# Patient Record
Sex: Female | Born: 1950 | Race: White | Hispanic: No | Marital: Married | Smoking: Never smoker
Health system: Southern US, Community
[De-identification: ages and names within clinical notes are randomized; demographics above are authoritative.]

## PROBLEM LIST (undated history)

## (undated) DIAGNOSIS — I471 Supraventricular tachycardia, unspecified: Secondary | ICD-10-CM

## (undated) DIAGNOSIS — I10 Essential (primary) hypertension: Secondary | ICD-10-CM

## (undated) DIAGNOSIS — R1310 Dysphagia, unspecified: Secondary | ICD-10-CM

## (undated) DIAGNOSIS — M1990 Unspecified osteoarthritis, unspecified site: Secondary | ICD-10-CM

## (undated) DIAGNOSIS — M4802 Spinal stenosis, cervical region: Secondary | ICD-10-CM

## (undated) DIAGNOSIS — T7840XA Allergy, unspecified, initial encounter: Secondary | ICD-10-CM

## (undated) DIAGNOSIS — K219 Gastro-esophageal reflux disease without esophagitis: Secondary | ICD-10-CM

## (undated) DIAGNOSIS — R112 Nausea with vomiting, unspecified: Secondary | ICD-10-CM

## (undated) DIAGNOSIS — Z9889 Other specified postprocedural states: Secondary | ICD-10-CM

## (undated) DIAGNOSIS — M810 Age-related osteoporosis without current pathological fracture: Secondary | ICD-10-CM

## (undated) DIAGNOSIS — E782 Mixed hyperlipidemia: Secondary | ICD-10-CM

## (undated) DIAGNOSIS — M4316 Spondylolisthesis, lumbar region: Secondary | ICD-10-CM

## (undated) HISTORY — DX: Mixed hyperlipidemia: E78.2

## (undated) HISTORY — PX: ABDOMINAL HYSTERECTOMY: SHX81

## (undated) HISTORY — PX: COLONOSCOPY: SHX174

## (undated) HISTORY — DX: Age-related osteoporosis without current pathological fracture: M81.0

## (undated) HISTORY — DX: Dysphagia, unspecified: R13.10

## (undated) HISTORY — DX: Essential (primary) hypertension: I10

## (undated) HISTORY — DX: Spinal stenosis, cervical region: M48.02

## (undated) HISTORY — DX: Allergy, unspecified, initial encounter: T78.40XA

## (undated) HISTORY — PX: NO PAST SURGERIES: SHX2092

---

## 2003-08-14 HISTORY — PX: ESOPHAGEAL DILATION: SHX303

## 2013-06-24 DIAGNOSIS — M25559 Pain in unspecified hip: Secondary | ICD-10-CM | POA: Insufficient documentation

## 2013-06-24 DIAGNOSIS — M48061 Spinal stenosis, lumbar region without neurogenic claudication: Secondary | ICD-10-CM | POA: Diagnosis present

## 2016-01-17 ENCOUNTER — Other Ambulatory Visit: Payer: Self-pay | Admitting: Urology

## 2016-02-24 ENCOUNTER — Other Ambulatory Visit: Payer: Self-pay | Admitting: Obstetrics and Gynecology

## 2016-03-14 NOTE — Patient Instructions (Addendum)
Crystal Oneal  03/14/2016   Your procedure is scheduled on: 03/20/2016    Report to [facility]  Entrance take [place]  elevators to 3rd floor to  [place] at    [place] AM.  Call this number if you have problems the morning of surgery [phone]   Remember: ONLY 1 PERSON MAY GO WITH YOU TO SHORT STAY TO GET  READY MORNING OF [name]  Do not eat food or drink liquids :After Midnight.              Fleets enema nite before surgery      Take these medicines the morning of surgery with A SIP OF WATER: none                                 You may not have any metal on your body including hair pins and              piercings  Do not wear jewelry, make-up, lotions, powders or perfumes, deodorant             Do not wear nail polish.  Do not shave  48 hours prior to surgery.                 Do not bring valuables to the hospital. [place].  Contacts, dentures or bridgework may not be worn into surgery.  Leave suitcase in the car. After surgery it may be brought to your room.   _____________________________________________________________________             [facility] - Preparing for Surgery Before surgery, you can play an important role.  Because skin is not sterile, your skin needs to be as free of germs as possible.  You can reduce the number of germs on your skin by washing with CHG (chlorahexidine gluconate) soap before surgery.  CHG is an antiseptic cleaner which kills germs and bonds with the skin to continue killing germs even after washing. Please DO NOT use if you have an allergy to CHG or antibacterial soaps.  If your skin becomes reddened/irritated stop using the CHG and inform your nurse when you arrive at Short Stay. Do not shave (including legs and underarms) for at least 48 hours prior to the first CHG shower.  You may shave your face/neck. Please follow these instructions  carefully:  1.  Shower with CHG Soap the night before surgery and the  morning of Surgery.  2.  If you choose to wash your hair, wash your hair first as usual with your  normal  shampoo.  3.  After you shampoo, rinse your hair and body thoroughly to remove the  shampoo.                           4.  Use CHG as you would any other liquid soap.  You can apply chg directly  to the skin and wash                       Gently with a scrungie or clean washcloth.  5.  Apply the CHG Soap to your body ONLY FROM THE  NECK DOWN.   Do not use on face/ open                           Wound or open sores. Avoid contact with eyes, ears mouth and genitals (private parts).                       Wash face,  Genitals (private parts) with your normal soap.             6.  Wash thoroughly, paying special attention to the area where your surgery  will be performed.  7.  Thoroughly rinse your body with warm water from the neck down.  8.  DO NOT shower/wash with your normal soap after using and rinsing off  the CHG Soap.                9.  Pat yourself dry with a clean towel.            10.  Wear clean pajamas.            11.  Place clean sheets on your bed the night of your first shower and do not  sleep with pets. Day of Surgery : Do not apply any lotions/deodorants the morning of surgery.  Please wear clean clothes to the hospital/surgery center.  FAILURE TO FOLLOW THESE INSTRUCTIONS MAY RESULT IN THE CANCELLATION OF YOUR SURGERY PATIENT SIGNATURE_________________________________  NURSE SIGNATURE__________________________________  ________________________________________________________________________  WHAT IS A BLOOD TRANSFUSION? Blood Transfusion Information  A transfusion is the replacement of blood or some of its parts. Blood is made up of multiple cells which provide different functions.  Red blood cells carry oxygen and are used for blood loss replacement.  White blood cells fight against  infection.  Platelets control bleeding.  Plasma helps clot blood.  Other blood products are available for specialized needs, such as hemophilia or other clotting disorders. BEFORE THE TRANSFUSION  Who gives blood for transfusions?   Healthy volunteers who are fully evaluated to make sure their blood is safe. This is blood bank blood. Transfusion therapy is the safest it has ever been in the practice of medicine. Before blood is taken from a donor, a complete history is taken to make sure that person has no history of diseases nor engages in risky social behavior (examples are intravenous drug use or sexual activity with multiple partners). The donor's travel history is screened to minimize risk of transmitting infections, such as malaria. The donated blood is tested for signs of infectious diseases, such as HIV and hepatitis. The blood is then tested to be sure it is compatible with you in order to minimize the chance of a transfusion reaction. If you or a relative donates blood, this is often done in anticipation of surgery and is not appropriate for emergency situations. It takes many days to process the donated blood. RISKS AND COMPLICATIONS Although transfusion therapy is very safe and saves many lives, the main dangers of transfusion include:   Getting an infectious disease.  Developing a transfusion reaction. This is an allergic reaction to something in the blood you were given. Every precaution is taken to prevent this. The decision to have a blood transfusion has been considered carefully by your caregiver before blood is given. Blood is not given unless the benefits outweigh the risks. AFTER THE TRANSFUSION  Right after receiving a blood transfusion, you will usually feel much better and more  energetic. This is especially true if your red blood cells have gotten low (anemic). The transfusion raises the level of the red blood cells which carry oxygen, and this usually causes an energy  increase.  The nurse administering the transfusion will monitor you carefully for complications. HOME CARE INSTRUCTIONS  No special instructions are needed after a transfusion. You may find your energy is better. Speak with your caregiver about any limitations on activity for underlying diseases you may have. SEEK MEDICAL CARE IF:   Your condition is not improving after your transfusion.  You develop redness or irritation at the intravenous (IV) site. SEEK IMMEDIATE MEDICAL CARE IF:  Any of the following symptoms occur over the next 12 hours:  Shaking chills.  You have a temperature by mouth above 102 F (38.9 C), not controlled by medicine.  Chest, back, or muscle pain.  People around you feel you are not acting correctly or are confused.  Shortness of breath or difficulty breathing.  Dizziness and fainting.  You get a rash or develop hives.  You have a decrease in urine output.  Your urine turns a dark color or changes to pink, red, or brown. Any of the following symptoms occur over the next 10 days:  You have a temperature by mouth above 102 F (38.9 C), not controlled by medicine.  Shortness of breath.  Weakness after normal activity.  The white part of the eye turns yellow (jaundice).  You have a decrease in the amount of urine or are urinating less often.  Your urine turns a dark color or changes to pink, red, or brown. Document Released: 07/27/2000 Document Revised: 10/22/2011 Document Reviewed: 03/15/2008 [facility] Patient Information 2014 [place], [place].  _______________________________________________________________________

## 2016-03-15 ENCOUNTER — Encounter (HOSPITAL_COMMUNITY)
Admission: RE | Admit: 2016-03-15 | Discharge: 2016-03-15 | Disposition: A | Discharge status: Home / Self Care | Payer: 59 | Source: Ambulatory Visit | Attending: Urology | Admitting: Urology

## 2016-03-15 ENCOUNTER — Encounter (HOSPITAL_COMMUNITY): Payer: Self-pay

## 2016-03-15 DIAGNOSIS — Z0183 Encounter for blood typing: Secondary | ICD-10-CM | POA: Insufficient documentation

## 2016-03-15 DIAGNOSIS — Z01812 Encounter for preprocedural laboratory examination: Secondary | ICD-10-CM | POA: Diagnosis present

## 2016-03-15 DIAGNOSIS — N814 Uterovaginal prolapse, unspecified: Secondary | ICD-10-CM | POA: Diagnosis not present

## 2016-03-15 HISTORY — DX: Unspecified osteoarthritis, unspecified site: M19.90

## 2016-03-15 HISTORY — DX: Other specified postprocedural states: Z98.890

## 2016-03-15 HISTORY — DX: Nausea with vomiting, unspecified: R11.2

## 2016-03-15 LAB — CBC
HCT: 41.8 % (ref 36.0–46.0)
Hemoglobin: 13.9 g/dL (ref 12.0–15.0)
MCH: 29.7 pg (ref 26.0–34.0)
MCHC: 33.3 g/dL (ref 30.0–36.0)
MCV: 89.3 fL (ref 78.0–100.0)
PLATELETS: 234 10*3/uL (ref 150–400)
RBC: 4.68 MIL/uL (ref 3.87–5.11)
RDW: 12.8 % (ref 11.5–15.5)
WBC: 3.8 10*3/uL — ABNORMAL LOW (ref 4.0–10.5)

## 2016-03-15 LAB — PROTIME-INR
INR: 0.95
Prothrombin Time: 12.7 seconds (ref 11.4–15.2)

## 2016-03-15 LAB — BASIC METABOLIC PANEL
Anion gap: 6 (ref 5–15)
BUN: 18 mg/dL (ref 6–20)
CALCIUM: 10 mg/dL (ref 8.9–10.3)
CHLORIDE: 103 mmol/L (ref 101–111)
CO2: 28 mmol/L (ref 22–32)
CREATININE: 0.73 mg/dL (ref 0.44–1.00)
GFR calc non Af Amer: >60 mL/min (ref >60)
Glucose, Bld: 100 mg/dL — ABNORMAL HIGH (ref 65–99)
Potassium: 5 mmol/L (ref 3.5–5.1)
SODIUM: 137 mmol/L (ref 135–145)

## 2016-03-15 LAB — APTT: aPTT: 25 seconds (ref 24–36)

## 2016-03-15 LAB — ABO/RH: ABO/RH(D): O POS

## 2016-03-19 MED ORDER — DEXTROSE 5 % IV SOLN
5.0000 mg/kg | INTRAVENOUS | Status: AC
Start: 2016-03-20 — End: 2016-03-20
  Administered 2016-03-20: 260 mg via INTRAVENOUS
  Filled 2016-03-19: qty 6.5

## 2016-03-19 NOTE — Anesthesia Preprocedure Evaluation (Addendum)
Anesthesia Evaluation  Patient identified by MRN, date of birth, ID band Patient awake    Reviewed: Allergy & Precautions, NPO status , Patient's Chart, lab work & pertinent test results  History of Anesthesia Complications (+) PONV  Airway Mallampati: II  TM Distance: >3 FB Neck ROM: Full    Dental  (+) Dental Advisory Given   Pulmonary neg pulmonary ROS,    breath sounds clear to auscultation       Cardiovascular negative cardio ROS   Rhythm:Regular Rate:Normal     Neuro/Psych negative neurological ROS     GI/Hepatic negative GI ROS, Neg liver ROS,   Endo/Other  negative endocrine ROS  Renal/GU negative Renal ROS     Musculoskeletal  (+) Arthritis ,   Abdominal   Peds  Hematology negative hematology ROS (+)   Anesthesia Other Findings   Reproductive/Obstetrics                            Lab Results  Component Value Date   WBC 3.8 (L) 03/15/2016   HGB 13.9 03/15/2016   HCT 41.8 03/15/2016   MCV 89.3 03/15/2016   PLT 234 03/15/2016   Lab Results  Component Value Date   CREATININE 0.73 03/15/2016   BUN 18 03/15/2016   NA 137 03/15/2016   K 5.0 03/15/2016   CL 103 03/15/2016   CO2 28 03/15/2016    Anesthesia Physical Anesthesia Plan  ASA: I  Anesthesia Plan: General   Post-op Pain Management:    Induction: Intravenous  Airway Management Planned: Oral ETT  Additional Equipment:   Intra-op Plan:   Post-operative Plan: Extubation in OR  Informed Consent: I have reviewed the patients History and Physical, chart, labs and discussed the procedure including the risks, benefits and alternatives for the proposed anesthesia with the patient or authorized representative who has indicated his/her understanding and acceptance.   Dental advisory given  Plan Discussed with: CRNA  Anesthesia Plan Comments:       Anesthesia Quick Evaluation

## 2016-03-19 NOTE — H&P (Signed)
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 She has uncommon urge incontinence and she time voids to try to minimize this. She may have rare stress incontinence. Many days she does not wear a pad. She wears 1 pad on a bad day.  She voids every hour but can sit through a 2-hour movie. She gets up once or twice at night. She reports a good flow. Sometimes she has to hesitate before she goes. She does feel empty.  Crystal Glascoe has a small grade 3 cystocele and her cervix descends 3 cm and a grade 2 rectocele. I thought if she ever had surgery, she would likely best benefit from a transvaginal hysterectomy with a vault suspension, cystocele repair and graft, and a rectocele repair. She did not have stress incontinence on her urodynamics but did have an overactive bladder noted. I gave her Premarin cream for dyspareunia and trimethoprim for chronic cystitis. She was infection free on trimethoprim and may have had a little bit of spotting on the Premarin. She had a little bit of fullness on the right on CT scan and more fullness on the left with a dilated left ureter in the proximal few centimeters and almost an S turn or low ureteropelvic junction obstruction. Dr Leonia Reeves had read the CT scan and thought it was within normal limits.  Past Medical History Problems  1. History of arthritis (Z87.39) 2. History of esophageal reflux (Z87.19) Surgical History Problems  1. History of No Surgical Problems Current Meds 1. Premarin 0.625 MG/GM Vaginal Cream; one gram 3x/week x 4 weeks; then once weekly;; Therapy: [id number] to (Last Rx:13Mar2017) Ordered 2. Trimethoprim 100 MG Oral Tablet; Take 1 tablet daily; Therapy: [id number] to (Evaluate:08Mar2018); Last Rx:13Mar2017 Ordered Allergies Medication  1. Sulfa Drugs Family History Problems  1. Family history of malignant neoplasm (Z80.9) : Mother Social  History Problems  1. Never smoker Assessment Assessed  1. Urge and stress incontinence (N39.46) 2. Cystocele, midline (N81.11) Plan  Cystocele, midline  1. Follow-up Schedule Surgery Office Follow-up Status: Complete Done: [id number] Discussion/Summary Infection free on trimethoprim. Vaginal dryness and pain improved on Premarin. Dr Garwin Brothers cleared her for spotting and discussed a hysterectomy. She is sexually active.  I talked about a transvaginal hysterectomy with a vault suspension, cystocele repair and graft, and a rectocele repair.  I drew her a picture and we talked about prolapse surgery in detail. Pros, cons, general surgical and anesthetic risks, and other options including behavioral therapy, pessaries, and watchful waiting were discussed. She understands that prolapse repairs are successful in 80-85% of cases for prolapse symptoms and can recur anteriorly, posteriorly, and/or apically. She understands that in most cases I use a graft and general risks were discussed. Surgical risks were described but not limited to the discussion of injury to neighboring structures including the bowel (with possible life-threatening sepsis and colostomy), bladder, urethra, vagina (all resulting in further surgery), and ureter (resulting in re-implantation). We talked about injury to nerves/soft tissue leading to debilitating and intractable pelvic, abdominal, and lower extremity pain syndromes and neuropathies. The risks of buttock pain, intractable dyspareunia, and vaginal narrowing and shortening with sequelae were discussed. Bleeding risks, transfusion rates, and infection were discussed. The risk of persistent, de novo, or worsening bladder and/or bowel incontinence/dysfunction was discussed. The need for CIC was described as well the usual post-operative course. The patient understands that she might not reach her treatment goal  and that she might be worse following surgery. The role of medication versus  surgery in treating her overactive bladder. Mesh issues described. Worsening incontinence described. She would like to proceed and hopefully she will reach her treatment goal.   After a thorough review of the management options for the patient's condition the patient  elected to proceed with surgical therapy as noted above. We have discussed the potential benefits and risks of the procedure, side effects of the proposed treatment, the likelihood of the patient achieving the goals of the procedure, and any potential problems that might occur during the procedure or recuperation. Informed consent has been obtained.

## 2016-03-20 ENCOUNTER — Encounter (HOSPITAL_COMMUNITY): Payer: Self-pay | Admitting: *Deleted

## 2016-03-20 ENCOUNTER — Observation Stay (HOSPITAL_COMMUNITY)
Admission: RE | Admit: 2016-03-20 | Discharge: 2016-03-21 | Disposition: A | Discharge status: Home / Self Care | Payer: 59 | Source: Ambulatory Visit | Attending: Urology | Admitting: Urology

## 2016-03-20 ENCOUNTER — Ambulatory Visit (HOSPITAL_COMMUNITY): Payer: 59

## 2016-03-20 ENCOUNTER — Ambulatory Visit (HOSPITAL_COMMUNITY): Payer: 59 | Admitting: Certified Registered Nurse Anesthetist

## 2016-03-20 ENCOUNTER — Encounter (HOSPITAL_COMMUNITY)
Admission: RE | Disposition: A | Discharge status: Home / Self Care | Payer: Self-pay | Source: Ambulatory Visit | Attending: Urology

## 2016-03-20 DIAGNOSIS — Z09 Encounter for follow-up examination after completed treatment for conditions other than malignant neoplasm: Secondary | ICD-10-CM

## 2016-03-20 DIAGNOSIS — N814 Uterovaginal prolapse, unspecified: Principal | ICD-10-CM | POA: Insufficient documentation

## 2016-03-20 DIAGNOSIS — Z79890 Hormone replacement therapy (postmenopausal): Secondary | ICD-10-CM | POA: Diagnosis not present

## 2016-03-20 DIAGNOSIS — IMO0002 Reserved for concepts with insufficient information to code with codable children: Secondary | ICD-10-CM | POA: Diagnosis present

## 2016-03-20 HISTORY — PX: ANTERIOR AND POSTERIOR REPAIR: SHX5121

## 2016-03-20 HISTORY — PX: VAGINAL PROLAPSE REPAIR: SHX830

## 2016-03-20 HISTORY — PX: VAGINAL HYSTERECTOMY: SHX2639

## 2016-03-20 LAB — HEMOGLOBIN AND HEMATOCRIT, BLOOD
HEMATOCRIT: 34.6 % — AB (ref 36.0–46.0)
HEMOGLOBIN: 11.8 g/dL — AB (ref 12.0–15.0)

## 2016-03-20 LAB — TYPE AND SCREEN
ABO/RH(D): O POS
ANTIBODY SCREEN: NEGATIVE

## 2016-03-20 SURGERY — ANTERIOR (CYSTOCELE) AND POSTERIOR REPAIR (RECTOCELE)
Anesthesia: General | Site: Vagina

## 2016-03-20 MED ORDER — SCOPOLAMINE 1 MG/3DAYS TD PT72
MEDICATED_PATCH | TRANSDERMAL | Status: AC
  Filled 2016-03-20: qty 1

## 2016-03-20 MED ORDER — SUFENTANIL CITRATE 50 MCG/ML IV SOLN
INTRAVENOUS | Status: AC
  Filled 2016-03-20: qty 1

## 2016-03-20 MED ORDER — CEFAZOLIN SODIUM-DEXTROSE 2-4 GM/100ML-% IV SOLN
INTRAVENOUS | Status: AC
  Filled 2016-03-20: qty 100

## 2016-03-20 MED ORDER — MIDAZOLAM HCL 2 MG/2ML IJ SOLN
INTRAMUSCULAR | Status: AC
Start: 2016-03-20 — End: 2016-03-20
  Filled 2016-03-20: qty 2

## 2016-03-20 MED ORDER — SUFENTANIL CITRATE 50 MCG/ML IV SOLN
INTRAVENOUS | Status: DC | PRN
  Administered 2016-03-20: 5 ug via INTRAVENOUS
  Administered 2016-03-20: 10 ug via INTRAVENOUS
  Administered 2016-03-20 (×3): 5 ug via INTRAVENOUS
  Administered 2016-03-20: 10 ug via INTRAVENOUS
  Administered 2016-03-20: 5 ug via INTRAVENOUS

## 2016-03-20 MED ORDER — SCOPOLAMINE 1 MG/3DAYS TD PT72
MEDICATED_PATCH | TRANSDERMAL | Status: DC | PRN
  Administered 2016-03-20: 1 via TRANSDERMAL

## 2016-03-20 MED ORDER — DEXAMETHASONE SODIUM PHOSPHATE 10 MG/ML IJ SOLN
INTRAMUSCULAR | Status: DC | PRN
  Administered 2016-03-20: 10 mg via INTRAVENOUS

## 2016-03-20 MED ORDER — HYDROCODONE-ACETAMINOPHEN 5-325 MG PO TABS: 1.0000 | ORAL_TABLET | Freq: Four times a day (QID) | ORAL | 0 refills | Status: DC | PRN

## 2016-03-20 MED ORDER — SUGAMMADEX SODIUM 200 MG/2ML IV SOLN
INTRAVENOUS | Status: DC | PRN
  Administered 2016-03-20: 150 mg via INTRAVENOUS

## 2016-03-20 MED ORDER — HYDROCODONE-ACETAMINOPHEN 5-325 MG PO TABS
1.0000 | ORAL_TABLET | ORAL | Status: DC | PRN
  Administered 2016-03-20 (×3): 1 via ORAL
  Administered 2016-03-21 (×2): 2 via ORAL
  Filled 2016-03-20: qty 2
  Filled 2016-03-20 (×2): qty 1
  Filled 2016-03-20: qty 2
  Filled 2016-03-20: qty 1

## 2016-03-20 MED ORDER — ROCURONIUM BROMIDE 100 MG/10ML IV SOLN
INTRAVENOUS | Status: AC
Start: 2016-03-20 — End: 2016-03-20
  Filled 2016-03-20: qty 1

## 2016-03-20 MED ORDER — DEXAMETHASONE SODIUM PHOSPHATE 10 MG/ML IJ SOLN
INTRAMUSCULAR | Status: AC
Start: 2016-03-20 — End: 2016-03-20
  Filled 2016-03-20: qty 1

## 2016-03-20 MED ORDER — ONDANSETRON HCL 4 MG/2ML IJ SOLN
INTRAMUSCULAR | Status: DC | PRN
  Administered 2016-03-20: 4 mg via INTRAVENOUS

## 2016-03-20 MED ORDER — POLYMYXIN B SULFATE 500000 UNITS IJ SOLR
INTRAMUSCULAR | Status: DC | PRN
  Administered 2016-03-20: 500 mL

## 2016-03-20 MED ORDER — PROPOFOL 10 MG/ML IV BOLUS
INTRAVENOUS | Status: DC | PRN
  Administered 2016-03-20: 170 mg via INTRAVENOUS
  Administered 2016-03-20: 30 mg via INTRAVENOUS

## 2016-03-20 MED ORDER — DEXTROSE-NACL 5-0.45 % IV SOLN
INTRAVENOUS | Status: DC
  Administered 2016-03-20 – 2016-03-21 (×3): via INTRAVENOUS

## 2016-03-20 MED ORDER — MORPHINE SULFATE (PF) 2 MG/ML IV SOLN
2.0000 mg | INTRAVENOUS | Status: DC | PRN
  Administered 2016-03-20 – 2016-03-21 (×4): 2 mg via INTRAVENOUS
  Filled 2016-03-20 (×4): qty 1

## 2016-03-20 MED ORDER — LIDOCAINE HCL (CARDIAC) 20 MG/ML IV SOLN
INTRAVENOUS | Status: AC
  Filled 2016-03-20: qty 5

## 2016-03-20 MED ORDER — LIDOCAINE-EPINEPHRINE (PF) 1 %-1:200000 IJ SOLN
INTRAMUSCULAR | Status: DC | PRN
  Administered 2016-03-20: 53 mL

## 2016-03-20 MED ORDER — LIDOCAINE HCL (CARDIAC) 20 MG/ML IV SOLN
INTRAVENOUS | Status: DC | PRN
  Administered 2016-03-20: 75 mg via INTRAVENOUS
  Administered 2016-03-20: 25 mg via INTRATRACHEAL

## 2016-03-20 MED ORDER — CEFAZOLIN SODIUM-DEXTROSE 2-4 GM/100ML-% IV SOLN
2.0000 g | INTRAVENOUS | Status: AC
  Administered 2016-03-20: 2 g via INTRAVENOUS
  Filled 2016-03-20: qty 100

## 2016-03-20 MED ORDER — ESTRADIOL 0.1 MG/GM VA CREA
TOPICAL_CREAM | VAGINAL | Status: DC | PRN
  Administered 2016-03-20: 1 via VAGINAL

## 2016-03-20 MED ORDER — ACETAMINOPHEN 10 MG/ML IV SOLN
INTRAVENOUS | Status: AC
  Filled 2016-03-20: qty 100

## 2016-03-20 MED ORDER — PHENYLEPHRINE HCL 10 MG/ML IJ SOLN
INTRAMUSCULAR | Status: AC
  Filled 2016-03-20: qty 1

## 2016-03-20 MED ORDER — METOCLOPRAMIDE HCL 5 MG/ML IJ SOLN
INTRAMUSCULAR | Status: AC
Start: 2016-03-20 — End: 2016-03-20
  Filled 2016-03-20: qty 2

## 2016-03-20 MED ORDER — HYDROMORPHONE HCL 1 MG/ML IJ SOLN
INTRAMUSCULAR | Status: DC | PRN
  Administered 2016-03-20: 1 mg via INTRAVENOUS

## 2016-03-20 MED ORDER — ACETAMINOPHEN 10 MG/ML IV SOLN
INTRAVENOUS | Status: DC | PRN
  Administered 2016-03-20: 1000 mg via INTRAVENOUS

## 2016-03-20 MED ORDER — ACETAMINOPHEN 325 MG PO TABS: 650.0000 mg | ORAL_TABLET | ORAL | Status: DC | PRN

## 2016-03-20 MED ORDER — PROPOFOL 10 MG/ML IV BOLUS
INTRAVENOUS | Status: AC
  Filled 2016-03-20: qty 40

## 2016-03-20 MED ORDER — LIDOCAINE-EPINEPHRINE (PF) 1 %-1:200000 IJ SOLN
INTRAMUSCULAR | Status: AC
  Filled 2016-03-20: qty 60

## 2016-03-20 MED ORDER — MIDAZOLAM HCL 5 MG/5ML IJ SOLN
INTRAMUSCULAR | Status: DC | PRN
  Administered 2016-03-20: 2 mg via INTRAVENOUS

## 2016-03-20 MED ORDER — FLEET ENEMA 7-19 GM/118ML RE ENEM: 1.0000 | ENEMA | Freq: Once | RECTAL | Status: DC

## 2016-03-20 MED ORDER — SODIUM CHLORIDE 0.9 % IR SOLN
Status: AC
  Filled 2016-03-20 (×2): qty 1

## 2016-03-20 MED ORDER — METOCLOPRAMIDE HCL 5 MG/ML IJ SOLN
INTRAMUSCULAR | Status: DC | PRN
  Administered 2016-03-20: 10 mg via INTRAVENOUS

## 2016-03-20 MED ORDER — PHENAZOPYRIDINE HCL 200 MG PO TABS
200.0000 mg | ORAL_TABLET | Freq: Once | ORAL | Status: DC
  Filled 2016-03-20: qty 1

## 2016-03-20 MED ORDER — SODIUM CHLORIDE 0.9 % IJ SOLN
INTRAMUSCULAR | Status: AC
Start: 2016-03-20 — End: 2016-03-20
  Filled 2016-03-20: qty 10

## 2016-03-20 MED ORDER — PROMETHAZINE HCL 25 MG/ML IJ SOLN: 6.2500 mg | INTRAMUSCULAR | Status: DC | PRN

## 2016-03-20 MED ORDER — KETOROLAC TROMETHAMINE 30 MG/ML IJ SOLN: 30.0000 mg | Freq: Once | INTRAMUSCULAR | Status: DC | PRN

## 2016-03-20 MED ORDER — DIPHENHYDRAMINE HCL 12.5 MG/5ML PO ELIX: 12.5000 mg | ORAL_SOLUTION | Freq: Four times a day (QID) | ORAL | Status: DC | PRN

## 2016-03-20 MED ORDER — ONDANSETRON HCL 4 MG/2ML IJ SOLN
INTRAMUSCULAR | Status: AC
  Filled 2016-03-20: qty 2

## 2016-03-20 MED ORDER — SODIUM CHLORIDE 0.9 % IR SOLN
Status: DC | PRN
  Administered 2016-03-20: 1000 mL

## 2016-03-20 MED ORDER — HYDROMORPHONE HCL 1 MG/ML IJ SOLN: 0.2500 mg | INTRAMUSCULAR | Status: DC | PRN

## 2016-03-20 MED ORDER — SUGAMMADEX SODIUM 200 MG/2ML IV SOLN
INTRAVENOUS | Status: AC
Start: 2016-03-20 — End: 2016-03-20
  Filled 2016-03-20: qty 2

## 2016-03-20 MED ORDER — ESTRADIOL 0.1 MG/GM VA CREA
TOPICAL_CREAM | VAGINAL | Status: AC
  Filled 2016-03-20: qty 85

## 2016-03-20 MED ORDER — ONDANSETRON HCL 4 MG/2ML IJ SOLN
4.0000 mg | INTRAMUSCULAR | Status: DC | PRN
  Administered 2016-03-20: 4 mg via INTRAVENOUS
  Filled 2016-03-20: qty 2

## 2016-03-20 MED ORDER — DIPHENHYDRAMINE HCL 50 MG/ML IJ SOLN: 12.5000 mg | Freq: Four times a day (QID) | INTRAMUSCULAR | Status: DC | PRN

## 2016-03-20 MED ORDER — PHENYLEPHRINE HCL 10 MG/ML IJ SOLN
INTRAVENOUS | Status: DC | PRN
  Administered 2016-03-20: 30 ug/min via INTRAVENOUS

## 2016-03-20 MED ORDER — HYDROMORPHONE HCL 2 MG/ML IJ SOLN
INTRAMUSCULAR | Status: AC
  Filled 2016-03-20: qty 1

## 2016-03-20 MED ORDER — ROCURONIUM BROMIDE 100 MG/10ML IV SOLN
INTRAVENOUS | Status: DC | PRN
  Administered 2016-03-20 (×2): 10 mg via INTRAVENOUS
  Administered 2016-03-20: 20 mg via INTRAVENOUS
  Administered 2016-03-20: 60 mg via INTRAVENOUS

## 2016-03-20 MED ORDER — LACTATED RINGERS IV SOLN
INTRAVENOUS | Status: DC | PRN
  Administered 2016-03-20 (×4): via INTRAVENOUS

## 2016-03-20 SURGICAL SUPPLY — 64 items
ALLOGRAFT TUTOPLAST AXIS 6X12 (Tissue) ×4 IMPLANT
BAG URINE DRAINAGE (UROLOGICAL SUPPLIES) ×5 IMPLANT
BLADE SURG 15 STRL LF DISP TIS (BLADE) ×4 IMPLANT
BLADE SURG 15 STRL SS (BLADE) ×1
CATH FOLEY 2WAY SLVR  5CC 14FR (CATHETERS) ×1
CATH FOLEY 2WAY SLVR 5CC 14FR (CATHETERS) ×4 IMPLANT
CLOTH BEACON ORANGE TIMEOUT ST (SAFETY) ×5 IMPLANT
COVER MAYO STAND STRL (DRAPES) ×5 IMPLANT
COVER SURGICAL LIGHT HANDLE (MISCELLANEOUS) ×5 IMPLANT
DECANTER SPIKE VIAL GLASS SM (MISCELLANEOUS) ×5 IMPLANT
DEVICE CAPIO SLIM SINGLE (INSTRUMENTS) ×5 IMPLANT
DRAIN PENROSE 18X1/4 LTX STRL (WOUND CARE) ×5 IMPLANT
DRAPE SHEET LG 3/4 BI-LAMINATE (DRAPES) ×5 IMPLANT
DRAPE STERI URO 9X17 APER PCH (DRAPES) ×5 IMPLANT
ELECT LIGASURE LONG (ELECTRODE) IMPLANT
ELECT LIGASURE SHORT 9 REUSE (ELECTRODE) ×5 IMPLANT
ELECT PENCIL ROCKER SW 15FT (MISCELLANEOUS) ×5 IMPLANT
GAUZE PACKING 2X5 YD STRL (GAUZE/BANDAGES/DRESSINGS) ×5 IMPLANT
GAUZE SPONGE 4X4 16PLY XRAY LF (GAUZE/BANDAGES/DRESSINGS) ×10 IMPLANT
GLOVE BIO SURGEON STRL SZ 6.5 (GLOVE) ×5 IMPLANT
GLOVE BIOGEL M STRL SZ7.5 (GLOVE) ×5 IMPLANT
GLOVE BIOGEL PI IND STRL 7.0 (GLOVE) ×4 IMPLANT
GLOVE BIOGEL PI INDICATOR 7.0 (GLOVE) ×1
GLOVE ECLIPSE 6.5 STRL STRAW (GLOVE) ×5 IMPLANT
GLOVE ECLIPSE 8.5 STRL (GLOVE) ×5 IMPLANT
GOWN STRL REUS W/TWL XL LVL3 (GOWN DISPOSABLE) ×20 IMPLANT
HOLDER FOLEY CATH W/STRAP (MISCELLANEOUS) ×5 IMPLANT
IV NS 1000ML (IV SOLUTION) ×1
IV NS 1000ML BAXH (IV SOLUTION) ×4 IMPLANT
KIT BASIN OR (CUSTOM PROCEDURE TRAY) ×5 IMPLANT
MANIFOLD NEPTUNE II (INSTRUMENTS) ×5 IMPLANT
NEEDLE HYPO 22GX1.5 SAFETY (NEEDLE) ×5 IMPLANT
NEEDLE MAYO 6 CRC TAPER PT (NEEDLE) ×5 IMPLANT
NEEDLE SPNL 22GX3.5 QUINCKE BK (NEEDLE) ×5 IMPLANT
NS IRRIG 1000ML POUR BTL (IV SOLUTION) ×10 IMPLANT
PACK CYSTO (CUSTOM PROCEDURE TRAY) IMPLANT
PACK GENERAL/GYN (CUSTOM PROCEDURE TRAY) ×5 IMPLANT
PAD OB MATERNITY 4.3X12.25 (PERSONAL CARE ITEMS) ×5 IMPLANT
PLUG CATH AND CAP STER (CATHETERS) ×5 IMPLANT
RETRACTOR STAY HOOK 5MM (MISCELLANEOUS) ×5 IMPLANT
SHEET LAVH (DRAPES) ×5 IMPLANT
SPONGE LAP 4X18 X RAY DECT (DISPOSABLE) ×5 IMPLANT
SUT CAPIO ETHIBPND (SUTURE) ×10 IMPLANT
SUT VIC AB 0 CT1 18XCR BRD 8 (SUTURE) ×4 IMPLANT
SUT VIC AB 0 CT1 27 (SUTURE) ×4
SUT VIC AB 0 CT1 27XBRD ANTBC (SUTURE) ×16 IMPLANT
SUT VIC AB 0 CT1 36 (SUTURE) IMPLANT
SUT VIC AB 0 CT1 8-18 (SUTURE) ×1
SUT VIC AB 2-0 CT1 27 (SUTURE) ×2
SUT VIC AB 2-0 CT1 27XBRD (SUTURE) ×8 IMPLANT
SUT VIC AB 2-0 SH 27 (SUTURE) ×5
SUT VIC AB 2-0 SH 27X BRD (SUTURE) ×20 IMPLANT
SUT VIC AB 3-0 SH 27 (SUTURE) ×3
SUT VIC AB 3-0 SH 27XBRD (SUTURE) ×12 IMPLANT
SUT VICRYL 0 UR6 27IN ABS (SUTURE) ×10 IMPLANT
SYRINGE 10CC LL (SYRINGE) ×5 IMPLANT
TOWEL OR 17X26 10 PK STRL BLUE (TOWEL DISPOSABLE) ×15 IMPLANT
TOWEL OR NON WOVEN STRL DISP B (DISPOSABLE) ×5 IMPLANT
TRAY FOLEY CATH 16FR SILVER (SET/KITS/TRAYS/PACK) ×5 IMPLANT
TUBING CONNECTING 10 (TUBING) ×5 IMPLANT
TUTOPLAST AXIS 6X12 (Tissue) ×5 IMPLANT
WATER STERILE IRR 1000ML POUR (IV SOLUTION) IMPLANT
WATER STERILE IRR 1500ML POUR (IV SOLUTION) ×5 IMPLANT
YANKAUER SUCT BULB TIP 10FT TU (MISCELLANEOUS) ×5 IMPLANT

## 2016-03-20 NOTE — Brief Op Note (Signed)
03/20/2016  6:33 PM  PATIENT:  Crystal Oneal  65 y.o. female  PRE-OPERATIVE DIAGNOSIS:  CYSTOCELE With uterine prolapse, RECTOCELE, VAULT PROLAPSE  POST-OPERATIVE DIAGNOSIS:  Cystocele with uterine prolapse, rectocele, vault  prolapse  PROCEDURE:  TVHBSO, [place] cul de plasty,  SURGEON:  Surgeon(s) and Role: Panel 1:    * Bjorn Loser, MD - Primary  Panel 2:    * Servando Salina, MD - Primary  PHYSICIAN ASSISTANT:   ASSISTANTS: Dr Nicki Reaper MacDiarmid   ANESTHESIA:   general Findings; rectocele, cystocele, uterine prolapse, elongated tubes, nl ovaries EBL:  Total I/O In: 3935.4 [I.V.:3935.4] Out: 450 [Urine:300; Blood:150]  BLOOD ADMINISTERED:none  DRAINS: none   LOCAL MEDICATIONS USED:  LIDOCAINE   SPECIMEN:  Source of Specimen:  uterus with cervix,tubes and ovaries  DISPOSITION OF SPECIMEN:  PATHOLOGY  COUNTS:  YES  TOURNIQUET:  * No tourniquets in log *  DICTATION: .[id number]  PLAN OF CARE: Admit for overnight observation  PATIENT DISPOSITION:  PACU - hemodynamically stable.   Delay start of Pharmacological VTE agent (>24hrs) due to surgical blood loss or risk of bleeding: no

## 2016-03-20 NOTE — Progress Notes (Signed)
Vitals and labs OK Minimal pain No nerve pain Minimal nausea Ambulate

## 2016-03-20 NOTE — Op Note (Signed)
Preoperative diagnosis: Vault prolapse and cystocele and rectocele Postoperative diagnosis: Vault prolapse and cystocele and rectocele Surgery: Vault prolapse repair and cystocele repair and graft and rectocele repair and cystoscopy Surgeon: Dr. Nicki Reaper Shonette Rhames Asst.: Dr. Servando Salina  The patient has the above diagnoses and consented to the above procedure. I assisted Dr. cousins in performing a transvaginal hysterectomy and removal of her ovaries. The vaginal cuff was left open. The ureteral sacral ligaments were tagged. The vaginal cuff posteriorly had been run for hemostasis.  I initially cystoscoped the patient and she had excellent efflux of yellow urine bilaterally from pyridium  She had loss of support of her vaginal cuff and weakness of the ureteral sacral ligaments. She had a small grade 3 cystocele with central defect and a moderate grade 2 rectocele with a lot of elasticity and weakness of tissues.  I instilled 25 mL of a lidocaine epinephrine mixture anteriorly. Between Allis clamps I made my usual long anterior vaginal wall incision and mobilized the overlying anterior vaginal wall from the underlying pubocervical fascia to the white line bilaterally. I was happy with my mobilization at the apex.  I did a 2 layer anterior repair with running 2-0 Vicryl not distorting the anatomy and not imbricating the bladder neck  I cystoscoped the patient and she had excellent efflux and cystoscopically had a nice repair with no bladder injury  In the appropriate plane I dissected down to the ischial spine and mobilized the soft tissue medially. With a Capio device I placed a 0 Ethibond 1 full finger breath medial to the spine in a straight line between the spines. I triple checked the position. I did a rectal examination. I was very pleased with the position of the sutures and there is no injury to the rectum  With my usual technique I placed a 0 Vicryl into the sidewall at the level of  the urethrovesical angle. I had prepared a 10 x 6 dermal graft in the shape of a trapezoid and sewed it in place tension-free  An appropriate amount of anterior vaginal wall mucosa was trimmed and I closed the anterior vaginal wall with running 2-0 Vicryl on a CT1 needle  Dr. cousins closed the cuff and plicated the ligaments  I placed the Allis clamp on the hymenal ring posteriorly and removed a small triangle of perineal skin. I instilled 20 mL of a lidocaine epinephrine mixture. I made a long posterior vaginal wall incision and sharply mobilized the overlying thin vaginal wall mucosa from the underlying rectovaginal fascia. I was pleased with the mobilization. On rectal examination she had diffuse thinning. She did have some shortening of the posterior vaginal wall relative to the anterior wall   I did a 2 layer posterior repair anatomically with 2-0 Vicryl. I repeated the rectal examination and is very pleased with the repair with no injury  I trimmed only a few millimeters of posterior vaginal wall mucosa and closed the posterior vaginal wall with running 2-0 Vicryl on a CT1 needle. A gentle perineal repair was done with 0 Vicryl interrupted. I exteriorize my running suture and closed the perineal skin with the same 2-0 Vicryl suture.  Vaginal pack with Estrace cream was a applied  Blood loss was less than 100 mL. Leg position was excellent. She had excellent length and no narrowing.  When I used a Capio device on the right side the small titanium tip was cut by the Capio device and in my opinion it was left in the  patient's body. It is placed in the ligament as described above and is much too small to readily fine or palpate. It is made of titanium  For this reason we performed AP x-rays even at magnified resolution. I was very pleased with the imaging. I could not see the approximate 4 mm tip. It is made of titanium and will be left within her pelvis if present. The tip would be the near the  ischial spine. In my opinion it is not clinically relevant and I will speak to the family regarding it.

## 2016-03-20 NOTE — Transfer of Care (Signed)
Immediate Anesthesia Transfer of Care Note  Patient: Crystal Oneal  Procedure(s) Performed: Procedure(s): REPAIR CYSTOCELE AND RECTOCELE (N/A) REPAIR VAULT PROLAPSE AND GRAFT (N/A) TOTAL VAGINAL HYSTERECTOMY WITH BILATERAL SALPINGO OOPHORECTOMY mccall coloplasty (Bilateral) (RADIOFREQUENCY) ABLATION  Patient Location: PACU  Anesthesia Type:General  Level of Consciousness: awake, alert , oriented and patient cooperative  Airway & Oxygen Therapy: Patient Spontanous Breathing and Patient connected to face mask oxygen  Post-op Assessment: Report given to RN, Post -op Vital signs reviewed and stable and Patient moving all extremities X 4  Post vital signs: stable  Last Vitals:  Vitals:   03/20/16 0538  BP: (!) 150/85  Pulse: 79  Resp: 18  Temp: 36.4 C    Last Pain:  Vitals:   03/20/16 0538  TempSrc: Oral      Patients Stated Pain Goal: 2 ([id number])  Complications: No apparent anesthesia complications

## 2016-03-20 NOTE — Anesthesia Postprocedure Evaluation (Signed)
Anesthesia Post Note  Patient: Crystal Oneal  Procedure(s) Performed: Procedure(s) (LRB): REPAIR CYSTOCELE AND RECTOCELE (N/A) REPAIR VAULT PROLAPSE AND GRAFT (N/A) TOTAL VAGINAL HYSTERECTOMY WITH BILATERAL SALPINGO OOPHORECTOMY mccall coloplasty (Bilateral) (RADIOFREQUENCY) ABLATION  Patient location during evaluation: PACU Anesthesia Type: General Level of consciousness: awake and alert Pain management: pain level controlled Vital Signs Assessment: post-procedure vital signs reviewed and stable Respiratory status: spontaneous breathing, nonlabored ventilation, respiratory function stable and patient connected to nasal cannula oxygen Cardiovascular status: blood pressure returned to baseline and stable Postop Assessment: no signs of nausea or vomiting Anesthetic complications: no    Last Vitals:  Vitals:   03/20/16 1300 03/20/16 1322  BP: 109/66 117/63  Pulse: 82 91  Resp: 10 18  Temp: 36.7 C 36.6 C    Last Pain:  Vitals:   03/20/16 1726  TempSrc:   PainSc: 6                  Tiajuana Amass

## 2016-03-20 NOTE — Interval H&P Note (Signed)
History and Physical Interval Note:  03/20/2016 7:08 AM  Crystal Oneal  has presented today for surgery, with the diagnosis of CYSTOCELE, RECTOCELE, VAULT PROLASPE  The various methods of treatment have been discussed with the patient and family. After consideration of risks, benefits and other options for treatment, the patient has consented to  Procedure(s): REPAIR CYSTOCELE AND RECTOCELE (N/A) REPAIR VAULT PROLAPSE AND GRAFT (N/A) TOTAL VAGINAL HYSTERECTOMY WITH BILATERAL SALPINGO OOPHORECTOMY (Bilateral) as a surgical intervention .  The patient's history has been reviewed, patient examined, no change in status, stable for surgery.  I have reviewed the patient's chart and labs.  Questions were answered to the patient's satisfaction.     Carsin Randazzo A

## 2016-03-20 NOTE — Anesthesia Procedure Notes (Signed)
Procedure Name: Intubation Date/Time: 03/20/2016 7:47 AM Performed by: Lissa Morales Pre-anesthesia Checklist: Patient identified, Emergency Drugs available, Suction available and Patient being monitored Patient Re-evaluated:Patient Re-evaluated prior to inductionOxygen Delivery Method: Circle system utilized Preoxygenation: Pre-oxygenation with 100% oxygen Intubation Type: IV induction Ventilation: Mask ventilation without difficulty Laryngoscope Size: Mac and 4 Grade View: Grade I Tube type: Oral Tube size: 7.0 mm Number of attempts: 1 Airway Equipment and Method: Stylet and Oral airway Placement Confirmation: ETT inserted through vocal cords under direct vision,  positive ETCO2 and breath sounds checked- equal and bilateral Secured at: 21 cm Tube secured with: Tape Dental Injury: Teeth and Oropharynx as per pre-operative assessment  Comments: Intubated by EMS student [place].

## 2016-03-21 DIAGNOSIS — N814 Uterovaginal prolapse, unspecified: Secondary | ICD-10-CM | POA: Diagnosis not present

## 2016-03-21 LAB — BASIC METABOLIC PANEL
Anion gap: 3 — ABNORMAL LOW (ref 5–15)
BUN: 8 mg/dL (ref 6–20)
CALCIUM: 8.6 mg/dL — AB (ref 8.9–10.3)
CO2: 25 mmol/L (ref 22–32)
CREATININE: 0.66 mg/dL (ref 0.44–1.00)
Chloride: 100 mmol/L — ABNORMAL LOW (ref 101–111)
GFR calc Af Amer: >60 mL/min (ref >60)
GLUCOSE: 171 mg/dL — AB (ref 65–99)
Potassium: 4.4 mmol/L (ref 3.5–5.1)
SODIUM: 128 mmol/L — AB (ref 135–145)

## 2016-03-21 LAB — HEMOGLOBIN AND HEMATOCRIT, BLOOD
HCT: 30.2 % — ABNORMAL LOW (ref 36.0–46.0)
HEMOGLOBIN: 10.2 g/dL — AB (ref 12.0–15.0)

## 2016-03-21 NOTE — Progress Notes (Signed)
Looks good  Vitals normal Na 128 (spoke with medicine and offered fluid tips and stopped iv fluids); no sx's Needed pain meds last might for vaginal pain Mild rt subcostal pain today and no leg pain Ambulate and send home today Post op instructions detailed

## 2016-03-21 NOTE — Discharge Summary (Signed)
Date of admission: 03/20/2016  Date of discharge: 03/21/2016  Admission diagnosis: cystocele  Discharge diagnosis: cystocele  Secondary diagnoses: uterine prolapse and rectocele  History and Physical: For full details, please see admission history and physical. Briefly, Sophira Glatz is a 65 y.o. year old patient with the above diagnosis.   Hospital Course: prolapse surgery went well. Excellent post op course  Laboratory values:  Recent Labs  03/20/16 1550 03/21/16 0532  HGB 11.8* 10.2*  HCT 34.6* 30.2*    Recent Labs  03/21/16 0532  CREATININE 0.66    Disposition: Home  Discharge instruction: The patient was instructed to be ambulatory but told to refrain from heavy lifting, strenuous activity, or driving. Detailed  Discharge medications:    Medication List    STOP taking these medications   aspirin EC 81 MG tablet   calcium carbonate 1250 (500 Ca) MG tablet Commonly known as:  OS-CAL - dosed in mg of elemental calcium   cholecalciferol 1000 units tablet Commonly known as:  VITAMIN D   conjugated estrogens vaginal cream Commonly known as:  PREMARIN   cyanocobalamin 500 MCG tablet   Fish Oil 1000 MG Caps   GINGER PO   trimethoprim 100 MG tablet Commonly known as:  TRIMPEX   VITAMIN B50 COMPLEX PO   vitamin C 500 MG tablet Commonly known as:  ASCORBIC ACID     TAKE these medications   HYDROcodone-acetaminophen 5-325 MG tablet Commonly known as:  NORCO Take 1-2 tablets by mouth every 6 (six) hours as needed for moderate pain.   Turmeric 500 MG Tabs Take 500 mg by mouth daily.       Followup:  Follow-up Information    Laverna Dossett A, MD.   Specialty:  Urology Why:  office will call you with date and time of appointment Contact information: [place] [address] [phone]

## 2016-03-21 NOTE — Progress Notes (Signed)
MD updated with patient's status. FC and vaginal packing removed at 0800. Patient able to void 100 ml at 1045. Bladder scan at 1115 showed PVR of 187- pt then able to void 125 ml more. Pain well under control with PO pain meds. Patient able to ambulate entire east wing with husband- tolerated well. Will discharge patient home per MD orders.

## 2016-03-21 NOTE — Discharge Instructions (Signed)
I have reviewed discharge instructions in detail with the patient. They will follow-up with me or their physician as scheduled. My nurse will also be calling the patients as per protocol. As discussed with Dr. Kare Dado. ° °You may resume aspirin, advil, aleve, vitamins, and supplements 7 days after surgery. °

## 2016-03-21 NOTE — Op Note (Signed)
Crystal Oneal, Crystal Oneal               ACCOUNT NO.:  [id number]  MEDICAL RECORD NO.:  [id number]  LOCATION:  [id number]                         FACILITY:  [facility]  PHYSICIAN:  Servando Salina, M.D.DATE OF BIRTH:  11-04-1950  DATE OF PROCEDURE:  03/20/2016 DATE OF DISCHARGE:                              OPERATIVE REPORT   PREOPERATIVE DIAGNOSIS:  Cystocele, uterine prolapse, rectocele, vault prolapse.  PROCEDURE:  Total vaginal hysterectomy, bilateral salpingo-oophorectomy, McCall culdoplasty.  POSTOPERATIVE DIAGNOSIS:  Cystocele, uterine prolapse, rectocele, vault prolapse.  ANESTHESIA:  General.  SURGEON:  Servando Salina, M.D.  ASSISTANT:  Dr. Bjorn Loser.  DESCRIPTION OF PROCEDURE:  Under adequate general anesthesia, the patient was positioned prior to being prepped and draped.  A Foley catheter was placed without bag.  A weighted speculum was placed in the vagina.  Retractor was placed anteriorly.  The cervix was grasped anteriorly and posteriorly with a Jacobson clamp.  The cervical-vaginal junction was injected with dilute solution of 1% lidocaine with epinephrine.  A circumferential incision was then made.  The posterior cul-de-sac was then opened.  Bleeding vessel was encountered on the left which was cauterized.  Subsequently, the posterior cul-de-sac was opened transversely.  The vaginal cuff was oversewn with 0 Vicryl running lock stitch.  The weighted speculum was then re-adjusted into the pelvic cavity.  The uterosacral ligaments were bilaterally clamped, cut, and suture ligated with 0 Vicryl.  Attention was then turned anteriorly. The anterior cul-de-sac was sharply dissected.  A thin peritoneum was initially noted, but was frail.  The LigaSure was then used to bilaterally clamp, cauterized, and cut the cardinal ligaments.  Again attempt to open up the anterior cul-de-sac was not successful, therefore the uterus was inverted, and the vesicouterine peritoneum  was then opened transversely.  The uterus was then returned back into the abdomen.  The bladder was displaced superiorly, and the uterine vessels were bilaterally clamped, cauterized, and cut.  The fallopian tubes were then seen bilaterally.  They were very long.  The ovaries were appeared to be normal.  Continued clamp, cauterization, and cut across the IP ligament resulted in both uterus, cervix, tubes, and ovaries being removed. Bleeding along the left lateral wall where that a vessel was again noted, figure-of-eight suture was then placed.  At that point, once hemostasis was achieved, cystoscopy was done by Dr. Matilde Sprang as was his cystocele repair and vault prolapse.  Please see dictated operative report.  When that portion was done, Gaspar Garbe culdoplasty was done using 0 Vicryl suture, and the uterosacrals were tied in the midline.  The vaginal cuff was then closed with interrupted figure-of-eight sutures. He then went to the rectocele repair, and please see his dictated report on that.  The procedure was subsequently completed. Specimens were uterus, cervix, tubes, and ovaries sent to Pathology.  ESTIMATED BLOOD LOSS:  150.  INTRAOPERATIVE FLUIDS:  3 L.  URINE OUTPUT:  Quantity insufficient.  COUNTS:  Sponge and instrument counts x2 were correct.  COMPLICATION:  None.  The patient tolerated the procedure well and was transferred to recovery room in stable condition.     Servando Salina, M.D.     [place]/MEDQ  D:  03/20/2016  T:  03/21/2016  Job:  [id number]

## 2016-03-21 NOTE — Progress Notes (Signed)
Subjective: Patient reports tolerating PO no flatus. Vaginal pain resolved with removal of packing. Await voiding trial.    Objective: I have reviewed patient's vital signs.  vital signs, intake and output and labs. Vitals:   03/21/16 0218 03/21/16 0441  BP: (!) 108/54 116/62  Pulse: 77 77  Resp: 18 18  Temp: 98.1 F (36.7 C) 98.9 F (37.2 C)   I/O last 3 completed shifts: In: 5185.4 [I.V.:5185.4] Out: 1400 [Urine:1250; Blood:150] Total I/O In: -  Out: 300 [Urine:300]  Lab Results  Component Value Date   WBC 3.8 (L) 03/15/2016   HGB 10.2 (L) 03/21/2016   HCT 30.2 (L) 03/21/2016   MCV 89.3 03/15/2016   PLT 234 03/15/2016   Lab Results  Component Value Date   CREATININE 0.66 03/21/2016    EXAM General: alert, cooperative and no distress Resp: clear to auscultation bilaterally Cardio: regular rate and rhythm, S1, S2 normal, no murmur, click, rub or gallop GI: soft, non-tender; bowel sounds normal; no masses,  no organomegaly Extremities: no edema, redness or tenderness in the calves or thighs Vaginal Bleeding: none  Assessment: s/p Procedure(s): REPAIR CYSTOCELE AND RECTOCELE REPAIR VAULT PROLAPSE AND GRAFT TOTAL VAGINAL HYSTERECTOMY WITH BILATERAL SALPINGO OOPHORECTOMY mccall  Cul de plasty  stable, progressing well and tolerating diet  Plan: Encourage ambulation Discontinue IV fluids Discharge home bladder evaluation postvoid  D/c instructions F/u 6 weeks   LOS: 0 days    Kaiyon Hynes A, MD 03/21/2016 8:54 AM    03/21/2016, 8:54 AM

## 2016-03-22 ENCOUNTER — Encounter (HOSPITAL_COMMUNITY): Payer: Self-pay | Admitting: Urology

## 2016-05-17 DIAGNOSIS — Z09 Encounter for follow-up examination after completed treatment for conditions other than malignant neoplasm: Secondary | ICD-10-CM | POA: Diagnosis not present

## 2016-06-04 DIAGNOSIS — N8111 Cystocele, midline: Secondary | ICD-10-CM | POA: Diagnosis not present

## 2016-06-29 DIAGNOSIS — H524 Presbyopia: Secondary | ICD-10-CM | POA: Diagnosis not present

## 2016-06-29 DIAGNOSIS — H52223 Regular astigmatism, bilateral: Secondary | ICD-10-CM | POA: Diagnosis not present

## 2016-06-29 DIAGNOSIS — H5213 Myopia, bilateral: Secondary | ICD-10-CM | POA: Diagnosis not present

## 2016-07-02 DIAGNOSIS — Z23 Encounter for immunization: Secondary | ICD-10-CM | POA: Diagnosis not present

## 2016-10-08 DIAGNOSIS — Z13220 Encounter for screening for lipoid disorders: Secondary | ICD-10-CM | POA: Diagnosis not present

## 2016-10-08 DIAGNOSIS — Z6822 Body mass index (BMI) 22.0-22.9, adult: Secondary | ICD-10-CM | POA: Diagnosis not present

## 2016-10-08 DIAGNOSIS — Z0000 Encounter for general adult medical examination without abnormal findings: Secondary | ICD-10-CM | POA: Diagnosis not present

## 2016-10-24 DIAGNOSIS — I10 Essential (primary) hypertension: Secondary | ICD-10-CM | POA: Diagnosis not present

## 2016-11-14 DIAGNOSIS — I10 Essential (primary) hypertension: Secondary | ICD-10-CM | POA: Diagnosis not present

## 2016-11-16 DIAGNOSIS — Z800 Family history of malignant neoplasm of digestive organs: Secondary | ICD-10-CM | POA: Diagnosis not present

## 2016-11-16 DIAGNOSIS — Z1211 Encounter for screening for malignant neoplasm of colon: Secondary | ICD-10-CM | POA: Diagnosis not present

## 2016-12-26 DIAGNOSIS — D2262 Melanocytic nevi of left upper limb, including shoulder: Secondary | ICD-10-CM | POA: Diagnosis not present

## 2016-12-26 DIAGNOSIS — L814 Other melanin hyperpigmentation: Secondary | ICD-10-CM | POA: Diagnosis not present

## 2016-12-26 DIAGNOSIS — D2261 Melanocytic nevi of right upper limb, including shoulder: Secondary | ICD-10-CM | POA: Diagnosis not present

## 2017-02-20 DIAGNOSIS — N3000 Acute cystitis without hematuria: Secondary | ICD-10-CM | POA: Diagnosis not present

## 2017-02-20 DIAGNOSIS — R300 Dysuria: Secondary | ICD-10-CM | POA: Diagnosis not present

## 2017-04-25 DIAGNOSIS — R200 Anesthesia of skin: Secondary | ICD-10-CM | POA: Diagnosis not present

## 2017-04-25 DIAGNOSIS — R079 Chest pain, unspecified: Secondary | ICD-10-CM | POA: Diagnosis not present

## 2017-04-25 DIAGNOSIS — I10 Essential (primary) hypertension: Secondary | ICD-10-CM | POA: Diagnosis not present

## 2017-04-25 DIAGNOSIS — R072 Precordial pain: Secondary | ICD-10-CM | POA: Diagnosis not present

## 2017-04-25 DIAGNOSIS — N200 Calculus of kidney: Secondary | ICD-10-CM | POA: Diagnosis not present

## 2017-04-25 DIAGNOSIS — R202 Paresthesia of skin: Secondary | ICD-10-CM | POA: Diagnosis not present

## 2017-04-25 DIAGNOSIS — R110 Nausea: Secondary | ICD-10-CM | POA: Diagnosis not present

## 2017-04-25 DIAGNOSIS — Z79899 Other long term (current) drug therapy: Secondary | ICD-10-CM | POA: Diagnosis not present

## 2017-04-25 DIAGNOSIS — Z23 Encounter for immunization: Secondary | ICD-10-CM | POA: Diagnosis not present

## 2017-04-25 DIAGNOSIS — K219 Gastro-esophageal reflux disease without esophagitis: Secondary | ICD-10-CM | POA: Diagnosis not present

## 2017-04-26 DIAGNOSIS — I10 Essential (primary) hypertension: Secondary | ICD-10-CM | POA: Diagnosis not present

## 2017-04-26 DIAGNOSIS — R200 Anesthesia of skin: Secondary | ICD-10-CM | POA: Diagnosis not present

## 2017-04-26 DIAGNOSIS — R079 Chest pain, unspecified: Secondary | ICD-10-CM

## 2017-05-03 DIAGNOSIS — I10 Essential (primary) hypertension: Secondary | ICD-10-CM | POA: Diagnosis not present

## 2017-05-03 DIAGNOSIS — K219 Gastro-esophageal reflux disease without esophagitis: Secondary | ICD-10-CM | POA: Diagnosis not present

## 2017-05-20 DIAGNOSIS — Z1231 Encounter for screening mammogram for malignant neoplasm of breast: Secondary | ICD-10-CM | POA: Diagnosis not present

## 2017-05-20 DIAGNOSIS — Z01419 Encounter for gynecological examination (general) (routine) without abnormal findings: Secondary | ICD-10-CM | POA: Diagnosis not present

## 2017-07-10 DIAGNOSIS — J00 Acute nasopharyngitis [common cold]: Secondary | ICD-10-CM | POA: Diagnosis not present

## 2017-07-10 DIAGNOSIS — I10 Essential (primary) hypertension: Secondary | ICD-10-CM | POA: Diagnosis not present

## 2017-07-29 DIAGNOSIS — H52223 Regular astigmatism, bilateral: Secondary | ICD-10-CM | POA: Diagnosis not present

## 2017-07-29 DIAGNOSIS — H524 Presbyopia: Secondary | ICD-10-CM | POA: Diagnosis not present

## 2017-07-29 DIAGNOSIS — H5213 Myopia, bilateral: Secondary | ICD-10-CM | POA: Diagnosis not present

## 2017-08-13 HISTORY — PX: BACK SURGERY: SHX140

## 2017-08-23 DIAGNOSIS — H524 Presbyopia: Secondary | ICD-10-CM | POA: Diagnosis not present

## 2017-10-02 DIAGNOSIS — Z90710 Acquired absence of both cervix and uterus: Secondary | ICD-10-CM | POA: Diagnosis not present

## 2017-10-02 DIAGNOSIS — Z9079 Acquired absence of other genital organ(s): Secondary | ICD-10-CM | POA: Diagnosis not present

## 2017-10-02 DIAGNOSIS — M810 Age-related osteoporosis without current pathological fracture: Secondary | ICD-10-CM | POA: Diagnosis not present

## 2017-10-02 DIAGNOSIS — E2839 Other primary ovarian failure: Secondary | ICD-10-CM | POA: Diagnosis not present

## 2017-10-31 DIAGNOSIS — I10 Essential (primary) hypertension: Secondary | ICD-10-CM | POA: Diagnosis not present

## 2017-10-31 DIAGNOSIS — M810 Age-related osteoporosis without current pathological fracture: Secondary | ICD-10-CM | POA: Diagnosis not present

## 2017-10-31 DIAGNOSIS — K219 Gastro-esophageal reflux disease without esophagitis: Secondary | ICD-10-CM | POA: Diagnosis not present

## 2017-11-21 DIAGNOSIS — M545 Low back pain: Secondary | ICD-10-CM | POA: Diagnosis not present

## 2017-11-22 DIAGNOSIS — M48062 Spinal stenosis, lumbar region with neurogenic claudication: Secondary | ICD-10-CM | POA: Diagnosis not present

## 2017-11-22 DIAGNOSIS — I10 Essential (primary) hypertension: Secondary | ICD-10-CM | POA: Diagnosis not present

## 2017-11-22 DIAGNOSIS — M4316 Spondylolisthesis, lumbar region: Secondary | ICD-10-CM | POA: Diagnosis not present

## 2017-12-03 ENCOUNTER — Other Ambulatory Visit: Payer: Self-pay | Admitting: Neurosurgery

## 2017-12-24 NOTE — Pre-Procedure Instructions (Signed)
Sache Sane Lyon  12/24/2017      [place] [place] - [address] [place] [address] Phone: [phone] Fax: [phone]    Your procedure is scheduled on Thurs., Jan 02, 2018  Report to [facility] Admitting Entrance "A" at 5:30AM  Call this number if you have problems the morning of surgery:  [phone]   Remember:  Do not eat food or drink liquids after midnight on May 22  Take these medicines the morning of surgery with A SIP OF WATER: Montelukast (SINGULAIR). If needed Cetirizine (ZYRTEC) for allergies, Acetaminophen (TYLENOL) for pain, Fluticasone (FLONASE) for congestion, and SYSTANE eye drops  Follow your doctors instructions regarding your Aspirin.  If no instructions were given by your doctor, then you will need to call the prescribing office office to get instructions.    7 days before surgery (5/16), stop taking all Aspirins, Vitamins, Fish oils, and Herbal medications. Also stop all NSAIDS i.e. Advil, Ibuprofen, Motrin, Aleve, Anaprox, Naproxen, BC and Goody Powders. Including: GINGER ROOT, Melatonin, and TURMERIC    Do not wear jewelry, make-up or nail polish.  Do not wear lotions, powders, or perfumes, or deodorant.  Do not shave 48 hours prior to surgery.    Do not bring valuables to the hospital.  [facility] is not responsible for any belongings or valuables.  Contacts, dentures or bridgework may not be worn into surgery.  Leave your suitcase in the car.  After surgery it may be brought to your room.  For patients admitted to the hospital, discharge time will be determined by your treatment team.  Patients discharged the day of surgery will not be allowed to drive home.   Special instructions:   [facility]- Preparing For Surgery  Before surgery, you can play an important role. Because skin is not sterile, your skin needs to be as free of germs as possible. You can reduce the number of germs on your  skin by washing with CHG (chlorahexidine gluconate) Soap before surgery.  CHG is an antiseptic cleaner which kills germs and bonds with the skin to continue killing germs even after washing.  Oral Hygiene is also important to reduce your risk of infection.  Remember - BRUSH YOUR TEETH THE MORNING OF SURGERY  Please do not use if you have an allergy to CHG or antibacterial soaps. If your skin becomes reddened/irritated stop using the CHG.  Do not shave (including legs and underarms) for at least 48 hours prior to first CHG shower. It is OK to shave your face.  Please follow these instructions carefully.   1. Shower the NIGHT BEFORE SURGERY and the MORNING OF SURGERY with CHG.   2. If you chose to wash your hair, wash your hair first as usual with your normal shampoo.  3. After you shampoo, rinse your hair and body thoroughly to remove the shampoo.  4. Use CHG as you would any other liquid soap. You can apply CHG directly to the skin and wash gently with a scrungie or a clean washcloth.   5. Apply the CHG Soap to your body ONLY FROM THE NECK DOWN.  Do not use on open wounds or open sores. Avoid contact with your eyes, ears, mouth and genitals (private parts). Wash Face and genitals (private parts)  with your normal soap.  6. Wash thoroughly, paying special attention to the area where your surgery will be performed.  7. Thoroughly rinse your body with warm water from the neck down.  8. DO NOT shower/wash with your normal soap after using and rinsing off the CHG Soap.  9. Pat yourself dry with a CLEAN TOWEL.  10. Wear CLEAN PAJAMAS to bed the night before surgery, wear comfortable clothes the morning of surgery  11. Place CLEAN SHEETS on your bed the night of your first shower and DO NOT SLEEP WITH PETS.  Day of Surgery:  Do not apply any deodorants/lotions.  Please wear clean clothes to the hospital/surgery center.   Remember to brush your teeth.    Please read over the following fact  sheets that you were given. Pain Booklet, Coughing and Deep Breathing, MRSA Information and Surgical Site Infection Prevention

## 2017-12-25 ENCOUNTER — Other Ambulatory Visit: Payer: Self-pay

## 2017-12-25 ENCOUNTER — Encounter (HOSPITAL_COMMUNITY): Payer: Self-pay

## 2017-12-25 ENCOUNTER — Encounter (HOSPITAL_COMMUNITY)
Admission: RE | Admit: 2017-12-25 | Discharge: 2017-12-25 | Disposition: A | Discharge status: Home / Self Care | Payer: PPO | Source: Ambulatory Visit | Attending: Neurosurgery | Admitting: Neurosurgery

## 2017-12-25 DIAGNOSIS — M4316 Spondylolisthesis, lumbar region: Secondary | ICD-10-CM | POA: Insufficient documentation

## 2017-12-25 DIAGNOSIS — Z01812 Encounter for preprocedural laboratory examination: Secondary | ICD-10-CM | POA: Insufficient documentation

## 2017-12-25 DIAGNOSIS — I10 Essential (primary) hypertension: Secondary | ICD-10-CM | POA: Diagnosis not present

## 2017-12-25 HISTORY — DX: Essential (primary) hypertension: I10

## 2017-12-25 HISTORY — DX: Gastro-esophageal reflux disease without esophagitis: K21.9

## 2017-12-25 HISTORY — DX: Spondylolisthesis, lumbar region: M43.16

## 2017-12-25 LAB — BASIC METABOLIC PANEL
ANION GAP: 6 (ref 5–15)
BUN: 9 mg/dL (ref 6–20)
CALCIUM: 10.2 mg/dL (ref 8.9–10.3)
CO2: 29 mmol/L (ref 22–32)
Chloride: 104 mmol/L (ref 101–111)
Creatinine, Ser: 0.71 mg/dL (ref 0.44–1.00)
GLUCOSE: 94 mg/dL (ref 65–99)
POTASSIUM: 4.5 mmol/L (ref 3.5–5.1)
SODIUM: 139 mmol/L (ref 135–145)

## 2017-12-25 LAB — TYPE AND SCREEN
ABO/RH(D): O POS
Antibody Screen: NEGATIVE

## 2017-12-25 LAB — ABO/RH: ABO/RH(D): O POS

## 2017-12-25 LAB — SURGICAL PCR SCREEN
MRSA, PCR: NEGATIVE
Staphylococcus aureus: NEGATIVE

## 2017-12-25 LAB — CBC
HCT: 41.7 % (ref 36.0–46.0)
Hemoglobin: 13.6 g/dL (ref 12.0–15.0)
MCH: 29.2 pg (ref 26.0–34.0)
MCHC: 32.6 g/dL (ref 30.0–36.0)
MCV: 89.7 fL (ref 78.0–100.0)
PLATELETS: 237 10*3/uL (ref 150–400)
RBC: 4.65 MIL/uL (ref 3.87–5.11)
RDW: 12.2 % (ref 11.5–15.5)
WBC: 3.7 10*3/uL — ABNORMAL LOW (ref 4.0–10.5)

## 2017-12-25 NOTE — Progress Notes (Signed)
PCP - Dr. Darrick Meigs Cox/ Darrol Jump PA- [place]   Cardiologist - Denies  Chest x-ray - Denies  EKG - 05/2017- Requested  Stress Test - 05/2017- Requested  ECHO - 05/2017- Requested  Cardiac Cath - Denies  Sleep Study - Denies CPAP - None  LABS- 12/25/17: CBC, BMP, T/S  ASA- LD- 5/16   Anesthesia- Yes- Requested Records  Pt denies having chest pain, sob, or fever at this time. All instructions explained to the pt, with a verbal understanding of the material. Pt agrees to go over the instructions while at home for a better understanding. The opportunity to ask questions was provided.

## 2017-12-27 NOTE — Progress Notes (Signed)
Anesthesia Chart Review:   Case:  [id number] Date/Time:  01/02/18 0715   Procedure:  POSTERIOR LUMBAR INTERBODY FUSION, INTERBODY PROSTHESIS, POSTERIOR INSTRUMENTATION LUMBAR 4- LUMBAR 5 (N/A ) - POSTERIOR LUMBAR INTERBODY FUSION, INTERBODY PROSTHESIS, POSTERIOR INSTRUMENTATION LUMBAR 4- LUMBAR 5   Anesthesia type:  General   Pre-op diagnosis:  SPONDYLOLISTHESIS, LUMBAR REGION   Location:  [facility] OR ROOM 29 / [facility] OR   Surgeon:  Newman Pies, MD      DISCUSSION: Pt is a 67 year old female with hx HTN. Negative stress test 04/2017.    VS: BP (!) 152/80 Comment: taken manually  Pulse 73   Temp 36.5 C   Resp 20   Ht 5\' 1"  (1.549 m)   Wt 116 lb 8 oz (52.8 kg)   SpO2 100%   BMI 22.01 kg/m    LABS: Labs reviewed: Acceptable for surgery. (all labs ordered are listed, but only abnormal results are displayed)  Labs Reviewed  CBC - Abnormal; Notable for the following components:      Result Value   WBC 3.7 (*)    All other components within normal limits  SURGICAL PCR SCREEN  BASIC METABOLIC PANEL  TYPE AND SCREEN  ABO/RH    EKG 04/25/17 [facility]): NSR.  Rightward axis.  Cannot rule out anteroseptal infarct, age undetermined.   CV:  Nuclear stress test 04/26/2017 ([facility]): 1.  No reversible ischemia or infarction. 2.  Normal LV wall motion. 3.  LVEF 92%. 4.  Noninvasive risk stratification: Low   Past Medical History:  Diagnosis Date  . Arthritis   . GERD (gastroesophageal reflux disease)   . Hypertension   . PONV (postoperative nausea and vomiting)   . Spondylolisthesis of lumbar region     Past Surgical History:  Procedure Laterality Date  . ANTERIOR AND POSTERIOR REPAIR N/A 03/20/2016   Procedure: REPAIR CYSTOCELE AND RECTOCELE;  Surgeon: Bjorn Loser, MD;  Location: [facility] ORS;  Service: Urology;  Laterality: N/A;  . COLONOSCOPY    . ESOPHAGEAL DILATION  2005   x 2  . NO PAST SURGERIES    . VAGINAL HYSTERECTOMY Bilateral 03/20/2016   Procedure:  TOTAL VAGINAL HYSTERECTOMY WITH BILATERAL SALPINGO OOPHORECTOMY mccall coloplasty;  Surgeon: Servando Salina, MD;  Location: [facility] ORS;  Service: Gynecology;  Laterality: Bilateral;  . VAGINAL PROLAPSE REPAIR N/A 03/20/2016   Procedure: REPAIR VAULT PROLAPSE AND GRAFT;  Surgeon: Bjorn Loser, MD;  Location: [facility] ORS;  Service: Urology;  Laterality: N/A;    MEDICATIONS: . acetaminophen (TYLENOL) 325 MG tablet  . alendronate (FOSAMAX) 70 MG tablet  . aspirin EC 81 MG tablet  . b complex vitamins tablet  . calcium citrate-vitamin D (CITRACAL+D) 315-200 MG-UNIT tablet  . CAPSAICIN EX  . cetirizine (ZYRTEC) 10 MG tablet  . Cholecalciferol (VITAMIN D) 2000 units tablet  . fluticasone (FLONASE) 50 MCG/ACT nasal spray  . Ginger, Zingiber officinalis, (GINGER ROOT) 550 MG CAPS  . losartan (COZAAR) 25 MG tablet  . Melatonin 1 MG TABS  . montelukast (SINGULAIR) 10 MG tablet  . Omega-3 Fatty Acids (FISH OIL PO)  . Polyethyl Glycol-Propyl Glycol (SYSTANE OP)  . sodium chloride (OCEAN) 0.65 % SOLN nasal spray  . TURMERIC CURCUMIN PO  . vitamin C (ASCORBIC ACID) 500 MG tablet   No current facility-administered medications for this encounter.     If no changes, I anticipate pt can proceed with surgery as scheduled.   Willeen Cass, FNP-BC [facility]/Anesthesiology Phone: [phone] 12/27/2017 9:40 AM

## 2018-01-01 ENCOUNTER — Other Ambulatory Visit: Payer: Self-pay | Admitting: Neurosurgery

## 2018-01-01 ENCOUNTER — Encounter (HOSPITAL_COMMUNITY): Payer: Self-pay | Admitting: Anesthesiology

## 2018-01-01 NOTE — Anesthesia Preprocedure Evaluation (Addendum)
Anesthesia Evaluation  Patient identified by MRN, date of birth, ID band Patient awake    Reviewed: Allergy & Precautions, NPO status , Patient's Chart, lab work & pertinent test results  History of Anesthesia Complications (+) PONV and history of anesthetic complications  Airway Mallampati: I  TM Distance: >3 FB Neck ROM: Full    Dental  (+) Teeth Intact, Dental Advisory Given   Pulmonary neg pulmonary ROS,    breath sounds clear to auscultation       Cardiovascular hypertension, Pt. on medications  Rhythm:Regular Rate:Normal     Neuro/Psych negative neurological ROS  negative psych ROS   GI/Hepatic Neg liver ROS, GERD  ,  Endo/Other  negative endocrine ROS  Renal/GU negative Renal ROS     Musculoskeletal  (+) Arthritis ,   Abdominal Normal abdominal exam  (+)   Peds  Hematology   Anesthesia Other Findings   Reproductive/Obstetrics                            Anesthesia Physical Anesthesia Plan  ASA: II  Anesthesia Plan: General   Post-op Pain Management:    Induction: Intravenous  PONV Risk Score and Plan: 4 or greater and Ondansetron, Dexamethasone, Midazolam and Scopolamine patch - Pre-op  Airway Management Planned: Oral ETT  Additional Equipment: None  Intra-op Plan:   Post-operative Plan: Extubation in OR  Informed Consent: I have reviewed the patients History and Physical, chart, labs and discussed the procedure including the risks, benefits and alternatives for the proposed anesthesia with the patient or authorized representative who has indicated his/her understanding and acceptance.   Dental advisory given  Plan Discussed with: CRNA  Anesthesia Plan Comments:        Anesthesia Quick Evaluation

## 2018-01-02 ENCOUNTER — Inpatient Hospital Stay (HOSPITAL_COMMUNITY)
Admission: RE | Disposition: A | Discharge status: Home / Self Care | Payer: Self-pay | Source: Ambulatory Visit | Attending: Neurosurgery

## 2018-01-02 ENCOUNTER — Inpatient Hospital Stay (HOSPITAL_COMMUNITY)
Admission: RE | Admit: 2018-01-02 | Discharge: 2018-01-03 | DRG: 455 | Disposition: A | Discharge status: Home / Self Care | Payer: PPO | Source: Ambulatory Visit | Attending: Neurosurgery | Admitting: Neurosurgery

## 2018-01-02 ENCOUNTER — Inpatient Hospital Stay (HOSPITAL_COMMUNITY): Payer: PPO | Admitting: Anesthesiology

## 2018-01-02 ENCOUNTER — Encounter (HOSPITAL_COMMUNITY): Payer: Self-pay | Admitting: *Deleted

## 2018-01-02 ENCOUNTER — Inpatient Hospital Stay (HOSPITAL_COMMUNITY): Payer: PPO | Admitting: Emergency Medicine

## 2018-01-02 ENCOUNTER — Inpatient Hospital Stay (HOSPITAL_COMMUNITY): Payer: PPO

## 2018-01-02 ENCOUNTER — Other Ambulatory Visit: Payer: Self-pay

## 2018-01-02 DIAGNOSIS — Z888 Allergy status to other drugs, medicaments and biological substances status: Secondary | ICD-10-CM

## 2018-01-02 DIAGNOSIS — R402413 Glasgow coma scale score 13-15, at hospital admission: Secondary | ICD-10-CM | POA: Diagnosis present

## 2018-01-02 DIAGNOSIS — Z882 Allergy status to sulfonamides status: Secondary | ICD-10-CM | POA: Diagnosis not present

## 2018-01-02 DIAGNOSIS — Z7983 Long term (current) use of bisphosphonates: Secondary | ICD-10-CM | POA: Diagnosis not present

## 2018-01-02 DIAGNOSIS — M4316 Spondylolisthesis, lumbar region: Secondary | ICD-10-CM | POA: Diagnosis present

## 2018-01-02 DIAGNOSIS — I10 Essential (primary) hypertension: Secondary | ICD-10-CM | POA: Diagnosis not present

## 2018-01-02 DIAGNOSIS — Z419 Encounter for procedure for purposes other than remedying health state, unspecified: Secondary | ICD-10-CM

## 2018-01-02 DIAGNOSIS — Z9189 Other specified personal risk factors, not elsewhere classified: Secondary | ICD-10-CM

## 2018-01-02 DIAGNOSIS — Z7982 Long term (current) use of aspirin: Secondary | ICD-10-CM | POA: Diagnosis not present

## 2018-01-02 DIAGNOSIS — Z79899 Other long term (current) drug therapy: Secondary | ICD-10-CM | POA: Diagnosis not present

## 2018-01-02 DIAGNOSIS — Z981 Arthrodesis status: Secondary | ICD-10-CM | POA: Diagnosis not present

## 2018-01-02 DIAGNOSIS — M1990 Unspecified osteoarthritis, unspecified site: Secondary | ICD-10-CM | POA: Diagnosis present

## 2018-01-02 DIAGNOSIS — M4326 Fusion of spine, lumbar region: Secondary | ICD-10-CM | POA: Diagnosis not present

## 2018-01-02 DIAGNOSIS — M5116 Intervertebral disc disorders with radiculopathy, lumbar region: Secondary | ICD-10-CM | POA: Diagnosis not present

## 2018-01-02 DIAGNOSIS — M48061 Spinal stenosis, lumbar region without neurogenic claudication: Secondary | ICD-10-CM | POA: Diagnosis present

## 2018-01-02 DIAGNOSIS — M48062 Spinal stenosis, lumbar region with neurogenic claudication: Secondary | ICD-10-CM | POA: Diagnosis not present

## 2018-01-02 SURGERY — POSTERIOR LUMBAR FUSION 1 LEVEL
Anesthesia: General | Site: Spine Lumbar

## 2018-01-02 MED ORDER — ACETAMINOPHEN 650 MG RE SUPP: 650.0000 mg | RECTAL | Status: DC | PRN

## 2018-01-02 MED ORDER — CEFAZOLIN SODIUM-DEXTROSE 2-4 GM/100ML-% IV SOLN
INTRAVENOUS | Status: AC
  Filled 2018-01-02: qty 100

## 2018-01-02 MED ORDER — CYCLOBENZAPRINE HCL 10 MG PO TABS
10.0000 mg | ORAL_TABLET | Freq: Three times a day (TID) | ORAL | Status: DC | PRN
  Administered 2018-01-02 – 2018-01-03 (×2): 10 mg via ORAL
  Filled 2018-01-02: qty 1

## 2018-01-02 MED ORDER — LORATADINE 10 MG PO TABS
10.0000 mg | ORAL_TABLET | Freq: Every day | ORAL | Status: DC
  Administered 2018-01-03: 10 mg via ORAL
  Filled 2018-01-02: qty 1

## 2018-01-02 MED ORDER — ACETAMINOPHEN 10 MG/ML IV SOLN
INTRAVENOUS | Status: AC
  Filled 2018-01-02: qty 100

## 2018-01-02 MED ORDER — ROCURONIUM BROMIDE 100 MG/10ML IV SOLN
INTRAVENOUS | Status: DC | PRN
  Administered 2018-01-02 (×3): 10 mg via INTRAVENOUS
  Administered 2018-01-02: 50 mg via INTRAVENOUS

## 2018-01-02 MED ORDER — FENTANYL CITRATE (PF) 250 MCG/5ML IJ SOLN
INTRAMUSCULAR | Status: AC
  Filled 2018-01-02: qty 5

## 2018-01-02 MED ORDER — BUPIVACAINE-EPINEPHRINE (PF) 0.5% -1:200000 IJ SOLN
INTRAMUSCULAR | Status: DC | PRN
  Administered 2018-01-02: 10 mL

## 2018-01-02 MED ORDER — MONTELUKAST SODIUM 10 MG PO TABS
10.0000 mg | ORAL_TABLET | Freq: Every day | ORAL | Status: DC
  Administered 2018-01-03: 10 mg via ORAL
  Filled 2018-01-02: qty 1

## 2018-01-02 MED ORDER — PHENYLEPHRINE HCL 10 MG/ML IJ SOLN
INTRAMUSCULAR | Status: DC | PRN
  Administered 2018-01-02 (×7): 80 ug via INTRAVENOUS

## 2018-01-02 MED ORDER — THROMBIN 5000 UNITS EX SOLR
CUTANEOUS | Status: AC
  Filled 2018-01-02: qty 5000

## 2018-01-02 MED ORDER — OXYCODONE HCL 5 MG PO TABS
ORAL_TABLET | ORAL | Status: AC
  Filled 2018-01-02: qty 1

## 2018-01-02 MED ORDER — MENTHOL 3 MG MT LOZG: 1.0000 | LOZENGE | OROMUCOSAL | Status: DC | PRN

## 2018-01-02 MED ORDER — DOCUSATE SODIUM 100 MG PO CAPS
100.0000 mg | ORAL_CAPSULE | Freq: Two times a day (BID) | ORAL | Status: DC
  Administered 2018-01-02 – 2018-01-03 (×3): 100 mg via ORAL
  Filled 2018-01-02 (×3): qty 1

## 2018-01-02 MED ORDER — 0.9 % SODIUM CHLORIDE (POUR BTL) OPTIME
TOPICAL | Status: DC | PRN
  Administered 2018-01-02: 1000 mL

## 2018-01-02 MED ORDER — BUPIVACAINE-EPINEPHRINE (PF) 0.5% -1:200000 IJ SOLN
INTRAMUSCULAR | Status: AC
  Filled 2018-01-02: qty 30

## 2018-01-02 MED ORDER — HYPROMELLOSE (GONIOSCOPIC) 2.5 % OP SOLN: Freq: Every day | OPHTHALMIC | Status: DC | PRN

## 2018-01-02 MED ORDER — PHENOL 1.4 % MT LIQD: 1.0000 | OROMUCOSAL | Status: DC | PRN

## 2018-01-02 MED ORDER — SUGAMMADEX SODIUM 200 MG/2ML IV SOLN
INTRAVENOUS | Status: DC | PRN
  Administered 2018-01-02: 110 mg via INTRAVENOUS

## 2018-01-02 MED ORDER — CEFAZOLIN SODIUM-DEXTROSE 2-4 GM/100ML-% IV SOLN
2.0000 g | Freq: Three times a day (TID) | INTRAVENOUS | Status: AC
  Administered 2018-01-02 (×2): 2 g via INTRAVENOUS
  Filled 2018-01-02 (×2): qty 100

## 2018-01-02 MED ORDER — CYCLOBENZAPRINE HCL 10 MG PO TABS
ORAL_TABLET | ORAL | Status: AC
  Filled 2018-01-02: qty 1

## 2018-01-02 MED ORDER — ARTIFICIAL TEARS OPHTHALMIC OINT
TOPICAL_OINTMENT | OPHTHALMIC | Status: AC
  Filled 2018-01-02: qty 3.5

## 2018-01-02 MED ORDER — MIDAZOLAM HCL 5 MG/5ML IJ SOLN
INTRAMUSCULAR | Status: DC | PRN
  Administered 2018-01-02: 2 mg via INTRAVENOUS

## 2018-01-02 MED ORDER — MORPHINE SULFATE (PF) 4 MG/ML IV SOLN: 4.0000 mg | INTRAVENOUS | Status: DC | PRN

## 2018-01-02 MED ORDER — LACTATED RINGERS IV SOLN
INTRAVENOUS | Status: DC | PRN
  Administered 2018-01-02 (×2): via INTRAVENOUS

## 2018-01-02 MED ORDER — BACITRACIN ZINC 500 UNIT/GM EX OINT
TOPICAL_OINTMENT | CUTANEOUS | Status: AC
  Filled 2018-01-02: qty 28.35

## 2018-01-02 MED ORDER — LIDOCAINE 2% (20 MG/ML) 5 ML SYRINGE
INTRAMUSCULAR | Status: AC
  Filled 2018-01-02: qty 5

## 2018-01-02 MED ORDER — CEFAZOLIN SODIUM-DEXTROSE 2-4 GM/100ML-% IV SOLN
2.0000 g | INTRAVENOUS | Status: AC
  Administered 2018-01-02: 2 g via INTRAVENOUS

## 2018-01-02 MED ORDER — ACETAMINOPHEN 500 MG PO TABS
1000.0000 mg | ORAL_TABLET | Freq: Four times a day (QID) | ORAL | Status: AC
  Administered 2018-01-02 – 2018-01-03 (×4): 1000 mg via ORAL
  Filled 2018-01-02 (×4): qty 2

## 2018-01-02 MED ORDER — BUPIVACAINE LIPOSOME 1.3 % IJ SUSP
20.0000 mL | INTRAMUSCULAR | Status: DC
  Filled 2018-01-02: qty 20

## 2018-01-02 MED ORDER — BUPIVACAINE LIPOSOME 1.3 % IJ SUSP
INTRAMUSCULAR | Status: DC | PRN
  Administered 2018-01-02: 20 mL

## 2018-01-02 MED ORDER — ONDANSETRON HCL 4 MG/2ML IJ SOLN
INTRAMUSCULAR | Status: AC
  Filled 2018-01-02: qty 2

## 2018-01-02 MED ORDER — VANCOMYCIN HCL 1000 MG IV SOLR
INTRAVENOUS | Status: AC
  Filled 2018-01-02: qty 1000

## 2018-01-02 MED ORDER — SODIUM CHLORIDE 0.9 % IV SOLN
INTRAVENOUS | Status: DC | PRN
  Administered 2018-01-02: 500 mL

## 2018-01-02 MED ORDER — PROMETHAZINE HCL 25 MG/ML IJ SOLN: 6.2500 mg | INTRAMUSCULAR | Status: DC | PRN

## 2018-01-02 MED ORDER — FLUTICASONE PROPIONATE 50 MCG/ACT NA SUSP: 1.0000 | Freq: Every day | NASAL | Status: DC | PRN

## 2018-01-02 MED ORDER — BISACODYL 10 MG RE SUPP: 10.0000 mg | Freq: Every day | RECTAL | Status: DC | PRN

## 2018-01-02 MED ORDER — DIPHENHYDRAMINE HCL 50 MG/ML IJ SOLN
INTRAMUSCULAR | Status: DC | PRN
  Administered 2018-01-02: 12.5 mg via INTRAVENOUS

## 2018-01-02 MED ORDER — CHLORHEXIDINE GLUCONATE CLOTH 2 % EX PADS: 6.0000 | MEDICATED_PAD | Freq: Once | CUTANEOUS | Status: DC

## 2018-01-02 MED ORDER — DEXAMETHASONE SODIUM PHOSPHATE 10 MG/ML IJ SOLN
INTRAMUSCULAR | Status: AC
  Filled 2018-01-02: qty 1

## 2018-01-02 MED ORDER — HYDROMORPHONE HCL 2 MG/ML IJ SOLN
INTRAMUSCULAR | Status: AC
  Filled 2018-01-02: qty 1

## 2018-01-02 MED ORDER — SALINE SPRAY 0.65 % NA SOLN: 1.0000 | NASAL | Status: DC | PRN

## 2018-01-02 MED ORDER — VANCOMYCIN HCL 1 G IV SOLR
INTRAVENOUS | Status: DC | PRN
  Administered 2018-01-02: 1000 mg via TOPICAL

## 2018-01-02 MED ORDER — DEXAMETHASONE SODIUM PHOSPHATE 10 MG/ML IJ SOLN
INTRAMUSCULAR | Status: DC | PRN
  Administered 2018-01-02: 10 mg via INTRAVENOUS

## 2018-01-02 MED ORDER — ZOLPIDEM TARTRATE 5 MG PO TABS: 5.0000 mg | ORAL_TABLET | Freq: Every evening | ORAL | Status: DC | PRN

## 2018-01-02 MED ORDER — ONDANSETRON HCL 4 MG PO TABS: 4.0000 mg | ORAL_TABLET | Freq: Four times a day (QID) | ORAL | Status: DC | PRN

## 2018-01-02 MED ORDER — SODIUM CHLORIDE 0.9 % IV SOLN: 250.0000 mL | INTRAVENOUS | Status: DC

## 2018-01-02 MED ORDER — SODIUM CHLORIDE 0.9 % IV SOLN
INTRAVENOUS | Status: DC | PRN
  Administered 2018-01-02: 20 ug/min via INTRAVENOUS

## 2018-01-02 MED ORDER — ONDANSETRON HCL 4 MG/2ML IJ SOLN
INTRAMUSCULAR | Status: DC | PRN
  Administered 2018-01-02: 4 mg via INTRAVENOUS

## 2018-01-02 MED ORDER — HYDROMORPHONE HCL 2 MG/ML IJ SOLN
0.2500 mg | INTRAMUSCULAR | Status: DC | PRN
  Administered 2018-01-02: 0.5 mg via INTRAVENOUS

## 2018-01-02 MED ORDER — LACTATED RINGERS IV SOLN: INTRAVENOUS | Status: DC

## 2018-01-02 MED ORDER — LIDOCAINE 2% (20 MG/ML) 5 ML SYRINGE
INTRAMUSCULAR | Status: DC | PRN
  Administered 2018-01-02: 50 mg via INTRAVENOUS

## 2018-01-02 MED ORDER — ROCURONIUM BROMIDE 50 MG/5ML IV SOLN
INTRAVENOUS | Status: AC
  Filled 2018-01-02: qty 2

## 2018-01-02 MED ORDER — ACETAMINOPHEN 325 MG PO TABS: 650.0000 mg | ORAL_TABLET | ORAL | Status: DC | PRN

## 2018-01-02 MED ORDER — SODIUM CHLORIDE 0.9% FLUSH: 3.0000 mL | INTRAVENOUS | Status: DC | PRN

## 2018-01-02 MED ORDER — SCOPOLAMINE 1 MG/3DAYS TD PT72
MEDICATED_PATCH | TRANSDERMAL | Status: DC | PRN
  Administered 2018-01-02: 1 via TRANSDERMAL

## 2018-01-02 MED ORDER — VITAMIN D 1000 UNITS PO TABS
2000.0000 [IU] | ORAL_TABLET | Freq: Every day | ORAL | Status: DC
  Administered 2018-01-03: 2000 [IU] via ORAL
  Filled 2018-01-02: qty 2

## 2018-01-02 MED ORDER — SODIUM CHLORIDE 0.9% FLUSH
3.0000 mL | Freq: Two times a day (BID) | INTRAVENOUS | Status: DC
  Administered 2018-01-02: 3 mL via INTRAVENOUS

## 2018-01-02 MED ORDER — THROMBIN (RECOMBINANT) 5000 UNITS EX SOLR
OROMUCOSAL | Status: DC | PRN
  Administered 2018-01-02: 5 mL via TOPICAL

## 2018-01-02 MED ORDER — MEPERIDINE HCL 50 MG/ML IJ SOLN: 6.2500 mg | INTRAMUSCULAR | Status: DC | PRN

## 2018-01-02 MED ORDER — FENTANYL CITRATE (PF) 100 MCG/2ML IJ SOLN
INTRAMUSCULAR | Status: DC | PRN
  Administered 2018-01-02 (×2): 50 ug via INTRAVENOUS
  Administered 2018-01-02: 100 ug via INTRAVENOUS

## 2018-01-02 MED ORDER — PROPOFOL 10 MG/ML IV BOLUS
INTRAVENOUS | Status: AC
  Filled 2018-01-02: qty 20

## 2018-01-02 MED ORDER — PROPOFOL 10 MG/ML IV BOLUS
INTRAVENOUS | Status: DC | PRN
  Administered 2018-01-02: 130 mg via INTRAVENOUS

## 2018-01-02 MED ORDER — ARTIFICIAL TEARS OPHTHALMIC OINT
TOPICAL_OINTMENT | OPHTHALMIC | Status: DC | PRN
  Administered 2018-01-02: 1 via OPHTHALMIC

## 2018-01-02 MED ORDER — LOSARTAN POTASSIUM 50 MG PO TABS
25.0000 mg | ORAL_TABLET | Freq: Every day | ORAL | Status: DC
  Administered 2018-01-03: 25 mg via ORAL
  Filled 2018-01-02: qty 1

## 2018-01-02 MED ORDER — OXYCODONE HCL 5 MG PO TABS
10.0000 mg | ORAL_TABLET | ORAL | Status: DC | PRN
  Administered 2018-01-02 – 2018-01-03 (×2): 10 mg via ORAL
  Filled 2018-01-02 (×2): qty 2

## 2018-01-02 MED ORDER — ACETAMINOPHEN 10 MG/ML IV SOLN
INTRAVENOUS | Status: DC | PRN
  Administered 2018-01-02: 1000 mg via INTRAVENOUS

## 2018-01-02 MED ORDER — PHENYLEPHRINE 40 MCG/ML (10ML) SYRINGE FOR IV PUSH (FOR BLOOD PRESSURE SUPPORT)
PREFILLED_SYRINGE | INTRAVENOUS | Status: AC
  Filled 2018-01-02: qty 10

## 2018-01-02 MED ORDER — ONDANSETRON HCL 4 MG/2ML IJ SOLN: 4.0000 mg | Freq: Four times a day (QID) | INTRAMUSCULAR | Status: DC | PRN

## 2018-01-02 MED ORDER — OXYCODONE HCL 5 MG PO TABS
5.0000 mg | ORAL_TABLET | ORAL | Status: DC | PRN
  Administered 2018-01-02 – 2018-01-03 (×2): 5 mg via ORAL
  Filled 2018-01-02: qty 1

## 2018-01-02 MED ORDER — MIDAZOLAM HCL 2 MG/2ML IJ SOLN
INTRAMUSCULAR | Status: AC
  Filled 2018-01-02: qty 2

## 2018-01-02 MED ORDER — BACITRACIN ZINC 500 UNIT/GM EX OINT
TOPICAL_OINTMENT | CUTANEOUS | Status: DC | PRN
  Administered 2018-01-02: 1 via TOPICAL

## 2018-01-02 SURGICAL SUPPLY — 70 items
BAG DECANTER FOR FLEXI CONT (MISCELLANEOUS) ×2 IMPLANT
BENZOIN TINCTURE PRP APPL 2/3 (GAUZE/BANDAGES/DRESSINGS) ×2 IMPLANT
BLADE CLIPPER SURG (BLADE) IMPLANT
BUR MATCHSTICK NEURO 3.0 LAGG (BURR) ×2 IMPLANT
BUR PRECISION FLUTE 6.0 (BURR) ×2 IMPLANT
CAGE ALTERA 10X31X9-13 15D (Cage) ×2 IMPLANT
CANISTER SUCT 3000ML PPV (MISCELLANEOUS) ×2 IMPLANT
CAP REVERE LOCKING (Cap) ×8 IMPLANT
CARTRIDGE OIL MAESTRO DRILL (MISCELLANEOUS) ×1 IMPLANT
CONT SPEC 4OZ CLIKSEAL STRL BL (MISCELLANEOUS) ×2 IMPLANT
COVER BACK TABLE 60X90IN (DRAPES) ×2 IMPLANT
DECANTER SPIKE VIAL GLASS SM (MISCELLANEOUS) IMPLANT
DIFFUSER DRILL AIR PNEUMATIC (MISCELLANEOUS) ×2 IMPLANT
DRAPE C-ARM 42X72 X-RAY (DRAPES) ×4 IMPLANT
DRAPE HALF SHEET 40X57 (DRAPES) ×2 IMPLANT
DRAPE LAPAROTOMY 100X72X124 (DRAPES) ×2 IMPLANT
DRAPE SURG 17X23 STRL (DRAPES) ×8 IMPLANT
DRSG OPSITE POSTOP 4X6 (GAUZE/BANDAGES/DRESSINGS) ×2 IMPLANT
ELECT BLADE 4.0 EZ CLEAN MEGAD (MISCELLANEOUS) ×2
ELECT REM PT RETURN 9FT ADLT (ELECTROSURGICAL) ×2
ELECTRODE BLDE 4.0 EZ CLN MEGD (MISCELLANEOUS) ×1 IMPLANT
ELECTRODE REM PT RTRN 9FT ADLT (ELECTROSURGICAL) ×1 IMPLANT
EVACUATOR 1/8 PVC DRAIN (DRAIN) IMPLANT
GAUZE SPONGE 4X4 12PLY STRL (GAUZE/BANDAGES/DRESSINGS) IMPLANT
GAUZE SPONGE 4X4 16PLY XRAY LF (GAUZE/BANDAGES/DRESSINGS) IMPLANT
GLOVE BIO SURGEON STRL SZ8 (GLOVE) ×4 IMPLANT
GLOVE BIO SURGEON STRL SZ8.5 (GLOVE) ×4 IMPLANT
GLOVE BIOGEL PI IND STRL 6.5 (GLOVE) ×3 IMPLANT
GLOVE BIOGEL PI IND STRL 8 (GLOVE) ×1 IMPLANT
GLOVE BIOGEL PI INDICATOR 6.5 (GLOVE) ×3
GLOVE BIOGEL PI INDICATOR 8 (GLOVE) ×1
GLOVE ECLIPSE 7.0 STRL STRAW (GLOVE) ×4 IMPLANT
GLOVE ECLIPSE 7.5 STRL STRAW (GLOVE) ×2 IMPLANT
GLOVE EXAM NITRILE LRG STRL (GLOVE) IMPLANT
GLOVE EXAM NITRILE XL STR (GLOVE) IMPLANT
GLOVE EXAM NITRILE XS STR PU (GLOVE) IMPLANT
GLOVE SURG SS PI 6.0 STRL IVOR (GLOVE) ×2 IMPLANT
GLOVE SURG SS PI 6.5 STRL IVOR (GLOVE) ×10 IMPLANT
GOWN STRL REUS W/ TWL LRG LVL3 (GOWN DISPOSABLE) ×3 IMPLANT
GOWN STRL REUS W/ TWL XL LVL3 (GOWN DISPOSABLE) ×3 IMPLANT
GOWN STRL REUS W/TWL 2XL LVL3 (GOWN DISPOSABLE) IMPLANT
GOWN STRL REUS W/TWL LRG LVL3 (GOWN DISPOSABLE) ×3
GOWN STRL REUS W/TWL XL LVL3 (GOWN DISPOSABLE) ×3
HEMOSTAT POWDER KIT SURGIFOAM (HEMOSTASIS) ×2 IMPLANT
KIT BASIN OR (CUSTOM PROCEDURE TRAY) ×2 IMPLANT
KIT TURNOVER KIT B (KITS) ×2 IMPLANT
MILL MEDIUM DISP (BLADE) IMPLANT
NEEDLE HYPO 21X1.5 SAFETY (NEEDLE) ×2 IMPLANT
NEEDLE HYPO 22GX1.5 SAFETY (NEEDLE) ×2 IMPLANT
NS IRRIG 1000ML POUR BTL (IV SOLUTION) ×2 IMPLANT
OIL CARTRIDGE MAESTRO DRILL (MISCELLANEOUS) ×2
PACK LAMINECTOMY NEURO (CUSTOM PROCEDURE TRAY) ×2 IMPLANT
PAD ARMBOARD 7.5X6 YLW CONV (MISCELLANEOUS) ×14 IMPLANT
PATTIES SURGICAL .5 X1 (DISPOSABLE) IMPLANT
ROD REVERE 6.35 40MM (Rod) ×4 IMPLANT
SCREW REVERE 6.35 6.5MMX45 (Screw) ×4 IMPLANT
SCREW REVERE 6.35 6.5X40MM (Screw) ×4 IMPLANT
SPONGE LAP 4X18 X RAY DECT (DISPOSABLE) IMPLANT
SPONGE NEURO XRAY DETECT 1X3 (DISPOSABLE) IMPLANT
SPONGE SURGIFOAM ABS GEL 100 (HEMOSTASIS) IMPLANT
STRIP BIOACTIVE 20CC 25X100X8 (Miscellaneous) ×2 IMPLANT
STRIP CLOSURE SKIN 1/2X4 (GAUZE/BANDAGES/DRESSINGS) ×2 IMPLANT
SUT VIC AB 1 CT1 18XBRD ANBCTR (SUTURE) ×2 IMPLANT
SUT VIC AB 1 CT1 8-18 (SUTURE) ×2
SUT VIC AB 2-0 CP2 18 (SUTURE) ×4 IMPLANT
SYR 20CC LL (SYRINGE) ×2 IMPLANT
TOWEL GREEN STERILE (TOWEL DISPOSABLE) ×2 IMPLANT
TOWEL GREEN STERILE FF (TOWEL DISPOSABLE) ×2 IMPLANT
TRAY FOLEY MTR SLVR 16FR STAT (SET/KITS/TRAYS/PACK) ×2 IMPLANT
WATER STERILE IRR 1000ML POUR (IV SOLUTION) ×2 IMPLANT

## 2018-01-02 NOTE — Anesthesia Procedure Notes (Signed)
Procedure Name: Intubation Date/Time: 01/02/2018 7:35 AM Performed by: Clearnce Sorrel, CRNA Pre-anesthesia Checklist: Patient identified, Emergency Drugs available, Suction available and Patient being monitored Patient Re-evaluated:Patient Re-evaluated prior to induction Oxygen Delivery Method: Circle System Utilized Preoxygenation: Pre-oxygenation with 100% oxygen Induction Type: IV induction Ventilation: Mask ventilation without difficulty Laryngoscope Size: Miller and 2 Grade View: Grade I Tube type: Oral Tube size: 7.0 mm Number of attempts: 1 Airway Equipment and Method: Stylet Placement Confirmation: ETT inserted through vocal cords under direct vision,  positive ETCO2 and breath sounds checked- equal and bilateral Secured at: 21 (teeth) cm Tube secured with: Tape Dental Injury: Teeth and Oropharynx as per pre-operative assessment  Comments: Performed by Azzie Roup SRNA

## 2018-01-02 NOTE — Op Note (Signed)
Brief history: The patient is a 67 year old white female who has complained of back, buttock and leg pain consistent with neurogenic claudic claudication.  She has failed medical management.  She was worked up with lumbar x-rays and a lumbar MRI.  This demonstrated an L4-5 spinal listhesis with severe spinal stenosis.  I discussed the various treatment options with the patient.  She has weighed the risks, benefits and alternatives surgery and decided proceed with an L4-5 decompression, instrumentation, and fusion.  Preoperative diagnosis: L4-5 spondylolisthesis, degenerative disc disease, spinal stenosis compressing both the L4 and the L5 nerve roots; lumbago; lumbar radiculopathy; neurogenic claudication  Postoperative diagnosis: The same  Procedure: Bilateral L4-5 laminotomy/foraminotomies to decompress the bilateral L4 and L5 nerve roots(the work required to do this was in addition to the work required to do the posterior lumbar interbody fusion because of the patient's spinal stenosis, facet arthropathy. Etc. requiring a wide decompression of the nerve roots.);  L4-5 transforaminal lumbar interbody fusion with local morselized autograft bone and Kinnex graft extender; insertion of interbody prosthesis at L4-5 (globus peek expandable interbody prosthesis); posterior nonsegmental instrumentation from L4 to L5 with globus titanium pedicle screws and rods; posterior lateral arthrodesis at L4-5 with local morselized autograft bone and Kinnex bone graft extender.  Surgeon: Dr. Earle Gell  Asst.: Dr. Jovita Gamma  Anesthesia: Gen. endotracheal  Estimated blood loss: 125 cc  Drains: None  Complications: None  Description of procedure: The patient was brought to the operating room by the anesthesia team. General endotracheal anesthesia was induced. The patient was turned to the prone position on the Wilson frame. The patient's lumbosacral region was then prepared with Betadine scrub and Betadine  solution. Sterile drapes were applied.  I then injected the area to be incised with Marcaine with epinephrine solution. I then used the scalpel to make a linear midline incision over the L4-5 interspace. I then used electrocautery to perform a bilateral subperiosteal dissection exposing the spinous process and lamina of L4-5. We then obtained intraoperative radiograph to confirm our location. We then inserted the Verstrac retractor to provide exposure.  I began the decompression by using the high speed drill to perform laminotomies at L4-5 bilaterally. We then used the Kerrison punches to widen the laminotomy and removed the ligamentum flavum at L4-5 bilaterally. We used the Kerrison punches to remove the medial facets at L4-5 bilaterally. We performed wide foraminotomies about the bilateral L4 and L5 nerve roots completing the decompression.  We now turned our attention to the posterior lumbar interbody fusion. I used a scalpel to incise the intervertebral disc at L4-5 bilaterally. I then performed a partial intervertebral discectomy at L4-5 bilaterally using the pituitary forceps. We prepared the vertebral endplates at T0-1 bilaterally for the fusion by removing the soft tissues with the curettes. We then used the trial spacers to pick the appropriate sized interbody prosthesis. We prefilled his prosthesis with a combination of local morselized autograft bone that we obtained during the decompression as well as Kinnex bone graft extender. We inserted the prefilled prosthesis into the interspace at L4-5 from the left, we then expanded the prosthesis. There was a good snug fit of the prosthesis in the interspace. We then filled and the remainder of the intervertebral disc space with local morselized autograft bone and Kinnex. This completed the posterior lumbar interbody arthrodesis.  We now turned attention to the instrumentation. Under fluoroscopic guidance we cannulated the bilateral L4 and L5 pedicles  with the bone probe. We then removed the bone  probe. We then tapped the pedicle with a 5.5 millimeter tap. We then removed the tap. We probed inside the tapped pedicle with a ball probe to rule out cortical breaches. We then inserted a 6.5 x 45 and 40 millimeter pedicle screw into the L4 and L5 pedicles bilaterally under fluoroscopic guidance. We then palpated along the medial aspect of the pedicles to rule out cortical breaches. There were none. The nerve roots were not injured. We then connected the unilateral pedicle screws with a lordotic rod. We compressed the construct and secured the rod in place with the caps. We then tightened the caps appropriately. This completed the instrumentation from L4-5 bilaterally.  We now turned our attention to the posterior lateral arthrodesis at L4-5 bilaterally. We used the high-speed drill to decorticate the remainder of the facets, pars, transverse process at L4-5 bilaterally. We then applied a combination of local morselized autograft bone and Kinnex bone graft extender over these decorticated posterior lateral structures. This completed the posterior lateral arthrodesis.  We then obtained hemostasis using bipolar electrocautery. We irrigated the wound out with bacitracin solution. We inspected the thecal sac and nerve roots and noted they were well decompressed. We then removed the retractor. We placed vancomycin powder in the wound. We reapproximated patient's thoracolumbar fascia with interrupted #1 Vicryl suture. We reapproximated patient's subcutaneous tissue with interrupted 2-0 Vicryl suture. The reapproximated patient's skin with Steri-Strips and benzoin. The wound was then coated with bacitracin ointment. A sterile dressing was applied. The drapes were removed. The patient was subsequently returned to the supine position where they were extubated by the anesthesia team. He was then transported to the post anesthesia care unit in stable condition. All sponge  instrument and needle counts were reportedly correct at the end of this case.

## 2018-01-02 NOTE — Transfer of Care (Signed)
Immediate Anesthesia Transfer of Care Note  Patient: Crystal Oneal  Procedure(s) Performed: POSTERIOR LUMBAR INTERBODY FUSION, INTERBODY PROSTHESIS, POSTERIOR INSTRUMENTATION LUMBAR FOUR- LUMBAR FIVE (N/A Spine Lumbar)  Patient Location: PACU  Anesthesia Type:General  Level of Consciousness: awake  Airway & Oxygen Therapy: Patient Spontanous Breathing and Patient connected to nasal cannula oxygen  Post-op Assessment: Report given to RN and Post -op Vital signs reviewed and stable  Post vital signs: Reviewed and stable  Last Vitals:  Vitals Value Taken Time  BP 134/77 01/02/2018 10:42 AM  Temp    Pulse 89 01/02/2018 10:44 AM  Resp 16 01/02/2018 10:44 AM  SpO2 100 % 01/02/2018 10:44 AM  Vitals shown include unvalidated device data.  Last Pain:  Vitals:   01/02/18 0612  TempSrc: Oral  PainSc:          Complications: No apparent anesthesia complications
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
## 2018-01-02 NOTE — Anesthesia Postprocedure Evaluation (Signed)
Anesthesia Post Note  Patient: Crystal Oneal  Procedure(s) Performed: POSTERIOR LUMBAR INTERBODY FUSION, INTERBODY PROSTHESIS, POSTERIOR INSTRUMENTATION LUMBAR FOUR- LUMBAR FIVE (N/A Spine Lumbar)     Patient location during evaluation: PACU Anesthesia Type: General Level of consciousness: awake and alert Pain management: pain level controlled Vital Signs Assessment: post-procedure vital signs reviewed and stable Respiratory status: spontaneous breathing, nonlabored ventilation, respiratory function stable and patient connected to nasal cannula oxygen Cardiovascular status: blood pressure returned to baseline and stable Postop Assessment: no apparent nausea or vomiting Anesthetic complications: no    Last Vitals:  Vitals:   01/02/18 1159 01/02/18 1255  BP:  126/76  Pulse:  66  Resp:  18  Temp: 36.9 C 36.5 C  SpO2:  100%    Last Pain:  Vitals:   01/02/18 1338  TempSrc:   PainSc: 2                  Effie Berkshire

## 2018-01-02 NOTE — Plan of Care (Signed)
  Problem: Activity: Goal: Ability to avoid complications of mobility impairment will improve Outcome: Progressing Goal: Ability to tolerate increased activity will improve Outcome: Progressing Goal: Will remain free from falls Outcome: Progressing   Problem: Bowel/Gastric: Goal: Gastrointestinal status for postoperative course will improve Outcome: Progressing   Problem: Education: Goal: Ability to verbalize activity precautions or restrictions will improve Outcome: Progressing Goal: Knowledge of the prescribed therapeutic regimen will improve Outcome: Progressing Goal: Understanding of discharge needs will improve Outcome: Progressing   Problem: Physical Regulation: Goal: Ability to maintain clinical measurements within normal limits will improve Outcome: Progressing Goal: Postoperative complications will be avoided or minimized Outcome: Progressing Goal: Diagnostic test results will improve Outcome: Progressing   Problem: Pain Management: Goal: Pain level will decrease Outcome: Progressing   Problem: Skin Integrity: Goal: Signs of wound healing will improve Outcome: Progressing   Problem: Health Behavior/Discharge Planning: Goal: Identification of resources available to assist in meeting health care needs will improve Outcome: Progressing   Problem: Bladder/Genitourinary: Goal: Urinary functional status for postoperative course will improve Outcome: Progressing   Problem: Education: Goal: Knowledge of General Education information will improve Outcome: Progressing   Problem: Health Behavior/Discharge Planning: Goal: Ability to manage health-related needs will improve Outcome: Progressing   Problem: Clinical Measurements: Goal: Ability to maintain clinical measurements within normal limits will improve Outcome: Progressing Goal: Will remain free from infection Outcome: Progressing Goal: Diagnostic test results will improve Outcome: Progressing Goal:  Respiratory complications will improve Outcome: Progressing Goal: Cardiovascular complication will be avoided Outcome: Progressing   Problem: Activity: Goal: Risk for activity intolerance will decrease Outcome: Progressing   Problem: Nutrition: Goal: Adequate nutrition will be maintained Outcome: Progressing   Problem: Coping: Goal: Level of anxiety will decrease Outcome: Progressing   Problem: Elimination: Goal: Will not experience complications related to bowel motility Outcome: Progressing Goal: Will not experience complications related to urinary retention Outcome: Progressing   Problem: Pain Managment: Goal: General experience of comfort will improve Outcome: Progressing   Problem: Safety: Goal: Ability to remain free from injury will improve Outcome: Progressing   Problem: Skin Integrity: Goal: Risk for impaired skin integrity will decrease Outcome: Progressing

## 2018-01-02 NOTE — Progress Notes (Signed)
Subjective: The patient is alert and pleasant.  She looks well.  Objective: Vital signs in last 24 hours: Temp:  [97.8 F (36.6 C)-97.9 F (36.6 C)] 97.9 F (36.6 C) (05/23 1045) Pulse Rate:  [79-94] 94 (05/23 1042) Resp:  [16-20] 16 (05/23 1042) BP: (134-156)/(77-78) 134/77 (05/23 1042) SpO2:  [100 %] 100 % (05/23 1042) Weight:  [52.6 kg (116 lb)] 52.6 kg (116 lb) (05/23 0612) Estimated body mass index is 21.92 kg/m as calculated from the following:   Height as of 12/25/17: 5\' 1"  (1.549 m).   Weight as of this encounter: 52.6 kg (116 lb).   Intake/Output from previous day: No intake/output data recorded. Intake/Output this shift: Total I/O In: 1500 [I.V.:1500] Out: 525 [Urine:425; Blood:100]  Physical exam the patient is alert and pleasant.  She is moving her lower extremities well.  Lab Results: No results for input(s): WBC, HGB, HCT, PLT in the last 72 hours. BMET No results for input(s): NA, K, CL, CO2, GLUCOSE, BUN, CREATININE, CALCIUM in the last 72 hours.  Studies/Results: Dg Lumbar Spine 2-3 Views  Result Date: 01/02/2018 CLINICAL DATA:  L4-L5 fusion. EXAM: LUMBAR SPINE - 2-3 VIEW; DG C-ARM 61-120 MIN COMPARISON:  Lumbar spine x-rays dated November 22, 2017. FINDINGS: Frontal and lateral intraoperative x-rays demonstrate pedicle screws and interbody graft at L4-L5. Connecting rods are not yet in place. Improved anterolisthesis at L4-L5. No acute abnormality. IMPRESSION: L4-L5 posterior and interbody fusion. FLUOROSCOPY TIME:  19 seconds. C-arm fluoroscopic images were obtained intraoperatively and submitted for post operative interpretation. Electronically Signed   By: Titus Dubin M.D.   On: 01/02/2018 10:27   Dg Lumbar Spine 1 View  Result Date: 01/02/2018 CLINICAL DATA:  Elective lumbar surgery. EXAM: LUMBAR SPINE - 1 VIEW COMPARISON:  MRI of November 21, 2017. FINDINGS: Single intraoperative cross-table lateral projection of the lumbar spine demonstrates surgical  probe directed toward posterior elements of L5. IMPRESSION: Surgical localization as described above. Electronically Signed   By: Marijo Conception, M.D.   On: 01/02/2018 10:25   Dg C-arm 1-60 Min  Result Date: 01/02/2018 CLINICAL DATA:  L4-L5 fusion. EXAM: LUMBAR SPINE - 2-3 VIEW; DG C-ARM 61-120 MIN COMPARISON:  Lumbar spine x-rays dated November 22, 2017. FINDINGS: Frontal and lateral intraoperative x-rays demonstrate pedicle screws and interbody graft at L4-L5. Connecting rods are not yet in place. Improved anterolisthesis at L4-L5. No acute abnormality. IMPRESSION: L4-L5 posterior and interbody fusion. FLUOROSCOPY TIME:  19 seconds. C-arm fluoroscopic images were obtained intraoperatively and submitted for post operative interpretation. Electronically Signed   By: Titus Dubin M.D.   On: 01/02/2018 10:27    Assessment/Plan: The patient is doing well.  I spoke with her husband and daughter.  LOS: 0 days     Ophelia Charter 01/02/2018, 10:58 AM

## 2018-01-03 LAB — BASIC METABOLIC PANEL
ANION GAP: 7 (ref 5–15)
BUN: 11 mg/dL (ref 6–20)
CHLORIDE: 106 mmol/L (ref 101–111)
CO2: 27 mmol/L (ref 22–32)
CREATININE: 0.79 mg/dL (ref 0.44–1.00)
Calcium: 9.2 mg/dL (ref 8.9–10.3)
GFR calc non Af Amer: >60 mL/min (ref >60)
Glucose, Bld: 119 mg/dL — ABNORMAL HIGH (ref 65–99)
POTASSIUM: 4.4 mmol/L (ref 3.5–5.1)
SODIUM: 140 mmol/L (ref 135–145)

## 2018-01-03 LAB — CBC
HEMATOCRIT: 35.6 % — AB (ref 36.0–46.0)
HEMOGLOBIN: 11.5 g/dL — AB (ref 12.0–15.0)
MCH: 29.1 pg (ref 26.0–34.0)
MCHC: 32.3 g/dL (ref 30.0–36.0)
MCV: 90.1 fL (ref 78.0–100.0)
Platelets: 215 10*3/uL (ref 150–400)
RBC: 3.95 MIL/uL (ref 3.87–5.11)
RDW: 12.2 % (ref 11.5–15.5)
WBC: 14.2 10*3/uL — ABNORMAL HIGH (ref 4.0–10.5)

## 2018-01-03 MED ORDER — DOCUSATE SODIUM 100 MG PO CAPS: 100.0000 mg | ORAL_CAPSULE | Freq: Two times a day (BID) | ORAL | 0 refills | Status: DC

## 2018-01-03 MED ORDER — OXYCODONE HCL 5 MG PO TABS: 5.0000 mg | ORAL_TABLET | ORAL | 0 refills | Status: DC | PRN

## 2018-01-03 MED ORDER — CYCLOBENZAPRINE HCL 10 MG PO TABS: 10.0000 mg | ORAL_TABLET | Freq: Three times a day (TID) | ORAL | 0 refills | Status: DC | PRN

## 2018-01-03 MED FILL — Sodium Chloride IV Soln 0.9%: INTRAVENOUS | Qty: 1000 | Status: AC

## 2018-01-03 MED FILL — Heparin Sodium (Porcine) Inj 1000 Unit/ML: INTRAMUSCULAR | Qty: 30 | Status: AC

## 2018-01-03 MED FILL — Thrombin For Soln 5000 Unit: CUTANEOUS | Qty: 5000 | Status: AC

## 2018-01-03 NOTE — Discharge Summary (Signed)
Physician Discharge Summary  Patient ID: Crystal Oneal MRN: [id number] DOB/AGE: 01/22/1951 67 y.o.  Admit date: 01/02/2018 Discharge date: 01/03/2018  Admission Diagnoses: L4-5 spondylolisthesis, spinal stenosis, neurogenic claudication, lumbago, lumbar radiculopathy  Discharge Diagnoses: The same Active Problems:   Spondylolisthesis of lumbar region   Discharged Condition: good  Hospital Course: I performed an L4-5 decompression, instrumentation, and fusion on patient on 01/02/2018.  The surgery went well.  The patient's postoperative course was unremarkable.  On postoperative day #1 she requested discharge to home.  She was given oral and written discharge instructions.  All the patient's, and her husband's, questions were answered.  Consults: Physical therapy/Occupational Therapy Significant Diagnostic Studies: None Treatments: L4-5 decompression, instrumentation, and fusion. Discharge Exam: Blood pressure 125/77, pulse 60, temperature (!) 97.5 F (36.4 C), temperature source Oral, resp. rate 16, height 5\' 1"  (1.549 m), weight 52.6 kg (116 lb), SpO2 100 %. The patient is alert and pleasant.  She looks well.  Her strength is normal.  Disposition: Home  Discharge Instructions    Call MD for:  difficulty breathing, headache or visual disturbances   Complete by:  As directed    Call MD for:  extreme fatigue   Complete by:  As directed    Call MD for:  hives   Complete by:  As directed    Call MD for:  persistant dizziness or light-headedness   Complete by:  As directed    Call MD for:  persistant nausea and vomiting   Complete by:  As directed    Call MD for:  redness, tenderness, or signs of infection (pain, swelling, redness, odor or green/yellow discharge around incision site)   Complete by:  As directed    Call MD for:  severe uncontrolled pain   Complete by:  As directed    Call MD for:  temperature >100.4   Complete by:  As directed    Diet - low sodium heart  healthy   Complete by:  As directed    Discharge instructions   Complete by:  As directed    Call [phone] for a followup appointment. Take a stool softener while you are using pain medications.   Driving Restrictions   Complete by:  As directed    Do not drive for 2 weeks.   Increase activity slowly   Complete by:  As directed    Lifting restrictions   Complete by:  As directed    Do not lift more than 5 pounds. No excessive bending or twisting.   May shower / Bathe   Complete by:  As directed    Remove the dressing for 3 days after surgery.  You may shower, but leave the incision alone.   Remove dressing in 48 hours   Complete by:  As directed    Your stitches are under the scan and will dissolve by themselves. The Steri-Strips will fall off after you take a few showers. Do not rub back or pick at the wound, Leave the wound alone.     Allergies as of 01/03/2018      Reactions   Lisinopril Cough   Sulfa Antibiotics Rash      Medication List    STOP taking these medications   acetaminophen 325 MG tablet Commonly known as:  TYLENOL     TAKE these medications   alendronate 70 MG tablet Commonly known as:  FOSAMAX Take 70 mg by mouth every Friday. Take with a full glass of water on an  empty stomach.   aspirin EC 81 MG tablet Take 81 mg by mouth daily.   b complex vitamins tablet Take 1 tablet by mouth daily.   calcium citrate-vitamin D 315-200 MG-UNIT tablet Commonly known as:  CITRACAL+D Take 2 tablets by mouth 2 (two) times daily.   CAPSAICIN EX Apply 1 application topically daily as needed (back pain).   cetirizine 10 MG tablet Commonly known as:  ZYRTEC Take 5 mg by mouth daily as needed for allergies.   cyclobenzaprine 10 MG tablet Commonly known as:  FLEXERIL Take 1 tablet (10 mg total) by mouth 3 (three) times daily as needed for muscle spasms.   docusate sodium 100 MG capsule Commonly known as:  COLACE Take 1 capsule (100 mg total) by mouth 2  (two) times daily.   FISH OIL PO Take 1,280 mg by mouth daily.   fluticasone 50 MCG/ACT nasal spray Commonly known as:  FLONASE Place 1 spray into both nostrils daily as needed for allergies or rhinitis.   Ginger Root 550 MG Caps Take 550 mg by mouth daily.   losartan 25 MG tablet Commonly known as:  COZAAR Take 25 mg by mouth daily.   Melatonin 1 MG Tabs Take 1 mg by mouth at bedtime as needed (sleep).   montelukast 10 MG tablet Commonly known as:  SINGULAIR Take 10 mg by mouth daily.   oxyCODONE 5 MG immediate release tablet Commonly known as:  Oxy IR/ROXICODONE Take 1 tablet (5 mg total) by mouth every 4 (four) hours as needed for moderate pain ((score 4 to 6)).   sodium chloride 0.65 % Soln nasal spray Commonly known as:  OCEAN Place 1 spray into both nostrils as needed for congestion.   SYSTANE OP Place 1 drop into both eyes daily as needed (itching eyes).   TURMERIC CURCUMIN PO Take 750 mg by mouth daily.   vitamin C 500 MG tablet Commonly known as:  ASCORBIC ACID Take 500 mg by mouth daily.   Vitamin D 2000 units tablet Take 2,000 Units by mouth daily.        Signed: Ophelia Oneal 01/03/2018, 7:51 AM

## 2018-01-03 NOTE — Progress Notes (Signed)
Patient is discharged from room 3C04 at this time. Alert and in stable condition. IV site d/c'd and instructions read to patient and spouse with understanding verbalized. Left unit via wheelchair with all belongings at side. 

## 2018-01-03 NOTE — Evaluation (Addendum)
Physical Therapy Evaluation and Discharge Patient Details Name: Crystal Oneal MRN: [id number] DOB: 01/22/1951 Today's Date: 01/03/2018   History of Present Illness  Patient is a 67 y.o. F s/p L4-5 decompression, instrumentation and fusion on 01/02/2018.  Clinical Impression  Patient evaluated by Physical Therapy with no further acute PT needs identified. All education has been completed and the patient has no further questions. Demonstrating good pain control and able to ambulate 400 feet with no assistive device without difficulty. Verbalizing and maintaining precautions throughout session. No follow-up Physical Therapy or equipment needs. PT is signing off. Thank you for this referral.     Follow Up Recommendations No PT follow up    Equipment Recommendations  None recommended by PT    Recommendations for Other Services       Precautions / Restrictions Precautions Precautions: Fall;Back Precaution Booklet Issued: Yes (comment) Precaution Comments: Patient able to verbalize and maintain precautions Required Braces or Orthoses: Spinal Brace Spinal Brace: Other (comment)(Aspen brace) Restrictions Weight Bearing Restrictions: No      Mobility  Bed Mobility               General bed mobility comments: Sitting EOB. Patient able to verbalize log roll technique  Transfers Overall transfer level: Independent Equipment used: None                Ambulation/Gait Ambulation/Gait assistance: Modified independent (Device/Increase time) Ambulation Distance (Feet): 400 Feet Assistive device: None Gait Pattern/deviations: Step-through pattern;Narrow base of support     General Gait Details: Patient initially with mild unsteadiness but overall no LOB throughout ambulation without an assistive device.   Stairs Stairs: Yes Stairs assistance: Supervision Stair Management: One rail Left Number of Stairs: 12 General stair comments: step over step pattern   Wheelchair  Mobility    Modified Rankin (Stroke Patients Only)       Balance Overall balance assessment: Mild deficits observed, not formally tested                                           Pertinent Vitals/Pain Pain Assessment: Faces Faces Pain Scale: Hurts a little bit Pain Location: Surgical site (centralized) Pain Descriptors / Indicators: Sore Pain Intervention(s): Monitored during session    Home Living Family/patient expects to be discharged to:: Private residence Living Arrangements: Spouse/significant other Available Help at Discharge: Family Type of Home: House Home Access: Level entry     Home Layout: Two level;Able to live on main level with bedroom/bathroom Home Equipment: None      Prior Function Level of Independence: Independent         Comments: Works part time in [employer]        Extremity/Trunk Assessment   Upper Extremity Assessment Upper Extremity Assessment: Defer to OT evaluation    Lower Extremity Assessment Lower Extremity Assessment: Overall WFL for tasks assessed    Cervical / Trunk Assessment Cervical / Trunk Assessment: Normal  Communication   Communication: No difficulties  Cognition Arousal/Alertness: Awake/alert Behavior During Therapy: WFL for tasks assessed/performed Overall Cognitive Status: Within Functional Limits for tasks assessed                                        General Comments  Able to verbalize technique  for car transfer.     Exercises     Assessment/Plan    PT Assessment Patent does not need any further PT services  PT Problem List         PT Treatment Interventions      PT Goals (Current goals can be found in the Care Plan section)  Acute Rehab PT Goals Patient Stated Goal: return to work PT Goal Formulation: All assessment and education complete, DC therapy    Frequency     Barriers to discharge        Co-evaluation                AM-PAC PT "6 Clicks" Daily Activity  Outcome Measure Difficulty turning over in bed (including adjusting bedclothes, sheets and blankets)?: None Difficulty moving from lying on back to sitting on the side of the bed? : None Difficulty sitting down on and standing up from a chair with arms (e.g., wheelchair, bedside commode, etc,.)?: None Help needed moving to and from a bed to chair (including a wheelchair)?: None Help needed walking in hospital room?: A Little Help needed climbing 3-5 steps with a railing? : A Little 6 Click Score: 22    End of Session Equipment Utilized During Treatment: Gait belt;Back brace Activity Tolerance: Patient tolerated treatment well Patient left: in bed;with call bell/phone within reach Nurse Communication: Mobility status PT Visit Diagnosis: Pain;Unsteadiness on feet (R26.81) Pain - part of body: (back)    Time: 4097-3532 PT Time Calculation (min) (ACUTE ONLY): 14 min   Charges:   PT Evaluation $PT Eval Low Complexity: 1 Low     PT G Codes:       Ellamae Sia, PT, DPT Acute Rehabilitation Services  Pager: [place] 01/03/2018, 9:48 AM

## 2018-01-03 NOTE — Evaluation (Signed)
Occupational Therapy Evaluation and Discharge Patient Details Name: Crystal Oneal MRN: [id number] DOB: 10/08/1950 Today's Date: 01/03/2018    History of Present Illness Patient is a 67 y.o. F s/p L4-5 decompression, instrumentation and fusion on 01/02/2018.   Clinical Impression   This 67 yo female admitted and underwent above presents to acute OT with with all education completed, we will D/C from acute OT.    Follow Up Recommendations  No OT follow up;Supervision - Intermittent    Equipment Recommendations  None recommended by OT       Precautions / Restrictions Precautions Precautions: Fall;Back Precaution Booklet Issued: Yes (comment) Precaution Comments: Patient able to verbalize and maintain precautions Required Braces or Orthoses: Spinal Brace Spinal Brace: Lumbar corset;Applied in sitting position Restrictions Weight Bearing Restrictions: No      Mobility Bed Mobility               General bed mobility comments: Sitting EOB.   Transfers Overall transfer level: Independent Equipment used: None                      ADL either performed or assessed with clinical judgement   ADL                                         General ADL Comments: Educated pt and husband on use of wet wipes for back peri care, using 2 cups for brushing teeth, placing items at countertop level that she needs most often, sequence of dressing, limit sitting to 20-30 minutes at a time--get up and walk around--then can sit for another 20-30 minutes, side stepping into tub.     Vision Patient Visual Report: No change from baseline              Pertinent Vitals/Pain Pain Assessment: 0-10 Pain Score: 7  Faces Pain Scale: Hurts a little bit Pain Location: Surgical site (centralized) Pain Descriptors / Indicators: Sore;Aching Pain Intervention(s): Monitored during session;Repositioned;Patient requesting pain meds-RN notified     Hand Dominance Right   Extremity/Trunk Assessment Upper Extremity Assessment Upper Extremity Assessment: Overall WFL for tasks assessed     Communication Communication Communication: No difficulties   Cognition Arousal/Alertness: Awake/alert Behavior During Therapy: WFL for tasks assessed/performed Overall Cognitive Status: Within Functional Limits for tasks assessed                                                Home Living Family/patient expects to be discharged to:: Private residence Living Arrangements: Spouse/significant other Available Help at Discharge: Family;Available 24 hours/day Type of Home: House Home Access: Level entry     Home Layout: Two level;Able to live on main level with bedroom/bathroom Alternate Level Stairs-Number of Steps: 12 Alternate Level Stairs-Rails: Right;Left Bathroom Shower/Tub: Tub/shower unit;Curtain   Bathroom Toilet: Standard     Home Equipment: None          Prior Functioning/Environment Level of Independence: Independent        Comments: Works part time in [employer] Problem List: Decreased range of motion;Impaired balance (sitting and/or standing);Pain;Decreased knowledge of precautions         OT Goals(Current goals can be found in the care plan section) Acute  Rehab OT Goals Patient Stated Goal: home today  OT Frequency:                AM-PAC PT "6 Clicks" Daily Activity     Outcome Measure Help from another person eating meals?: None Help from another person taking care of personal grooming?: None Help from another person toileting, which includes using toliet, bedpan, or urinal?: None Help from another person bathing (including washing, rinsing, drying)?: None Help from another person to put on and taking off regular upper body clothing?: None Help from another person to put on and taking off regular lower body clothing?: None 6 Click Score: 24   End of Session Equipment Utilized During Treatment:  Back brace Nurse Communication: Patient requests pain meds  Activity Tolerance: Patient tolerated treatment well Patient left: (sitting EOB)  OT Visit Diagnosis: Unsteadiness on feet (R26.81);Pain Pain - part of body: (back)                Time: 0349-1791 OT Time Calculation (min): 15 min Charges:  OT General Charges $OT Visit: 1 Visit OT Evaluation $OT Eval Moderate Complexity: [address] [place] [phone] 01/03/2018

## 2018-01-24 DIAGNOSIS — M4316 Spondylolisthesis, lumbar region: Secondary | ICD-10-CM | POA: Diagnosis not present

## 2018-04-17 DIAGNOSIS — M4316 Spondylolisthesis, lumbar region: Secondary | ICD-10-CM | POA: Diagnosis not present

## 2018-04-17 DIAGNOSIS — I10 Essential (primary) hypertension: Secondary | ICD-10-CM | POA: Diagnosis not present

## 2018-05-07 DIAGNOSIS — M810 Age-related osteoporosis without current pathological fracture: Secondary | ICD-10-CM | POA: Diagnosis not present

## 2018-05-07 DIAGNOSIS — I10 Essential (primary) hypertension: Secondary | ICD-10-CM | POA: Diagnosis not present

## 2018-05-07 DIAGNOSIS — K219 Gastro-esophageal reflux disease without esophagitis: Secondary | ICD-10-CM | POA: Diagnosis not present

## 2018-05-07 DIAGNOSIS — E782 Mixed hyperlipidemia: Secondary | ICD-10-CM | POA: Diagnosis not present

## 2018-05-07 DIAGNOSIS — Z23 Encounter for immunization: Secondary | ICD-10-CM | POA: Diagnosis not present

## 2018-06-09 DIAGNOSIS — D72818 Other decreased white blood cell count: Secondary | ICD-10-CM | POA: Diagnosis not present

## 2018-10-08 DIAGNOSIS — Z01419 Encounter for gynecological examination (general) (routine) without abnormal findings: Secondary | ICD-10-CM | POA: Diagnosis not present

## 2018-10-08 DIAGNOSIS — Z1231 Encounter for screening mammogram for malignant neoplasm of breast: Secondary | ICD-10-CM | POA: Diagnosis not present

## 2018-10-16 DIAGNOSIS — Z0389 Encounter for observation for other suspected diseases and conditions ruled out: Secondary | ICD-10-CM | POA: Diagnosis not present

## 2018-10-16 DIAGNOSIS — R2232 Localized swelling, mass and lump, left upper limb: Secondary | ICD-10-CM | POA: Diagnosis not present

## 2018-11-19 DIAGNOSIS — H409 Unspecified glaucoma: Secondary | ICD-10-CM | POA: Diagnosis not present

## 2018-11-19 DIAGNOSIS — E782 Mixed hyperlipidemia: Secondary | ICD-10-CM | POA: Diagnosis not present

## 2018-11-19 DIAGNOSIS — Z6822 Body mass index (BMI) 22.0-22.9, adult: Secondary | ICD-10-CM | POA: Diagnosis not present

## 2018-11-19 DIAGNOSIS — E7800 Pure hypercholesterolemia, unspecified: Secondary | ICD-10-CM | POA: Diagnosis not present

## 2018-11-19 DIAGNOSIS — M85852 Other specified disorders of bone density and structure, left thigh: Secondary | ICD-10-CM | POA: Diagnosis not present

## 2018-11-19 DIAGNOSIS — Z794 Long term (current) use of insulin: Secondary | ICD-10-CM | POA: Diagnosis not present

## 2018-11-19 DIAGNOSIS — E039 Hypothyroidism, unspecified: Secondary | ICD-10-CM | POA: Diagnosis not present

## 2018-11-19 DIAGNOSIS — F329 Major depressive disorder, single episode, unspecified: Secondary | ICD-10-CM | POA: Diagnosis not present

## 2018-11-19 DIAGNOSIS — R7401 Elevation of levels of liver transaminase levels: Secondary | ICD-10-CM | POA: Diagnosis not present

## 2018-11-19 DIAGNOSIS — E1165 Type 2 diabetes mellitus with hyperglycemia: Secondary | ICD-10-CM | POA: Diagnosis not present

## 2018-11-19 DIAGNOSIS — I10 Essential (primary) hypertension: Secondary | ICD-10-CM | POA: Diagnosis not present

## 2018-11-19 DIAGNOSIS — G8929 Other chronic pain: Secondary | ICD-10-CM | POA: Diagnosis not present

## 2018-11-19 DIAGNOSIS — Z0000 Encounter for general adult medical examination without abnormal findings: Secondary | ICD-10-CM | POA: Diagnosis not present

## 2018-11-19 DIAGNOSIS — M533 Sacrococcygeal disorders, not elsewhere classified: Secondary | ICD-10-CM | POA: Diagnosis not present

## 2018-12-24 DIAGNOSIS — I10 Essential (primary) hypertension: Secondary | ICD-10-CM | POA: Diagnosis not present

## 2019-02-04 DIAGNOSIS — R799 Abnormal finding of blood chemistry, unspecified: Secondary | ICD-10-CM | POA: Diagnosis not present

## 2019-03-16 ENCOUNTER — Other Ambulatory Visit: Payer: Self-pay

## 2019-04-14 DIAGNOSIS — M4316 Spondylolisthesis, lumbar region: Secondary | ICD-10-CM | POA: Insufficient documentation

## 2019-04-14 DIAGNOSIS — I10 Essential (primary) hypertension: Secondary | ICD-10-CM | POA: Insufficient documentation

## 2019-06-05 DIAGNOSIS — Z23 Encounter for immunization: Secondary | ICD-10-CM | POA: Diagnosis not present

## 2019-07-03 ENCOUNTER — Other Ambulatory Visit: Payer: Self-pay

## 2019-07-08 DIAGNOSIS — M810 Age-related osteoporosis without current pathological fracture: Secondary | ICD-10-CM | POA: Diagnosis not present

## 2019-07-08 DIAGNOSIS — K219 Gastro-esophageal reflux disease without esophagitis: Secondary | ICD-10-CM | POA: Diagnosis not present

## 2019-07-08 DIAGNOSIS — Z1231 Encounter for screening mammogram for malignant neoplasm of breast: Secondary | ICD-10-CM | POA: Diagnosis not present

## 2019-07-08 DIAGNOSIS — I10 Essential (primary) hypertension: Secondary | ICD-10-CM | POA: Diagnosis not present

## 2019-07-08 DIAGNOSIS — E782 Mixed hyperlipidemia: Secondary | ICD-10-CM | POA: Diagnosis not present

## 2019-09-16 ENCOUNTER — Other Ambulatory Visit: Payer: Self-pay

## 2019-09-16 ENCOUNTER — Other Ambulatory Visit: Payer: PPO

## 2019-09-16 DIAGNOSIS — R799 Abnormal finding of blood chemistry, unspecified: Secondary | ICD-10-CM | POA: Diagnosis not present

## 2019-09-16 LAB — CBC WITH DIFFERENTIAL/PLATELET
Basophils Absolute: 0 10*3/uL (ref 0.0–0.2)
Basos: 0 %
EOS (ABSOLUTE): 0.4 10*3/uL (ref 0.0–0.4)
Eos: 8 %
Hematocrit: 39 % (ref 34.0–46.6)
Hemoglobin: 13.5 g/dL (ref 11.1–15.9)
Immature Grans (Abs): 0 10*3/uL (ref 0.0–0.1)
Immature Granulocytes: 0 %
Lymphocytes Absolute: 2 10*3/uL (ref 0.7–3.1)
Lymphs: 41 %
MCH: 30.4 pg (ref 26.6–33.0)
MCHC: 34.6 g/dL (ref 31.5–35.7)
MCV: 88 fL (ref 79–97)
Monocytes Absolute: 0.7 10*3/uL (ref 0.1–0.9)
Monocytes: 14 %
Neutrophils Absolute: 1.8 10*3/uL (ref 1.4–7.0)
Neutrophils: 37 %
Platelets: 241 10*3/uL (ref 150–450)
RBC: 4.44 x10E6/uL (ref 3.77–5.28)
RDW: 11.7 % (ref 11.7–15.4)
WBC: 4.9 10*3/uL (ref 3.4–10.8)

## 2019-11-25 ENCOUNTER — Encounter: Payer: Self-pay | Admitting: Family Medicine

## 2019-11-25 DIAGNOSIS — M85851 Other specified disorders of bone density and structure, right thigh: Secondary | ICD-10-CM | POA: Diagnosis not present

## 2019-11-25 DIAGNOSIS — Z1231 Encounter for screening mammogram for malignant neoplasm of breast: Secondary | ICD-10-CM | POA: Diagnosis not present

## 2019-11-25 DIAGNOSIS — M810 Age-related osteoporosis without current pathological fracture: Secondary | ICD-10-CM | POA: Diagnosis not present

## 2019-11-25 DIAGNOSIS — M85852 Other specified disorders of bone density and structure, left thigh: Secondary | ICD-10-CM | POA: Diagnosis not present

## 2019-11-25 DIAGNOSIS — Z780 Asymptomatic menopausal state: Secondary | ICD-10-CM | POA: Diagnosis not present

## 2019-12-16 DIAGNOSIS — Z01419 Encounter for gynecological examination (general) (routine) without abnormal findings: Secondary | ICD-10-CM | POA: Diagnosis not present

## 2019-12-30 ENCOUNTER — Other Ambulatory Visit: Payer: Self-pay

## 2019-12-30 MED ORDER — ALENDRONATE SODIUM 70 MG PO TABS: 70.0000 mg | ORAL_TABLET | ORAL | 2 refills | Status: DC

## 2020-02-03 ENCOUNTER — Encounter: Payer: Self-pay | Admitting: Physician Assistant

## 2020-02-03 ENCOUNTER — Other Ambulatory Visit: Payer: Self-pay

## 2020-02-03 ENCOUNTER — Ambulatory Visit (INDEPENDENT_AMBULATORY_CARE_PROVIDER_SITE_OTHER): Payer: PPO | Admitting: Physician Assistant

## 2020-02-03 VITALS — BP 158/78 | HR 88 | Temp 98.8°F | Ht 61.0 in | Wt 115.0 lb

## 2020-02-03 DIAGNOSIS — M19071 Primary osteoarthritis, right ankle and foot: Secondary | ICD-10-CM | POA: Diagnosis not present

## 2020-02-03 DIAGNOSIS — M79671 Pain in right foot: Secondary | ICD-10-CM | POA: Diagnosis not present

## 2020-02-03 NOTE — Progress Notes (Signed)
Acute Office Visit  Subjective:    Patient ID: Crystal Oneal, female    DOB: 1950/10/23, 69 y.o.   MRN: [id number]  Chief Complaint  Patient presents with  . Foot Pain    HPI Patient is in today for right foot pain Patient states that last night she was cleaning out her tub and somehow possibly stepped wrong and suddenly was not able to bear weight on her right foot - says pain is better today but still not able to bear weight and says hard to straighten out foot when stepping down Came in office with walker  Past Medical History:  Diagnosis Date  . Arthritis   . GERD (gastroesophageal reflux disease)   . Hypertension   . PONV (postoperative nausea and vomiting)   . Spondylolisthesis of lumbar region     Past Surgical History:  Procedure Laterality Date  . ANTERIOR AND POSTERIOR REPAIR N/A 03/20/2016   Procedure: REPAIR CYSTOCELE AND RECTOCELE;  Surgeon: Bjorn Loser, MD;  Location: [facility];  Service: Urology;  Laterality: N/A;  . COLONOSCOPY    . ESOPHAGEAL DILATION  2005   x 2  . NO PAST SURGERIES    . VAGINAL HYSTERECTOMY Bilateral 03/20/2016   Procedure: TOTAL VAGINAL HYSTERECTOMY WITH BILATERAL SALPINGO OOPHORECTOMY mccall coloplasty;  Surgeon: Servando Salina, MD;  Location: [facility];  Service: Gynecology;  Laterality: Bilateral;  . VAGINAL PROLAPSE REPAIR N/A 03/20/2016   Procedure: REPAIR VAULT PROLAPSE AND GRAFT;  Surgeon: Bjorn Loser, MD;  Location: [facility];  Service: Urology;  Laterality: N/A;    History reviewed. No pertinent family history.  Social History   Socioeconomic History  . Marital status: Married    Spouse name: Not on file  . Number of children: Not on file  . Years of education: Not on file  . Highest education level: Not on file  Occupational History  . Not on file  Tobacco Use  . Smoking status: Never Smoker  . Smokeless tobacco: Never Used  Vaping Use  . Vaping Use: Never used  Substance and Sexual Activity  . Alcohol use: No   . Drug use: Not on file  . Sexual activity: Not on file  Other Topics Concern  . Not on file  Social History Narrative  . Not on file   Social Determinants of Health   Financial Resource Strain:   . Difficulty of Paying Living Expenses:   Food Insecurity:   . Worried About Charity fundraiser in the Last Year:   . Arboriculturist in the Last Year:   Transportation Needs:   . Film/video editor (Medical):   Marland Kitchen Lack of Transportation (Non-Medical):   Physical Activity:   . Days of Exercise per Week:   . Minutes of Exercise per Session:   Stress:   . Feeling of Stress :   Social Connections:   . Frequency of Communication with Friends and Family:   . Frequency of Social Gatherings with Friends and Family:   . Attends Religious Services:   . Active Member of Clubs or Organizations:   . Attends Archivist Meetings:   Marland Kitchen Marital Status:   Intimate Partner Violence:   . Fear of Current or Ex-Partner:   . Emotionally Abused:   Marland Kitchen Physically Abused:   . Sexually Abused:      Current Outpatient Medications:  .  alendronate (FOSAMAX) 70 MG tablet, Take 1 tablet (70 mg total) by mouth every Friday. Take with a  full glass of water on an empty stomach., Disp: 12 tablet, Rfl: 2 .  aspirin EC 81 MG tablet, Take 81 mg by mouth daily., Disp: , Rfl:  .  b complex vitamins tablet, Take 1 tablet by mouth daily., Disp: , Rfl:  .  calcium citrate-vitamin D (CITRACAL+D) 315-200 MG-UNIT tablet, Take 2 tablets by mouth 2 (two) times daily., Disp: , Rfl:  .  cetirizine (ZYRTEC) 10 MG tablet, Take 5 mg by mouth daily as needed for allergies., Disp: , Rfl:  .  Cholecalciferol (VITAMIN D) 2000 units tablet, Take 2,000 Units by mouth daily., Disp: , Rfl:  .  fluticasone (FLONASE) 50 MCG/ACT nasal spray, Place 1 spray into both nostrils daily as needed for allergies or rhinitis., Disp: , Rfl:  .  Ginger, Zingiber officinalis, (GINGER ROOT) 550 MG CAPS, Take 550 mg by mouth daily., Disp: ,  Rfl:  .  losartan (COZAAR) 25 MG tablet, Take 25 mg by mouth daily., Disp: , Rfl:  .  Omega-3 Fatty Acids (FISH OIL PO), Take 1,280 mg by mouth daily., Disp: , Rfl:  .  TURMERIC CURCUMIN PO, Take 750 mg by mouth daily., Disp: , Rfl:  .  vitamin C (ASCORBIC ACID) 500 MG tablet, Take 500 mg by mouth daily., Disp: , Rfl:    Allergies  Allergen Reactions  . Lisinopril Cough  . Sulfa Antibiotics Rash    CONSTITUTIONAL: Negative for chills, fatigue, fever, unintentional weight gain and unintentional weight loss.  CARDIOVASCULAR: Negative for chest pain, dizziness, palpitations and pedal edema.  RESPIRATORY: Negative for recent cough and dyspnea.  MSK: see HPI INTEGUMENTARY: Negative for rash.       Objective:    PHYSICAL EXAM:   VS: BP (!) 158/78 (BP Location: Left Arm, Patient Position: Sitting)   Pulse 88   Temp 98.8 F (37.1 C) (Temporal)   Ht 5\' 1"  (1.549 m)   Wt 115 lb (52.2 kg)   SpO2 98%   BMI 21.73 kg/m   GEN: Well nourished, well developed, in no acute distress  Cardiac: RRR; no murmurs, rubs, or gallops,no edema - no significant varicosities Respiratory:  normal respiratory rate and pattern with no distress - normal breath sounds with no rales, rhonchi, wheezes or rubs MS: no deformity or atrophy - mild edema on top of right foot and tender to palpation - right ankle with normal rom and no tenderness Skin: warm and dry, no rash   Wt Readings from Last 3 Encounters:  02/03/20 115 lb (52.2 kg)  01/02/18 116 lb (52.6 kg)  12/25/17 116 lb 8 oz (52.8 kg)    Health Maintenance Due  Topic Date Due  . Hepatitis C Screening  Never done  . TETANUS/TDAP  Never done  . COLONOSCOPY  Never done  . PNA vac Low Risk Adult (1 of 2 - PCV13) Never done    There are no preventive care reminders to display for this patient.        Assessment & Plan:   Problem List Items Addressed This Visit      Other   Right foot pain - Primary    Xray pending Will advise further  treatment after get results      Relevant Orders   DG Foot Complete Right       No orders of the defined types were placed in this encounter.    SARA R Jessup Ogas, PA-C

## 2020-02-03 NOTE — Assessment & Plan Note (Signed)
Xray pending Will advise further treatment after get results

## 2020-03-28 ENCOUNTER — Other Ambulatory Visit: Payer: Self-pay | Admitting: Physician Assistant

## 2020-03-28 MED ORDER — LOSARTAN POTASSIUM 25 MG PO TABS: 25.0000 mg | ORAL_TABLET | Freq: Every day | ORAL | 0 refills | Status: DC

## 2020-03-30 DIAGNOSIS — L814 Other melanin hyperpigmentation: Secondary | ICD-10-CM | POA: Diagnosis not present

## 2020-03-30 DIAGNOSIS — D485 Neoplasm of uncertain behavior of skin: Secondary | ICD-10-CM | POA: Diagnosis not present

## 2020-04-13 DIAGNOSIS — C801 Malignant (primary) neoplasm, unspecified: Secondary | ICD-10-CM | POA: Insufficient documentation

## 2020-04-13 HISTORY — PX: MOHS SURGERY: SHX181

## 2020-04-13 HISTORY — DX: Malignant (primary) neoplasm, unspecified: C80.1

## 2020-04-19 DIAGNOSIS — M4316 Spondylolisthesis, lumbar region: Secondary | ICD-10-CM | POA: Diagnosis not present

## 2020-04-20 DIAGNOSIS — D0439 Carcinoma in situ of skin of other parts of face: Secondary | ICD-10-CM | POA: Diagnosis not present

## 2020-06-15 ENCOUNTER — Encounter: Payer: Self-pay | Admitting: Nurse Practitioner

## 2020-06-15 DIAGNOSIS — E782 Mixed hyperlipidemia: Secondary | ICD-10-CM | POA: Insufficient documentation

## 2020-06-15 DIAGNOSIS — I10 Essential (primary) hypertension: Secondary | ICD-10-CM | POA: Insufficient documentation

## 2020-06-15 NOTE — Progress Notes (Addendum)
Subjective:  Patient ID: Crystal Oneal, female    DOB: August 18, 1950  Age: 69 y.o. MRN: [id number]  Chief Complaint  Patient presents with  . Hypertension    6 months fasting follow up  . Hyperlipidemia    Hypertension This is a chronic problem. The current episode started more than 1 year ago. The problem is unchanged. The problem is controlled. Pertinent negatives include no chest pain, headaches, palpitations or shortness of breath. There are no associated agents to hypertension. Risk factors for coronary artery disease include dyslipidemia. Past treatments include ACE inhibitors and angiotensin blockers. The current treatment provides significant improvement. There are no compliance problems.   Patient had good BP control on Lisinopril. Discontinued due to persistent dry cough. Currently well-controlled on Losartan 25 mg daily. BP home monitoring 120s/80s. States she does have "white-coat" hypertension. BP 142/80 today. Denies chest pain, dyspnea, headaches, or dizziness.  Osteoporosis Patient has history of osteoporosis that is currently treated with Fosamax weekly. Denies falls or pathological fractures. DEXA scan current, next due 2021. Denies back pain.   Osteoarthritis  Patient states she has bilateral wrist pain and tenderness associated with arthritis. States pain is aggravated by cold weather. She occasionally takes Tylenol as needed. Avoids NSAIDs if possible secondary to hypertension diagnosis.   Tinnitus Patient states that she has chronic tinnitus that has become bothersome. Onset was more than a year ago. Denies known hearing loss. No previous treatments tried to decrease tinnitus. Has not seen ENT or audiologist in the past.    Current Outpatient Medications on File Prior to Visit  Medication Sig Dispense Refill  . alendronate (FOSAMAX) 70 MG tablet Take 1 tablet (70 mg total) by mouth every Friday. Take with a full glass of water on an empty stomach. 12 tablet 2  .  aspirin EC 81 MG tablet Take 81 mg by mouth daily.    Marland Kitchen b complex vitamins tablet Take 1 tablet by mouth daily.    . calcium citrate-vitamin D (CITRACAL+D) 315-200 MG-UNIT tablet Take 2 tablets by mouth 2 (two) times daily.    . cetirizine (ZYRTEC) 10 MG tablet Take 5 mg by mouth daily as needed for allergies.    . Cholecalciferol (VITAMIN D) 2000 units tablet Take 2,000 Units by mouth daily.    . fluticasone (FLONASE) 50 MCG/ACT nasal spray Place 1 spray into both nostrils daily as needed for allergies or rhinitis.    . Ginger, Zingiber officinalis, (GINGER ROOT) 550 MG CAPS Take 550 mg by mouth daily.    Marland Kitchen losartan (COZAAR) 25 MG tablet Take 1 tablet (25 mg total) by mouth daily. 90 tablet 0  . Omega-3 Fatty Acids (FISH OIL PO) Take 1,280 mg by mouth daily.    . TURMERIC CURCUMIN PO Take 750 mg by mouth daily.    . vitamin C (ASCORBIC ACID) 500 MG tablet Take 500 mg by mouth daily.     No current facility-administered medications on file prior to visit.   Past Medical History:  Diagnosis Date  . Age-related osteoporosis without current pathological fracture   . Arthritis   . Essential (primary) hypertension   . GERD (gastroesophageal reflux disease)   . Hypertension   . Mixed hyperlipidemia   . PONV (postoperative nausea and vomiting)   . Spondylolisthesis of lumbar region    Past Surgical History:  Procedure Laterality Date  . ANTERIOR AND POSTERIOR REPAIR N/A 03/20/2016   Procedure: REPAIR CYSTOCELE AND RECTOCELE;  Surgeon: Bjorn Loser, MD;  Location: [facility];  Service: Urology;  Laterality: N/A;  . COLONOSCOPY    . ESOPHAGEAL DILATION  2005   x 2  . NO PAST SURGERIES    . VAGINAL HYSTERECTOMY Bilateral 03/20/2016   Procedure: TOTAL VAGINAL HYSTERECTOMY WITH BILATERAL SALPINGO OOPHORECTOMY mccall coloplasty;  Surgeon: Servando Salina, MD;  Location: [facility];  Service: Gynecology;  Laterality: Bilateral;  . VAGINAL PROLAPSE REPAIR N/A 03/20/2016   Procedure: REPAIR VAULT  PROLAPSE AND GRAFT;  Surgeon: Bjorn Loser, MD;  Location: [facility];  Service: Urology;  Laterality: N/A;    Family History  Problem Relation Age of Onset  . Uterine cancer Mother   . Breast cancer Mother   . Lung disease Father   . Breast cancer Sister   . Multiple myeloma Brother    Social History   Socioeconomic History  . Marital status: Married    Spouse name: Not on file  . Number of children: 2  . Years of education: Not on file  . Highest education level: Not on file  Occupational History  . Not on file  Tobacco Use  . Smoking status: Never Smoker  . Smokeless tobacco: Never Used  Vaping Use  . Vaping Use: Never used  Substance and Sexual Activity  . Alcohol use: No  . Drug use: Never  . Sexual activity: Not on file  Other Topics Concern  . Not on file  Social History Narrative  . Not on file   Social Determinants of Health   Financial Resource Strain:   . Difficulty of Paying Living Expenses: Not on file  Food Insecurity:   . Worried About Charity fundraiser in the Last Year: Not on file  . Ran Out of Food in the Last Year: Not on file  Transportation Needs:   . Lack of Transportation (Medical): Not on file  . Lack of Transportation (Non-Medical): Not on file  Physical Activity:   . Days of Exercise per Week: Not on file  . Minutes of Exercise per Session: Not on file  Stress:   . Feeling of Stress : Not on file  Social Connections:   . Frequency of Communication with Friends and Family: Not on file  . Frequency of Social Gatherings with Friends and Family: Not on file  . Attends Religious Services: Not on file  . Active Member of Clubs or Organizations: Not on file  . Attends Archivist Meetings: Not on file  . Marital Status: Not on file    Review of Systems  Constitutional: Negative for fatigue and fever.  HENT: Positive for postnasal drip, rhinorrhea, tinnitus (bilateral, left worse than right) and trouble swallowing (Mild  difficulty swallowing dry foods-cornbread,white meat chicken, history of esophageal dilations x2 with Dr Melina Copa). Negative for congestion, ear pain, sinus pressure and sore throat.   Eyes: Negative for pain.  Respiratory: Negative for cough, chest tightness, shortness of breath and wheezing.   Cardiovascular: Negative for chest pain and palpitations.  Gastrointestinal: Negative for abdominal pain, constipation, diarrhea, nausea and vomiting.  Endocrine: Positive for cold intolerance.  Genitourinary: Negative for dysuria and hematuria.  Musculoskeletal: Positive for arthralgias. Negative for back pain, joint swelling and myalgias.  Skin: Negative for rash.  Allergic/Immunologic: Positive for environmental allergies.  Neurological: Negative for dizziness, weakness and headaches.  Psychiatric/Behavioral: Positive for sleep disturbance (insomnia-controlled with Melatonin 57m PRN). Negative for dysphoric mood. The patient is not nervous/anxious.      Objective:  BP (!) 142/80 (BP Location: Right Arm, Patient Position:  Sitting)   Pulse 75   Temp 97.8 F (36.6 C)   Resp 18   Ht _0  (1.549 m)   Wt 116 lb 6.4 oz (52.8 kg)   SpO2 98%   BMI 21.99 kg/m   BP/Weight 02/03/2020 01/03/2018 3/38/2505  Systolic BP 397 673 -  Diastolic BP 78 77 -  Wt. (Lbs) 115 - 116  BMI 21.73 - 21.92    Physical Exam Vitals reviewed.  Constitutional:      Appearance: Normal appearance.  HENT:     Head: Normocephalic.     Right Ear: Tympanic membrane, ear canal and external ear normal.     Left Ear: Tympanic membrane, ear canal and external ear normal.     Nose: Nose normal.     Mouth/Throat:     Mouth: Mucous membranes are moist.  Cardiovascular:     Rate and Rhythm: Normal rate and regular rhythm.     Pulses: Normal pulses.     Heart sounds: Normal heart sounds.  Pulmonary:     Effort: Pulmonary effort is normal.     Breath sounds: Normal breath sounds.  Abdominal:     Palpations: Abdomen is  soft.  Genitourinary:    Comments: Deferred Musculoskeletal:        General: Normal range of motion.     Cervical back: Normal range of motion.  Skin:    General: Skin is warm and dry.  Neurological:     General: No focal deficit present.     Mental Status: She is alert and oriented to person, place, and time.  Psychiatric:        Mood and Affect: Mood normal.        Behavior: Behavior normal.        Thought Content: Thought content normal.        Judgment: Judgment normal.              Lab Results  Component Value Date   WBC 4.9 09/16/2019   HGB 13.5 09/16/2019   HCT 39.0 09/16/2019   PLT 241 09/16/2019   GLUCOSE 119 (H) 01/03/2018   NA 140 01/03/2018   K 4.4 01/03/2018   CL 106 01/03/2018   CREATININE 0.79 01/03/2018   BUN 11 01/03/2018   CO2 27 01/03/2018   INR 0.95 03/15/2016      Assessment & Plan:     1. Essential (primary) hypertension - Comprehensive metabolic panel - CBC With Diff/Platelet  2. Mixed hyperlipidemia - Comprehensive metabolic panel - Lipid panel  3. Need for vaccination - Flu Vaccine QUAD High Dose(Fluad)  4. Tinnitus of both ears - Ambulatory referral to Audiology  5. Encounter for immunization - Flu Vaccine QUAD High Dose(Fluad)  . Continue heart healthy diet  Continue walking daily Begin Tylenol Arthritis OTC for wrist pain Follow-up with Dr Melina Copa for esophageal dilation as needed Next screening colonoscopy 2023 We will call you with lab results and Audiologist appointment  Follow-up: 6-month  An After Visit Summary was printed and given to the patient  SJerrell Belfast DWellington([phone]

## 2020-06-16 ENCOUNTER — Encounter: Payer: Self-pay | Admitting: Family Medicine

## 2020-06-16 ENCOUNTER — Other Ambulatory Visit: Payer: Self-pay

## 2020-06-16 ENCOUNTER — Ambulatory Visit (INDEPENDENT_AMBULATORY_CARE_PROVIDER_SITE_OTHER): Payer: PPO | Admitting: Nurse Practitioner

## 2020-06-16 ENCOUNTER — Encounter: Payer: Self-pay | Admitting: Nurse Practitioner

## 2020-06-16 ENCOUNTER — Other Ambulatory Visit: Payer: Self-pay | Admitting: Nurse Practitioner

## 2020-06-16 VITALS — BP 142/80 | HR 75 | Temp 97.8°F | Resp 18 | Ht 61.0 in | Wt 116.4 lb

## 2020-06-16 DIAGNOSIS — I10 Essential (primary) hypertension: Secondary | ICD-10-CM

## 2020-06-16 DIAGNOSIS — E782 Mixed hyperlipidemia: Secondary | ICD-10-CM | POA: Diagnosis not present

## 2020-06-16 DIAGNOSIS — Z23 Encounter for immunization: Secondary | ICD-10-CM

## 2020-06-16 DIAGNOSIS — M810 Age-related osteoporosis without current pathological fracture: Secondary | ICD-10-CM

## 2020-06-16 DIAGNOSIS — H9313 Tinnitus, bilateral: Secondary | ICD-10-CM | POA: Diagnosis not present

## 2020-06-16 MED ORDER — ALENDRONATE SODIUM 70 MG PO TABS: 70.0000 mg | ORAL_TABLET | ORAL | 2 refills | Status: DC

## 2020-06-16 NOTE — Patient Instructions (Addendum)
Continue heart healthy diet  Continue walking daily Begin Tylenol Arthritis OTC for wrist pain Follow-up with Dr Melina Copa for esophageal dilation as needed Next screening colonoscopy 2023 We will call you with lab results and Audiologist appointment  Tinnitus Tinnitus refers to hearing a sound when there is no actual source for that sound. This is often described as ringing in the ears. However, people with this condition may hear a variety of noises, in one ear or in both ears. The sounds of tinnitus can be soft, loud, or somewhere in between. Tinnitus can last for a few seconds or can be constant for days. It may go away without treatment and come back at various times. When tinnitus is constant or happens often, it can lead to other problems, such as trouble sleeping and trouble concentrating. Almost everyone experiences tinnitus at some point. Tinnitus that is long-lasting (chronic) or comes back often (recurs) may require medical attention. What are the causes? The cause of tinnitus is often not known. In some cases, it can result from:  Exposure to loud noises from machinery, music, or other sources.  An object (foreign body) stuck in the ear.  Earwax buildup.  Drinking alcohol or caffeine.  Taking certain medicines.  Age-related hearing loss. It may also be caused by medical conditions such as:  Ear or sinus infections.  High blood pressure.  Heart diseases.  Anemia.  Allergies.  Meniere's disease.  Thyroid problems.  Tumors.  A weak, bulging blood vessel (aneurysm) near the ear. What are the signs or symptoms? The main symptom of tinnitus is hearing a sound when there is no source for that sound. It may sound like:  Buzzing.  Roaring.  Ringing.  Blowing air.  Hissing.  Whistling.  Sizzling.  Humming.  Running water.  A musical note.  Tapping. Symptoms may affect only one ear (unilateral) or both ears (bilateral). How is this  diagnosed? Tinnitus is diagnosed based on your symptoms, your medical history, and a physical exam. Your health care provider may do a thorough hearing test (audiologic exam) if your tinnitus:  Is unilateral.  Causes hearing difficulties.  Lasts 6 months or longer. You may work with a health care provider who specializes in hearing disorders (audiologist). You may be asked questions about your symptoms and how they affect your daily life. You may have other tests done, such as:  CT scan.  MRI.  An imaging test of how blood flows through your blood vessels (angiogram). How is this treated? Treating an underlying medical condition can sometimes make tinnitus go away. If your tinnitus continues, other treatments may include:  Medicines.  Therapy and counseling to help you manage the stress of living with tinnitus.  Sound generators to mask the tinnitus. These include: ? Tabletop sound machines that play relaxing sounds to help you fall asleep. ? Wearable devices that fit in your ear and play sounds or music. ? Acoustic neural stimulation. This involves using headphones to listen to music that contains an auditory signal. Over time, listening to this signal may change some pathways in your brain and make you less sensitive to tinnitus. This treatment is used for very severe cases when no other treatment is working.  Using hearing aids or cochlear implants if your tinnitus is related to hearing loss. Hearing aids are worn in the outer ear. Cochlear implants are surgically placed in the inner ear. Follow these instructions at home: Managing symptoms      When possible, avoid being in loud  places and being exposed to loud sounds.  Wear hearing protection, such as earplugs, when you are exposed to loud noises.  Use a white noise machine, a humidifier, or other devices to mask the sound of tinnitus.  Practice techniques for reducing stress, such as meditation, yoga, or deep breathing.  Work with your health care provider if you need help with managing stress.  Sleep with your head slightly raised. This may reduce the impact of tinnitus. General instructions  Do not use stimulants, such as nicotine, alcohol, or caffeine. Talk with your health care provider about other stimulants to avoid. Stimulants are substances that can make you feel alert and attentive by increasing certain activities in the body (such as heart rate and blood pressure). These substances may make tinnitus worse.  Take over-the-counter and prescription medicines only as told by your health care provider.  Try to get plenty of sleep each night.  Keep all follow-up visits as told by your health care provider. This is important. Contact a health care provider if:  Your tinnitus continues for 3 weeks or longer without stopping.  You develop sudden hearing loss.  Your symptoms get worse or do not get better with home care.  You feel you are not able to manage the stress of living with tinnitus. Get help right away if:  You develop tinnitus after a head injury.  You have tinnitus along with any of the following: ? Dizziness. ? Loss of balance. ? Nausea and vomiting. ? Sudden, severe headache. These symptoms may represent a serious problem that is an emergency. Do not wait to see if the symptoms will go away. Get medical help right away. Call your local emergency services (911 in the [place] Do not drive yourself to the hospital. Summary  Tinnitus refers to hearing a sound when there is no actual source for that sound. This is often described as ringing in the ears.  Symptoms may affect only one ear (unilateral) or both ears (bilateral).  Use a white noise machine, a humidifier, or other devices to mask the sound of tinnitus.  Do not use stimulants, such as nicotine, alcohol, or caffeine. Talk with your health care provider about other stimulants to avoid. These substances may make tinnitus  worse. This information is not intended to replace advice given to you by your health care provider. Make sure you discuss any questions you have with your health care provider. Document Revised: 02/11/2019 Document Reviewed: 05/09/2017 Elsevier Patient Education  [place].   Acetaminophen biphasic or extended-release tablets What is this medicine? ACETAMINOPHEN (a set a MEE noe fen) is a pain reliever. It is used to treat mild pain and fever. This medicine may be used for other purposes; ask your health care provider or pharmacist if you have questions. COMMON BRAND NAME(S): Mapap Arthritis Pain, Tylenol 8 Hour, Tylenol Arthritis Pain What should I tell my health care provider before I take this medicine? They need to know if you have any of these conditions:  if you often drink alcohol  liver disease  an unusual or allergic reaction to acetaminophen, other medicines, foods, dyes, or preservatives  pregnant or trying to get pregnant  breast-feeding How should I use this medicine? Take this medicine by mouth with a glass of water. Follow the directions on the package or prescription label. Do not cut, crush or chew this medicine. Take your medicine at regular intervals. Do not take your medicine more often than directed. Talk to your pediatrician regarding  the use of this medicine in children. While this drug may be prescribed for children as young as 49 years of age for selected conditions, precautions do apply. Overdosage: If you think you have taken too much of this medicine contact a poison control center or emergency room at once. NOTE: This medicine is only for you. Do not share this medicine with others. What if I miss a dose? If you miss a dose, take it as soon as you can. If it is almost time for your next dose, take only that dose. Do not take double or extra doses. What may interact with this medicine?  alcohol  imatinib  isoniazid  other medicines with  acetaminophen This list may not describe all possible interactions. Give your health care provider a list of all the medicines, herbs, non-prescription drugs, or dietary supplements you use. Also tell them if you smoke, drink alcohol, or use illegal drugs. Some items may interact with your medicine. What should I watch for while using this medicine? Tell your doctor or health care professional if the pain lasts more than 10 days (5 days for children), if it gets worse, or if there is a new or different kind of pain. Also, check with your doctor if a fever lasts for more than 3 days. Do not take other medicines that contain acetaminophen with this medicine. Always read labels carefully. If you have questions, ask your doctor or pharmacist. If you take too much acetaminophen get medical help right away. Too much acetaminophen can be very dangerous and cause liver damage. Even if you do not have symptoms, it is important to get help right away. What side effects may I notice from receiving this medicine? Side effects that you should report to your doctor or health care professional as soon as possible:  allergic reactions like skin rash, itching or hives, swelling of the face, lips, or tongue  breathing problems  fever or sore throat  redness, blistering, peeling or loosening of the skin, including inside the mouth  trouble passing urine or change in the amount of urine  unusual bleeding or bruising  unusually weak or tired  yellowing of the eyes or skin Side effects that usually do not require medical attention (report to your doctor or health care professional if they continue or are bothersome):  headache  nausea, vomiting This list may not describe all possible side effects. Call your doctor for medical advice about side effects. You may report side effects to FDA at 1-800-FDA-1088. Where should I keep my medicine? Keep out of reach of children. Store at room temperature between 20  and 25 degrees C (68 and 77 degrees F). Protect from moisture and heat. Throw away any unused medicine after the expiration date. NOTE: This sheet is a summary. It may not cover all possible information. If you have questions about this medicine, talk to your doctor, pharmacist, or health care provider.  2020 Elsevier/Gold Standard (2013-03-23 12:56:12) Arthritis Arthritis means joint pain. It can also mean joint disease. A joint is a place where bones come together. There are more than 100 types of arthritis. What are the causes? This condition may be caused by:  Wear and tear of a joint. This is the most common cause.  A lot of acid in the blood, which leads to pain in the joint (gout).  Pain and swelling (inflammation) in a joint.  Infection of a joint.  Injuries in the joint.  A reaction to medicines (allergy). In  some cases, the cause may not be known. What are the signs or symptoms? Symptoms of this condition include:  Redness at a joint.  Swelling at a joint.  Stiffness at a joint.  Warmth coming from the joint.  A fever.  A feeling of being sick. How is this treated? This condition may be treated with:  Treating the cause, if it is known.  Rest.  Raising (elevating) the joint.  Putting cold or hot packs on the joint.  Medicines to treat symptoms and reduce pain and swelling.  Shots of medicines (cortisone) into the joint. You may also be told to make changes in your life, such as doing exercises and losing weight. Follow these instructions at home: Medicines  Take over-the-counter and prescription medicines only as told by your doctor.  Do not take aspirin for pain if your doctor says that you may have gout. Activity  Rest your joint if your doctor tells you to.  Avoid activities that make the pain worse.  Exercise your joint regularly as told by your doctor. Try doing exercises like: ? Swimming. ? Water  aerobics. ? Biking. ? Walking. Managing pain, stiffness, and swelling      If told, put ice on the affected area. ? Put ice in a plastic bag. ? Place a towel between your skin and the bag. ? Leave the ice on for 20 minutes, 2-3 times per day.  If your joint is swollen, raise (elevate) it above the level of your heart if told by your doctor.  If your joint feels stiff in the morning, try taking a warm shower.  If told, put heat on the affected area. Do this as often as told by your doctor. Use the heat source that your doctor recommends, such as a moist heat pack or a heating pad. If you have diabetes, do not apply heat without asking your doctor. To apply heat: ? Place a towel between your skin and the heat source. ? Leave the heat on for 20-30 minutes. ? Remove the heat if your skin turns bright red. This is very important if you are unable to feel pain, heat, or cold. You may have a greater risk of getting burned. General instructions  Do not use any products that contain nicotine or tobacco, such as cigarettes, e-cigarettes, and chewing tobacco. If you need help quitting, ask your doctor.  Keep all follow-up visits as told by your doctor. This is important. Contact a doctor if:  The pain gets worse.  You have a fever. Get help right away if:  You have very bad pain in your joint.  You have swelling in your joint.  Your joint is red.  Many joints become painful and swollen.  You have very bad back pain.  Your leg is very weak.  You cannot control your pee (urine) or poop (stool). Summary  Arthritis means joint pain. It can also mean joint disease. A joint is a place where bones come together.  The most common cause of this condition is wear and tear of a joint.  Symptoms of this condition include redness, swelling, or stiffness of the joint.  This condition is treated with rest, raising the joint, medicines, and putting cold or hot packs on the joint.  Follow  your doctor's instructions about medicines, activity, exercises, and other home care treatments. This information is not intended to replace advice given to you by your health care provider. Make sure you discuss any questions you have with  your health care provider. Document Revised: 07/07/2018 Document Reviewed: 07/07/2018 Elsevier Patient Education  2020 [place] 65 Years and Older, Female Preventive care refers to lifestyle choices and visits with your health care provider that can promote health and wellness. This includes:  A yearly physical exam. This is also called an annual well check.  Regular dental and eye exams.  Immunizations.  Screening for certain conditions.  Healthy lifestyle choices, such as diet and exercise. What can I expect for my preventive care visit? Physical exam Your health care provider will check:  Height and weight. These may be used to calculate body mass index (BMI), which is a measurement that tells if you are at a healthy weight.  Heart rate and blood pressure.  Your skin for abnormal spots. Counseling Your health care provider may ask you questions about:  Alcohol, tobacco, and drug use.  Emotional well-being.  Home and relationship well-being.  Sexual activity.  Eating habits.  History of falls.  Memory and ability to understand (cognition).  Work and work Statistician.  Pregnancy and menstrual history. What immunizations do I need?  Influenza (flu) vaccine  This is recommended every year. Tetanus, diphtheria, and pertussis (Tdap) vaccine  You may need a Td booster every 10 years. Varicella (chickenpox) vaccine  You may need this vaccine if you have not already been vaccinated. Zoster (shingles) vaccine  You may need this after age 78. Pneumococcal conjugate (PCV13) vaccine  One dose is recommended after age 62. Pneumococcal polysaccharide (PPSV23) vaccine  One dose is recommended after age  66. Measles, mumps, and rubella (MMR) vaccine  You may need at least one dose of MMR if you were born in 1957 or later. You may also need a second dose. Meningococcal conjugate (MenACWY) vaccine  You may need this if you have certain conditions. Hepatitis A vaccine  You may need this if you have certain conditions or if you travel or work in places where you may be exposed to hepatitis A. Hepatitis B vaccine  You may need this if you have certain conditions or if you travel or work in places where you may be exposed to hepatitis B. Haemophilus influenzae type b (Hib) vaccine  You may need this if you have certain conditions. You may receive vaccines as individual doses or as more than one vaccine together in one shot (combination vaccines). Talk with your health care provider about the risks and benefits of combination vaccines. What tests do I need? Blood tests  Lipid and cholesterol levels. These may be checked every 5 years, or more frequently depending on your overall health.  Hepatitis C test.  Hepatitis B test. Screening  Lung cancer screening. You may have this screening every year starting at age 59 if you have a 30-pack-year history of smoking and currently smoke or have quit within the past 15 years.  Colorectal cancer screening. All adults should have this screening starting at age 29 and continuing until age 3. Your health care provider may recommend screening at age 25 if you are at increased risk. You will have tests every 1-10 years, depending on your results and the type of screening test.  Diabetes screening. This is done by checking your blood sugar (glucose) after you have not eaten for a while (fasting). You may have this done every 1-3 years.  Mammogram. This may be done every 1-2 years. Talk with your health care provider about how often you should have regular mammograms.  BRCA-related cancer screening.  This may be done if you have a family history of  breast, ovarian, tubal, or peritoneal cancers. Other tests  Sexually transmitted disease (STD) testing.  Bone density scan. This is done to screen for osteoporosis. You may have this done starting at age 34. Follow these instructions at home: Eating and drinking  Eat a diet that includes fresh fruits and vegetables, whole grains, lean protein, and low-fat dairy products. Limit your intake of foods with high amounts of sugar, saturated fats, and salt.  Take vitamin and mineral supplements as recommended by your health care provider.  Do not drink alcohol if your health care provider tells you not to drink.  If you drink alcohol: ? Limit how much you have to 0-1 drink a day. ? Be aware of how much alcohol is in your drink. In the [place] one drink equals one 12 oz bottle of beer (355 mL), one 5 oz glass of wine (148 mL), or one 1 oz glass of hard liquor (44 mL). Lifestyle  Take daily care of your teeth and gums.  Stay active. Exercise for at least 30 minutes on 5 or more days each week.  Do not use any products that contain nicotine or tobacco, such as cigarettes, e-cigarettes, and chewing tobacco. If you need help quitting, ask your health care provider.  If you are sexually active, practice safe sex. Use a condom or other form of protection in order to prevent STIs (sexually transmitted infections).  Talk with your health care provider about taking a low-dose aspirin or statin. What's next?  Go to your health care provider once a year for a well check visit.  Ask your health care provider how often you should have your eyes and teeth checked.  Stay up to date on all vaccines. This information is not intended to replace advice given to you by your health care provider. Make sure you discuss any questions you have with your health care provider. Document Revised: 07/24/2018 Document Reviewed: 07/24/2018 Elsevier Patient Education  2020 [place] Maintenance,  Female Adopting a healthy lifestyle and getting preventive care are important in promoting health and wellness. Ask your health care provider about:  The right schedule for you to have regular tests and exams.  Things you can do on your own to prevent diseases and keep yourself healthy. What should I know about diet, weight, and exercise? Eat a healthy diet   Eat a diet that includes plenty of vegetables, fruits, low-fat dairy products, and lean protein.  Do not eat a lot of foods that are high in solid fats, added sugars, or sodium. Maintain a healthy weight Body mass index (BMI) is used to identify weight problems. It estimates body fat based on height and weight. Your health care provider can help determine your BMI and help you achieve or maintain a healthy weight. Get regular exercise Get regular exercise. This is one of the most important things you can do for your health. Most adults should:  Exercise for at least 150 minutes each week. The exercise should increase your heart rate and make you sweat (moderate-intensity exercise).  Do strengthening exercises at least twice a week. This is in addition to the moderate-intensity exercise.  Spend less time sitting. Even light physical activity can be beneficial. Watch cholesterol and blood lipids Have your blood tested for lipids and cholesterol at 69 years of age, then have this test every 5 years. Have your cholesterol levels checked more often if:  Your lipid  or cholesterol levels are high.  You are older than 69 years of age.  You are at high risk for heart disease. What should I know about cancer screening? Depending on your health history and family history, you may need to have cancer screening at various ages. This may include screening for:  Breast cancer.  Cervical cancer.  Colorectal cancer.  Skin cancer.  Lung cancer. What should I know about heart disease, diabetes, and high blood pressure? Blood pressure  and heart disease  High blood pressure causes heart disease and increases the risk of stroke. This is more likely to develop in people who have high blood pressure readings, are of African descent, or are overweight.  Have your blood pressure checked: ? Every 3-5 years if you are 41-2 years of age. ? Every year if you are 110 years old or older. Diabetes Have regular diabetes screenings. This checks your fasting blood sugar level. Have the screening done:  Once every three years after age 55 if you are at a normal weight and have a low risk for diabetes.  More often and at a younger age if you are overweight or have a high risk for diabetes. What should I know about preventing infection? Hepatitis B If you have a higher risk for hepatitis B, you should be screened for this virus. Talk with your health care provider to find out if you are at risk for hepatitis B infection. Hepatitis C Testing is recommended for:  Everyone born from [birth year] through 1965.  Anyone with known risk factors for hepatitis C. Sexually transmitted infections (STIs)  Get screened for STIs, including gonorrhea and chlamydia, if: ? You are sexually active and are younger than 69 years of age. ? You are older than 69 years of age and your health care provider tells you that you are at risk for this type of infection. ? Your sexual activity has changed since you were last screened, and you are at increased risk for chlamydia or gonorrhea. Ask your health care provider if you are at risk.  Ask your health care provider about whether you are at high risk for HIV. Your health care provider may recommend a prescription medicine to help prevent HIV infection. If you choose to take medicine to prevent HIV, you should first get tested for HIV. You should then be tested every 3 months for as long as you are taking the medicine. Pregnancy  If you are about to stop having your period (premenopausal) and you may become pregnant,  seek counseling before you get pregnant.  Take 400 to 800 micrograms (mcg) of folic acid every day if you become pregnant.  Ask for birth control (contraception) if you want to prevent pregnancy. Osteoporosis and menopause Osteoporosis is a disease in which the bones lose minerals and strength with aging. This can result in bone fractures. If you are 61 years old or older, or if you are at risk for osteoporosis and fractures, ask your health care provider if you should:  Be screened for bone loss.  Take a calcium or vitamin D supplement to lower your risk of fractures.  Be given hormone replacement therapy (HRT) to treat symptoms of menopause. Follow these instructions at home: Lifestyle  Do not use any products that contain nicotine or tobacco, such as cigarettes, e-cigarettes, and chewing tobacco. If you need help quitting, ask your health care provider.  Do not use street drugs.  Do not share needles.  Ask your health care provider  for help if you need support or information about quitting drugs. Alcohol use  Do not drink alcohol if: ? Your health care provider tells you not to drink. ? You are pregnant, may be pregnant, or are planning to become pregnant.  If you drink alcohol: ? Limit how much you use to 0-1 drink a day. ? Limit intake if you are breastfeeding.  Be aware of how much alcohol is in your drink. In the [place] one drink equals one 12 oz bottle of beer (355 mL), one 5 oz glass of wine (148 mL), or one 1 oz glass of hard liquor (44 mL). General instructions  Schedule regular health, dental, and eye exams.  Stay current with your vaccines.  Tell your health care provider if: ? You often feel depressed. ? You have ever been abused or do not feel safe at home. Summary  Adopting a healthy lifestyle and getting preventive care are important in promoting health and wellness.  Follow your health care provider's instructions about healthy diet, exercising, and  getting tested or screened for diseases.  Follow your health care provider's instructions on monitoring your cholesterol and blood pressure. This information is not intended to replace advice given to you by your health care provider. Make sure you discuss any questions you have with your health care provider. Document Revised: 07/23/2018 Document Reviewed: 07/23/2018 Elsevier Patient Education  [place]. High Cholesterol  High cholesterol is a condition in which the blood has high levels of a white, waxy, fat-like substance (cholesterol). The human body needs small amounts of cholesterol. The liver makes all the cholesterol that the body needs. Extra (excess) cholesterol comes from the food that we eat. Cholesterol is carried from the liver by the blood through the blood vessels. If you have high cholesterol, deposits (plaques) may build up on the walls of your blood vessels (arteries). Plaques make the arteries narrower and stiffer. Cholesterol plaques increase your risk for heart attack and stroke. Work with your health care provider to keep your cholesterol levels in a healthy range. What increases the risk? This condition is more likely to develop in people who:  Eat foods that are high in animal fat (saturated fat) or cholesterol.  Are overweight.  Are not getting enough exercise.  Have a family history of high cholesterol. What are the signs or symptoms? There are no symptoms of this condition. How is this diagnosed? This condition may be diagnosed from the results of a blood test.  If you are older than age 51, your health care provider may check your cholesterol every 4-6 years.  You may be checked more often if you already have high cholesterol or other risk factors for heart disease. The blood test for cholesterol measures:  "Bad" cholesterol (LDL cholesterol). This is the main type of cholesterol that causes heart disease. The desired level for LDL is less than  100.  "Good" cholesterol (HDL cholesterol). This type helps to protect against heart disease by cleaning the arteries and carrying the LDL away. The desired level for HDL is 60 or higher.  Triglycerides. These are fats that the body can store or burn for energy. The desired number for triglycerides is lower than 150.  Total cholesterol. This is a measure of the total amount of cholesterol in your blood, including LDL cholesterol, HDL cholesterol, and triglycerides. A healthy number is less than 200. How is this treated? This condition is treated with diet changes, lifestyle changes, and medicines. Diet changes  This may include eating more whole grains, fruits, vegetables, nuts, and fish.  This may also include cutting back on red meat and foods that have a lot of added sugar. Lifestyle changes  Changes may include getting at least 40 minutes of aerobic exercise 3 times a week. Aerobic exercises include walking, biking, and swimming. Aerobic exercise along with a healthy diet can help you maintain a healthy weight.  Changes may also include quitting smoking. Medicines  Medicines are usually given if diet and lifestyle changes have failed to reduce your cholesterol to healthy levels.  Your health care provider may prescribe a statin medicine. Statin medicines have been shown to reduce cholesterol, which can reduce the risk of heart disease. Follow these instructions at home: Eating and drinking If told by your health care provider:  Eat chicken (without skin), fish, veal, shellfish, ground [place] breast, and round or loin cuts of red meat.  Do not eat fried foods or fatty meats, such as hot dogs and salami.  Eat plenty of fruits, such as apples.  Eat plenty of vegetables, such as broccoli, potatoes, and carrots.  Eat beans, peas, and lentils.  Eat grains such as barley, rice, couscous, and bulgur wheat.  Eat pasta without cream sauces.  Use skim or nonfat milk, and eat  low-fat or nonfat yogurt and cheeses.  Do not eat or drink whole milk, cream, ice cream, egg yolks, or hard cheeses.  Do not eat stick margarine or tub margarines that contain trans fats (also called partially hydrogenated oils).  Do not eat saturated tropical oils, such as coconut oil and palm oil.  Do not eat cakes, cookies, crackers, or other baked goods that contain trans fats.  General instructions  Exercise as directed by your health care provider. Increase your activity level with activities such as gardening, walking, and taking the stairs.  Take over-the-counter and prescription medicines only as told by your health care provider.  Do not use any products that contain nicotine or tobacco, such as cigarettes and e-cigarettes. If you need help quitting, ask your health care provider.  Keep all follow-up visits as told by your health care provider. This is important. Contact a health care provider if:  You are struggling to maintain a healthy diet or weight.  You need help to start on an exercise program.  You need help to stop smoking. Get help right away if:  You have chest pain.  You have trouble breathing. This information is not intended to replace advice given to you by your health care provider. Make sure you discuss any questions you have with your health care provider. Document Revised: 08/02/2017 Document Reviewed: 01/28/2016 Elsevier Patient Education  [place]. Influenza (Flu) Vaccine (Inactivated or Recombinant): What You Need to Know 1. Why get vaccinated? Influenza vaccine can prevent influenza (flu). Flu is a contagious disease that spreads around the [place] every year, usually between October and May. Anyone can get the flu, but it is more dangerous for some people. Infants and young children, people 16 years of age and older, pregnant women, and people with certain health conditions or a weakened immune system are at greatest risk of flu  complications. Pneumonia, bronchitis, sinus infections and ear infections are examples of flu-related complications. If you have a medical condition, such as heart disease, cancer or diabetes, flu can make it worse. Flu can cause fever and chills, sore throat, muscle aches, fatigue, cough, headache, and runny or stuffy nose. Some people may have  vomiting and diarrhea, though this is more common in children than adults. Each year thousands of people in the [place] die from flu, and many more are hospitalized. Flu vaccine prevents millions of illnesses and flu-related visits to the doctor each year. 2. Influenza vaccine CDC recommends everyone 62 months of age and older get vaccinated every flu season. Children 6 months through 18 years of age may need 2 doses during a single flu season. Everyone else needs only 1 dose each flu season. It takes about 2 weeks for protection to develop after vaccination. There are many flu viruses, and they are always changing. Each year a new flu vaccine is made to protect against three or four viruses that are likely to cause disease in the upcoming flu season. Even when the vaccine doesn't exactly match these viruses, it may still provide some protection. Influenza vaccine does not cause flu. Influenza vaccine may be given at the same time as other vaccines. 3. Talk with your health care provider Tell your vaccine provider if the person getting the vaccine:  Has had an allergic reaction after a previous dose of influenza vaccine, or has any severe, life-threatening allergies.  Has ever had Guillain-Barr Syndrome (also called GBS). In some cases, your health care provider may decide to postpone influenza vaccination to a future visit. People with minor illnesses, such as a cold, may be vaccinated. People who are moderately or severely ill should usually wait until they recover before getting influenza vaccine. Your health care provider can give you more  information. 4. Risks of a vaccine reaction  Soreness, redness, and swelling where shot is given, fever, muscle aches, and headache can happen after influenza vaccine.  There may be a very small increased risk of Guillain-Barr Syndrome (GBS) after inactivated influenza vaccine (the flu shot). Young children who get the flu shot along with pneumococcal vaccine (PCV13), and/or DTaP vaccine at the same time might be slightly more likely to have a seizure caused by fever. Tell your health care provider if a child who is getting flu vaccine has ever had a seizure. People sometimes faint after medical procedures, including vaccination. Tell your provider if you feel dizzy or have vision changes or ringing in the ears. As with any medicine, there is a very remote chance of a vaccine causing a severe allergic reaction, other serious injury, or death. 5. What if there is a serious problem? An allergic reaction could occur after the vaccinated person leaves the clinic. If you see signs of a severe allergic reaction (hives, swelling of the face and throat, difficulty breathing, a fast heartbeat, dizziness, or weakness), call 9-1-1 and get the person to the nearest hospital. For other signs that concern you, call your health care provider. Adverse reactions should be reported to the Vaccine Adverse Event Reporting System (VAERS). Your health care provider will usually file this report, or you can do it yourself. Visit the VAERS website at www.vaers.SamedayNews.es or call (671)648-6803.VAERS is only for reporting reactions, and VAERS staff do not give medical advice. 6. The National Vaccine Injury Compensation Program The [employer] Vaccine Injury Compensation Program (VICP) is a federal program that was created to compensate people who may have been injured by certain vaccines. Visit the VICP website at GoldCloset.com.ee or call (818)225-1306 to learn about the program and about filing a claim. There is a  time limit to file a claim for compensation. 7. How can I learn more?  Ask your healthcare provider.  Call your local  or state health department.  Contact the Centers for Disease Control and Prevention (CDC): ? Call 365-445-6468 (1-800-CDC-INFO) or ? Visit CDC's https://gibson.com/ Vaccine Information Statement (Interim) Inactivated Influenza Vaccine (03/27/2018) This information is not intended to replace advice given to you by your health care provider. Make sure you discuss any questions you have with your health care provider. Document Revised: 11/18/2018 Document Reviewed: 03/31/2018 Elsevier Patient Education  [place].

## 2020-06-17 LAB — CBC WITH DIFF/PLATELET
Basophils Absolute: 0 10*3/uL (ref 0.0–0.2)
Basos: 1 %
EOS (ABSOLUTE): 0.2 10*3/uL (ref 0.0–0.4)
Eos: 5 %
Hematocrit: 41.9 % (ref 34.0–46.6)
Hemoglobin: 13.8 g/dL (ref 11.1–15.9)
Immature Grans (Abs): 0 10*3/uL (ref 0.0–0.1)
Immature Granulocytes: 0 %
Lymphocytes Absolute: 1.7 10*3/uL (ref 0.7–3.1)
Lymphs: 41 %
MCH: 29.4 pg (ref 26.6–33.0)
MCHC: 32.9 g/dL (ref 31.5–35.7)
MCV: 89 fL (ref 79–97)
Monocytes Absolute: 0.5 10*3/uL (ref 0.1–0.9)
Monocytes: 12 %
Neutrophils Absolute: 1.8 10*3/uL (ref 1.4–7.0)
Neutrophils: 41 %
Platelets: 229 10*3/uL (ref 150–450)
RBC: 4.69 x10E6/uL (ref 3.77–5.28)
RDW: 12.4 % (ref 11.7–15.4)
WBC: 4.3 10*3/uL (ref 3.4–10.8)

## 2020-06-17 LAB — COMPREHENSIVE METABOLIC PANEL WITH GFR
ALT: 22 [IU]/L (ref 0–32)
AST: 32 [IU]/L (ref 0–40)
Albumin/Globulin Ratio: 1.6 (ref 1.2–2.2)
Albumin: 4.5 g/dL (ref 3.8–4.8)
Alkaline Phosphatase: 35 [IU]/L — ABNORMAL LOW (ref 44–121)
BUN/Creatinine Ratio: 20 (ref 12–28)
BUN: 17 mg/dL (ref 8–27)
Bilirubin Total: 0.5 mg/dL (ref 0.0–1.2)
CO2: 26 mmol/L (ref 20–29)
Calcium: 10.1 mg/dL (ref 8.7–10.3)
Chloride: 105 mmol/L (ref 96–106)
Creatinine, Ser: 0.85 mg/dL (ref 0.57–1.00)
GFR calc Af Amer: 81 mL/min/{1.73_m2}
GFR calc non Af Amer: 70 mL/min/{1.73_m2}
Globulin, Total: 2.8 g/dL (ref 1.5–4.5)
Glucose: 97 mg/dL (ref 65–99)
Potassium: 5.6 mmol/L — ABNORMAL HIGH (ref 3.5–5.2)
Sodium: 142 mmol/L (ref 134–144)
Total Protein: 7.3 g/dL (ref 6.0–8.5)

## 2020-06-17 LAB — LIPID PANEL
Chol/HDL Ratio: 3 ratio (ref 0.0–4.4)
Cholesterol, Total: 224 mg/dL — ABNORMAL HIGH (ref 100–199)
HDL: 75 mg/dL
LDL Chol Calc (NIH): 139 mg/dL — ABNORMAL HIGH (ref 0–99)
Triglycerides: 60 mg/dL (ref 0–149)
VLDL Cholesterol Cal: 10 mg/dL (ref 5–40)

## 2020-06-17 LAB — CARDIOVASCULAR RISK ASSESSMENT

## 2020-06-21 ENCOUNTER — Other Ambulatory Visit: Payer: Self-pay

## 2020-06-21 DIAGNOSIS — R799 Abnormal finding of blood chemistry, unspecified: Secondary | ICD-10-CM

## 2020-06-22 ENCOUNTER — Ambulatory Visit: Payer: PPO

## 2020-06-22 DIAGNOSIS — R799 Abnormal finding of blood chemistry, unspecified: Secondary | ICD-10-CM

## 2020-06-22 LAB — COMPREHENSIVE METABOLIC PANEL
ALT: 22 IU/L (ref 0–32)
AST: 31 IU/L (ref 0–40)
Albumin/Globulin Ratio: 1.6 (ref 1.2–2.2)
Albumin: 4.1 g/dL (ref 3.8–4.8)
Alkaline Phosphatase: 38 IU/L — ABNORMAL LOW (ref 44–121)
BUN/Creatinine Ratio: 18 (ref 12–28)
BUN: 15 mg/dL (ref 8–27)
Bilirubin Total: 0.5 mg/dL (ref 0.0–1.2)
CO2: 26 mmol/L (ref 20–29)
Calcium: 10.2 mg/dL (ref 8.7–10.3)
Chloride: 102 mmol/L (ref 96–106)
Creatinine, Ser: 0.82 mg/dL (ref 0.57–1.00)
GFR calc Af Amer: 84 mL/min/{1.73_m2} (ref >59)
GFR calc non Af Amer: 73 mL/min/{1.73_m2} (ref >59)
Globulin, Total: 2.6 g/dL (ref 1.5–4.5)
Glucose: 94 mg/dL (ref 65–99)
Potassium: 5.2 mmol/L (ref 3.5–5.2)
Sodium: 139 mmol/L (ref 134–144)
Total Protein: 6.7 g/dL (ref 6.0–8.5)

## 2020-07-18 DIAGNOSIS — H9190 Unspecified hearing loss, unspecified ear: Secondary | ICD-10-CM | POA: Diagnosis not present

## 2020-07-18 DIAGNOSIS — H61303 Acquired stenosis of external ear canal, unspecified, bilateral: Secondary | ICD-10-CM | POA: Diagnosis not present

## 2020-07-18 DIAGNOSIS — H9313 Tinnitus, bilateral: Secondary | ICD-10-CM | POA: Diagnosis not present

## 2020-07-18 DIAGNOSIS — H903 Sensorineural hearing loss, bilateral: Secondary | ICD-10-CM | POA: Diagnosis not present

## 2020-09-16 DIAGNOSIS — I10 Essential (primary) hypertension: Secondary | ICD-10-CM | POA: Diagnosis not present

## 2020-09-16 DIAGNOSIS — R0789 Other chest pain: Secondary | ICD-10-CM | POA: Diagnosis not present

## 2020-09-21 ENCOUNTER — Encounter: Payer: Self-pay | Admitting: Nurse Practitioner

## 2020-09-21 ENCOUNTER — Emergency Department (HOSPITAL_COMMUNITY)
Admission: EM | Admit: 2020-09-21 | Discharge: 2020-09-21 | Disposition: A | Discharge status: Home / Self Care | Payer: PPO | Attending: Emergency Medicine | Admitting: Emergency Medicine

## 2020-09-21 ENCOUNTER — Emergency Department (HOSPITAL_COMMUNITY): Payer: PPO

## 2020-09-21 ENCOUNTER — Other Ambulatory Visit: Payer: Self-pay

## 2020-09-21 ENCOUNTER — Ambulatory Visit (INDEPENDENT_AMBULATORY_CARE_PROVIDER_SITE_OTHER): Payer: PPO | Admitting: Nurse Practitioner

## 2020-09-21 VITALS — BP 158/68 | HR 68 | Temp 97.6°F | Ht 61.0 in | Wt 118.0 lb

## 2020-09-21 DIAGNOSIS — Z7982 Long term (current) use of aspirin: Secondary | ICD-10-CM | POA: Diagnosis not present

## 2020-09-21 DIAGNOSIS — R9431 Abnormal electrocardiogram [ECG] [EKG]: Secondary | ICD-10-CM | POA: Diagnosis not present

## 2020-09-21 DIAGNOSIS — Z79899 Other long term (current) drug therapy: Secondary | ICD-10-CM | POA: Diagnosis not present

## 2020-09-21 DIAGNOSIS — I10 Essential (primary) hypertension: Secondary | ICD-10-CM

## 2020-09-21 DIAGNOSIS — I208 Other forms of angina pectoris: Secondary | ICD-10-CM | POA: Diagnosis not present

## 2020-09-21 DIAGNOSIS — Z85828 Personal history of other malignant neoplasm of skin: Secondary | ICD-10-CM | POA: Diagnosis not present

## 2020-09-21 DIAGNOSIS — R079 Chest pain, unspecified: Secondary | ICD-10-CM | POA: Diagnosis not present

## 2020-09-21 DIAGNOSIS — R0789 Other chest pain: Secondary | ICD-10-CM | POA: Diagnosis not present

## 2020-09-21 LAB — BASIC METABOLIC PANEL
Anion gap: 8 (ref 5–15)
BUN: 9 mg/dL (ref 8–23)
CO2: 26 mmol/L (ref 22–32)
Calcium: 9.6 mg/dL (ref 8.9–10.3)
Chloride: 105 mmol/L (ref 98–111)
Creatinine, Ser: 0.81 mg/dL (ref 0.44–1.00)
GFR, Estimated: >60 mL/min (ref >60)
Glucose, Bld: 101 mg/dL — ABNORMAL HIGH (ref 70–99)
Potassium: 3.9 mmol/L (ref 3.5–5.1)
Sodium: 139 mmol/L (ref 135–145)

## 2020-09-21 LAB — CBC
HCT: 38.6 % (ref 36.0–46.0)
Hemoglobin: 13.1 g/dL (ref 12.0–15.0)
MCH: 30.6 pg (ref 26.0–34.0)
MCHC: 33.9 g/dL (ref 30.0–36.0)
MCV: 90.2 fL (ref 80.0–100.0)
Platelets: 216 10*3/uL (ref 150–400)
RBC: 4.28 MIL/uL (ref 3.87–5.11)
RDW: 11.7 % (ref 11.5–15.5)
WBC: 4.3 10*3/uL (ref 4.0–10.5)
nRBC: 0 % (ref 0.0–0.2)

## 2020-09-21 LAB — TROPONIN I (HIGH SENSITIVITY)
Troponin I (High Sensitivity): 3 ng/L (ref <18)
Troponin I (High Sensitivity): 3 ng/L (ref <18)

## 2020-09-21 MED ORDER — NITROGLYCERIN 0.4 MG SL SUBL: 0.4000 mg | SUBLINGUAL_TABLET | SUBLINGUAL | 3 refills | Status: DC | PRN

## 2020-09-21 NOTE — Progress Notes (Signed)
Subjective:  Patient ID: Crystal Oneal, female    DOB: [birth year]  Age: 70 y.o. MRN: [id number]  Chief Complaint  Patient presents with  . Hospitalization Follow-up    Elevated blood pressure    HPI  Crystal Oneal is a 70 year old Caucasian that presents for follow-up of uncontrolled hypertension. She presented to [facility] ED on Friday, February 4th, 2022 with elevated BP of 225/106.She was given Clonidine 0.1 mg. An EKG was performed and labs were drawn. Troponin less than 0.01x2. She was discharged with prescription of Clonidine 0.1 mg Q6 hours PRN for chest pain and discomfort.   Today in office verbalizes bilateral leg weakness, bilateral shoulder "achiness and tightness" that radiates to left arm.She says she is experiencing mild chest tightness and pain with rest. BP 158/68, pulse 68. She denies nausea or dyspnea. She states she did have 3 episodes of diarrhea this morning. She was give 81 mg ASA x4 and NTG 0.4 mg at 1446. Chest pain and bilateral shoulder discomfort subsided approximately 5 minutes later. Repeat BP 148/82. Crystal Oneal tells me both her paternal grandparents died from acute MI in their early 50s. She states her father had a cardiac arrhythmia in his early 53s. EKG revealed changes compared to hospital EKG performed on 09/16/20. EMS was called to transport her to area hospital for evaluation.   Current Outpatient Medications on File Prior to Visit  Medication Sig Dispense Refill  . alendronate (FOSAMAX) 70 MG tablet Take 1 tablet (70 mg total) by mouth every Friday. Take with a full glass of water on an empty stomach. 12 tablet 2  . aspirin EC 81 MG tablet Take 81 mg by mouth daily.    Marland Kitchen b complex vitamins tablet Take 1 tablet by mouth daily.    . calcium citrate-vitamin D (CITRACAL+D) 315-200 MG-UNIT tablet Take 2 tablets by mouth 2 (two) times daily.    . cetirizine (ZYRTEC) 10 MG tablet Take 5 mg by mouth daily as needed for allergies.    . Cholecalciferol (VITAMIN  D) 2000 units tablet Take 2,000 Units by mouth daily.    . cloNIDine (CATAPRES) 0.1 MG tablet Take 0.1 mg by mouth every 6 (six) hours as needed. Bp reading over 160/90    . fluticasone (FLONASE) 50 MCG/ACT nasal spray Place 1 spray into both nostrils daily as needed for allergies or rhinitis.    . Ginger, Zingiber officinalis, (GINGER ROOT) 550 MG CAPS Take 550 mg by mouth daily.    Marland Kitchen losartan (COZAAR) 25 MG tablet Take 1 tablet (25 mg total) by mouth daily. 90 tablet 0  . Omega-3 Fatty Acids (FISH OIL PO) Take 1,280 mg by mouth daily.    . pantoprazole (PROTONIX) 40 MG tablet 40 mg.    . TURMERIC CURCUMIN PO Take 750 mg by mouth daily.    . vitamin C (ASCORBIC ACID) 500 MG tablet Take 500 mg by mouth daily.     No current facility-administered medications on file prior to visit.   Past Medical History:  Diagnosis Date  . Age-related osteoporosis without current pathological fracture   . Arthritis   . Cancer ([place]) 04/2020   SCC skin cancer removed from nose  . Cervical stenosis of spine    C11-12  . Dysphagia    Esophageal dilation x2  . Essential (primary) hypertension   . GERD (gastroesophageal reflux disease)   . Hypertension   . Mixed hyperlipidemia   . PONV (postoperative nausea and vomiting)   . Spondylolisthesis of lumbar  region    Past Surgical History:  Procedure Laterality Date  . ABDOMINAL HYSTERECTOMY    . ANTERIOR AND POSTERIOR REPAIR N/A 03/20/2016   Procedure: REPAIR CYSTOCELE AND RECTOCELE;  Surgeon: Bjorn Loser, MD;  Location: [facility];  Service: Urology;  Laterality: N/A;  . BACK SURGERY  2019   has hardware  . COLONOSCOPY    . ESOPHAGEAL DILATION  2005   x 2  . 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 VAGINAL HYSTERECTOMY Bilateral 03/20/2016   Procedure: TOTAL VAGINAL HYSTERECTOMY WITH BILATERAL SALPINGO OOPHORECTOMY mccall coloplasty;  Surgeon: Servando Salina, MD;  Location: [facility];  Service: Gynecology;  Laterality: Bilateral;   . VAGINAL PROLAPSE REPAIR N/A 03/20/2016   Procedure: REPAIR VAULT PROLAPSE AND GRAFT;  Surgeon: Bjorn Loser, MD;  Location: [facility];  Service: Urology;  Laterality: N/A;    Family History  Problem Relation Age of Onset  . Uterine cancer Mother   . Breast cancer Mother   . Lung disease Father   . Breast cancer Sister   . Multiple myeloma Brother    Social History   Socioeconomic History  . Marital status: Married    Spouse name: Not on file  . Number of children: 2  . Years of education: Not on file  . Highest education level: Not on file  Occupational History  . Not on file  Tobacco Use  . Smoking status: Never Smoker  . Smokeless tobacco: Never Used  Vaping Use  . Vaping Use: Never used  Substance and Sexual Activity  . Alcohol use: Yes    Alcohol/week: 1.0 standard drink    Types: 1 Glasses of wine per week    Comment: rarely  . Drug use: Never  . Sexual activity: Not on file  Other Topics Concern  . Not on file  Social History Narrative  . Not on file   Social Determinants of Health   Financial Resource Strain: Low Risk   . Difficulty of Paying Living Expenses: Not hard at all  Food Insecurity: No Food Insecurity  . Worried About Charity fundraiser in the Last Year: Never true  . Ran Out of Food in the Last Year: Never true  Transportation Needs: No Transportation Needs  . Lack of Transportation (Medical): No  . Lack of Transportation (Non-Medical): No  Physical Activity: Sufficiently Active  . Days of Exercise per Week: 5 days  . Minutes of Exercise per Session: 30 min  Stress: No Stress Concern Present  . Feeling of Stress : Not at all  Social Connections: Socially Integrated  . Frequency of Communication with Friends and Family: More than three times a week  . Frequency of Social Gatherings with Friends and Family: Twice a week  . Attends Religious Services: More than 4 times per year  . Active Member of Clubs or Organizations: Yes  . Attends English as a second language teacher Meetings: More than 4 times per year  . Marital Status: Married    Review of Systems  Constitutional: Negative for fatigue and fever.  HENT: Negative for congestion, ear pain, sinus pressure and sore throat.   Eyes: Negative for pain.  Respiratory: Positive for chest tightness (Feels like shoulders are squeezing together.). Negative for cough, shortness of breath and wheezing.   Cardiovascular: Positive for chest pain. Negative for palpitations.  Gastrointestinal: Positive for diarrhea. Negative for abdominal pain, constipation, nausea and vomiting.  Genitourinary: Negative for dysuria and hematuria.  Musculoskeletal: Positive for  arthralgias and myalgias. Negative for back pain and joint swelling.       Bilateral shoulder "achiness and tightness" radiating to left arm, subsided after NTG and ASA.  Skin: Negative for rash.  Neurological: Negative for dizziness, weakness and headaches.  Psychiatric/Behavioral: Negative for dysphoric mood. The patient is not nervous/anxious.      Objective:  BP (!) 158/68 (BP Location: Left Arm, Patient Position: Sitting)   Pulse 68   Temp 97.6 F (36.4 C) (Temporal)   Ht 5' 1"  (1.549 m)   Wt 118 lb (53.5 kg)   SpO2 98%   BMI 22.30 kg/m   BP/Weight 09/21/2020 06/16/2020 7/54/3606  Systolic BP 770 340 352  Diastolic BP 68 80 78  Wt. (Lbs) 118 116.4 115  BMI 22.3 21.99 21.73    Physical Exam Vitals reviewed.  Constitutional:      Appearance: Normal appearance. She is normal weight.  HENT:     Head: Normocephalic.     Nose: Nose normal.     Mouth/Throat:     Mouth: Mucous membranes are moist.  Cardiovascular:     Rate and Rhythm: Normal rate and regular rhythm.     Pulses: Normal pulses.     Heart sounds: Normal heart sounds.  Pulmonary:     Effort: Pulmonary effort is normal.     Breath sounds: Normal breath sounds.  Musculoskeletal:        General: Tenderness present.     Cervical back: Normal range of motion.   Skin:    General: Skin is warm and dry.  Neurological:     General: No focal deficit present.     Mental Status: She is alert and oriented to person, place, and time.  Psychiatric:        Mood and Affect: Mood normal.        Behavior: Behavior normal.        Thought Content: Thought content normal.        Judgment: Judgment normal.     Diabetic Foot Exam - Simple   No data filed      Lab Results  Component Value Date   WBC 4.3 06/16/2020   HGB 13.8 06/16/2020   HCT 41.9 06/16/2020   PLT 229 06/16/2020   GLUCOSE 94 06/22/2020   CHOL 224 (H) 06/16/2020   TRIG 60 06/16/2020   HDL 75 06/16/2020   LDLCALC 139 (H) 06/16/2020   ALT 22 06/22/2020   AST 31 06/22/2020   NA 139 06/22/2020   K 5.2 06/22/2020   CL 102 06/22/2020   CREATININE 0.82 06/22/2020   BUN 15 06/22/2020   CO2 26 06/22/2020   INR 0.95 03/15/2016      Assessment & Plan:   1. Angina at rest [facility]) - nitroGLYCERIN (NITROSTAT) 0.4 MG SL tablet; Place 1 tablet (0.4 mg total) under the tongue every 5 (five) minutes as needed for chest pain.  Dispense: 50 tablet; Refill: 3 - EKG 12-Lead  2. Essential hypertension, malignant -continue Losartan 25 mg daily -continue Clonidine 0.88m as directed -Follow up with Dr MBettina Gavia cardiology as directed     Risk factors: hypertension and family history of acute cardiac death in early 76s  [facility]  Follow-up: Post hospital and cardiologist evaluation  An After Visit Summary was printed and given to the patient.  SRip Harbour NP [place]([phone]

## 2020-09-21 NOTE — Discharge Instructions (Addendum)
Please call your primary care office to get an appointment for close follow-up.  Additionally would recommend close follow-up with cardiology.  If you have any worsening episodes of chest pain, difficulty breathing or other new concerning symptom, return to ER for reassessment.

## 2020-09-21 NOTE — ED Triage Notes (Signed)
Patient here for evaluation of intermittent chest tightness and shoulder pain that started last Friday and got worse today. Patient was seen at ED on Friday and told to follow up with cardiology but patient has seen them yet. Was given clonidine rx from ED, took dose at 0800 this morning. Current BP 193/85. Pain 1/10 at this time.

## 2020-09-21 NOTE — ED Notes (Signed)
Pt d/c by md and is provided w/ d/c instructions and follow up care, Pt is out of the ED Ambulatory with family

## 2020-09-21 NOTE — ED Notes (Signed)
Patient BIB EMS for chest pain  Received 324mg  asa 20g left AC 1x SL NTG Chest pain 3/10, nothing makes better or worse

## 2020-09-22 NOTE — ED Provider Notes (Signed)
[facility] Provider Note   CSN: [id number] Arrival date & time: 09/21/20  1611     History Chief Complaint  Patient presents with  . Chest Pain    Crystal Oneal is a 70 y.o. female.  Presents to ER with concern for chest pain.  Patient reports that 1 week ago she had a couple episodes of chest pain and went to the ER had at [facility] on February 4.  Reports her heart enzymes were normal but her blood pressure was elevated and she was given a prescription for clonidine.  Follow-up with her primary doctor today.  Has appointment with cardiology in a few weeks.  Had an episode of pain earlier today, across chest, mild to moderate, occurring at rest, not associated with exertion, nonradiating.  Lasted for a couple hours but then resolved spontaneously.  No ongoing pain at present.  Report grandparents with coronary artery disease in 108s, her father had A. fib but no coronary artery disease.  HPI     Past Medical History:  Diagnosis Date  . Age-related osteoporosis without current pathological fracture   . Arthritis   . Cancer ([place]) 04/2020   SCC skin cancer removed from nose  . Cervical stenosis of spine    C11-12  . Dysphagia    Esophageal dilation x2  . Essential (primary) hypertension   . GERD (gastroesophageal reflux disease)   . Hypertension   . Mixed hyperlipidemia   . PONV (postoperative nausea and vomiting)   . Spondylolisthesis of lumbar region     Patient Active Problem List   Diagnosis Date Noted  . Essential (primary) hypertension   . Mixed hyperlipidemia   . Right foot pain 02/03/2020  . Spondylolisthesis of lumbar region 01/02/2018  . Cystocele 03/20/2016    Past Surgical History:  Procedure Laterality Date  . ABDOMINAL HYSTERECTOMY    . ANTERIOR AND POSTERIOR REPAIR N/A 03/20/2016   Procedure: REPAIR CYSTOCELE AND RECTOCELE;  Surgeon: Bjorn Loser, MD;  Location: [facility];  Service: Urology;  Laterality:  N/A;  . BACK SURGERY  2019   has hardware  . COLONOSCOPY    . ESOPHAGEAL DILATION  2005   x 2  . 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 VAGINAL HYSTERECTOMY Bilateral 03/20/2016   Procedure: TOTAL VAGINAL HYSTERECTOMY WITH BILATERAL SALPINGO OOPHORECTOMY mccall coloplasty;  Surgeon: Servando Salina, MD;  Location: [facility];  Service: Gynecology;  Laterality: Bilateral;  . VAGINAL PROLAPSE REPAIR N/A 03/20/2016   Procedure: REPAIR VAULT PROLAPSE AND GRAFT;  Surgeon: Bjorn Loser, MD;  Location: [facility];  Service: Urology;  Laterality: N/A;     OB History   No obstetric history on file.     Family History  Problem Relation Age of Onset  . Uterine cancer Mother   . Breast cancer Mother   . Lung disease Father   . Breast cancer Sister   . Multiple myeloma Brother     Social History   Tobacco Use  . Smoking status: Never Smoker  . Smokeless tobacco: Never Used  Vaping Use  . Vaping Use: Never used  Substance Use Topics  . Alcohol use: Yes    Alcohol/week: 1.0 standard drink    Types: 1 Glasses of wine per week    Comment: rarely  . Drug use: Never    Home Medications Prior to Admission medications   Medication Sig Start Date End Date Taking? Authorizing Provider  alendronate (  FOSAMAX) 70 MG tablet Take 1 tablet (70 mg total) by mouth every Friday. Take with a full glass of water on an empty stomach. 06/17/20   Rip Harbour, NP  aspirin EC 81 MG tablet Take 81 mg by mouth daily.    [provider]  b complex vitamins tablet Take 1 tablet by mouth daily.    [provider]  calcium citrate-vitamin D (CITRACAL+D) 315-200 MG-UNIT tablet Take 2 tablets by mouth 2 (two) times daily.    [provider]  cetirizine (ZYRTEC) 10 MG tablet Take 5 mg by mouth daily as needed for allergies.    [provider]  Cholecalciferol (VITAMIN D) 2000 units tablet Take 2,000 Units by mouth daily.    [provider]   cloNIDine (CATAPRES) 0.1 MG tablet Take 0.1 mg by mouth every 6 (six) hours as needed. Bp reading over 160/90 09/16/20   [provider]  fluticasone (FLONASE) 50 MCG/ACT nasal spray Place 1 spray into both nostrils daily as needed for allergies or rhinitis.    [provider]  Ginger, Zingiber officinalis, (GINGER ROOT) 550 MG CAPS Take 550 mg by mouth daily.    [provider]  losartan (COZAAR) 25 MG tablet Take 1 tablet (25 mg total) by mouth daily. 03/28/20   Marge Duncans, PA-C  nitroGLYCERIN (NITROSTAT) 0.4 MG SL tablet Place 1 tablet (0.4 mg total) under the tongue every 5 (five) minutes as needed for chest pain. 09/21/20   Rip Harbour, NP  Omega-3 Fatty Acids (FISH OIL PO) Take 1,280 mg by mouth daily.    [provider]  pantoprazole (PROTONIX) 40 MG tablet 40 mg. 04/26/17   [provider]  TURMERIC CURCUMIN PO Take 750 mg by mouth daily.    [provider]  vitamin C (ASCORBIC ACID) 500 MG tablet Take 500 mg by mouth daily.    [provider]    Allergies    Pneumovax 23 [pneumococcal vac polyvalent], Lisinopril, and Sulfa antibiotics  Review of Systems   Review of Systems  Constitutional: Negative for chills and fever.  HENT: Negative for ear pain and sore throat.   Eyes: Negative for pain and visual disturbance.  Respiratory: Negative for cough and shortness of breath.   Cardiovascular: Positive for chest pain. Negative for palpitations.  Gastrointestinal: Negative for abdominal pain and vomiting.  Genitourinary: Negative for dysuria and hematuria.  Musculoskeletal: Negative for arthralgias and back pain.  Skin: Negative for color change and rash.  Neurological: Negative for seizures and syncope.  All other systems reviewed and are negative.   Physical Exam Updated Vital Signs BP (!) 143/83   Pulse 71   Temp 98.3 F (36.8 C) (Oral)   Resp 16   Ht _0  (1.549 m)   Wt 53.5 kg   SpO2 99%   BMI 22.29  kg/m   Physical Exam Vitals and nursing note reviewed.  Constitutional:      General: She is not in acute distress.    Appearance: She is well-developed and well-nourished.  HENT:     Head: Normocephalic and atraumatic.  Eyes:     Conjunctiva/sclera: Conjunctivae normal.  Cardiovascular:     Rate and Rhythm: Normal rate and regular rhythm.     Heart sounds: No murmur heard.   Pulmonary:     Effort: Pulmonary effort is normal. No respiratory distress.     Breath sounds: Normal breath sounds.  Chest:     Chest wall: No tenderness or  crepitus.  Abdominal:     Palpations: Abdomen is soft.     Tenderness: There is no abdominal tenderness.  Musculoskeletal:        General: No edema.     Cervical back: Neck supple.  Skin:    General: Skin is warm and dry.  Neurological:     General: No focal deficit present.     Mental Status: She is alert.  Psychiatric:        Mood and Affect: Mood and affect normal.     ED Results / Procedures / Treatments   Labs (all labs ordered are listed, but only abnormal results are displayed) Labs Reviewed  BASIC METABOLIC PANEL - Abnormal; Notable for the following components:      Result Value   Glucose, Bld 101 (*)    All other components within normal limits  CBC  TROPONIN I (HIGH SENSITIVITY)  TROPONIN I (HIGH SENSITIVITY)    EKG EKG Interpretation  Date/Time:  Wednesday September 21 2020 16:42:45 EST Ventricular Rate:  63 PR Interval:  206 QRS Duration: 76 QT Interval:  400 QTC Calculation: 409 R Axis:   44 Text Interpretation: Normal sinus rhythm Cannot rule out Anteroseptal infarct , age undetermined Abnormal ECG no prior for comparison Confirmed by Madalyn Rob [phone]) on 09/21/2020 5:39:48 PM   Radiology DG Chest 2 View  Result Date: 09/21/2020 CLINICAL DATA:  Intermittent chest tightness and shoulder pain since last week, worsening today EXAM: CHEST - 2 VIEW COMPARISON:  09/16/2020 FINDINGS: Frontal and lateral views of  the chest demonstrate an unremarkable cardiac silhouette. Chronic areas of scarring without airspace disease, effusion, or pneumothorax. No acute bony abnormalities. Postsurgical changes lower lumbar spine. IMPRESSION: 1. Stable exam, no acute process. Electronically Signed   By: Randa Ngo M.D.   On: 09/21/2020 17:26    Procedures Procedures   Medications Ordered in ED Medications - No data to display  ED Course  I have reviewed the triage vital signs and the nursing notes.  Pertinent labs & imaging results that were available during my care of the patient were reviewed by me and considered in my medical decision making (see chart for details).    MDM Rules/Calculators/A&P                         70 year old lady presents to ER with concern for chest pain and elevated blood pressure.  When I assessed patient, she was well-appearing and had no ongoing pain.  EKG does not demonstrate any acute ischemic change and her troponin x2 was within normal limits.  Given these findings, very low suspicion for ACS.  Her initial BP was elevated but repeat BP without interval was near normal.  She continues to have no ongoing symptoms.  Given the reassuring work-up and lack of ongoing symptoms, believe patient can be managed in the outpatient setting.  Recommend that she follow-up again with her primary doctor as well as her cardiologist to discuss blood pressure management as well as discuss need for any additional outpatient cardiac testing (echo, stress, etc.).   After the discussed management above, the patient was determined to be safe for discharge.  The patient was in agreement with this plan and all questions regarding their care were answered.  ED return precautions were discussed and the patient will return to the ED with any significant worsening of condition.   Final Clinical Impression(s) / ED Diagnoses Final diagnoses:  Chest pain, unspecified type  Rx / DC Orders ED Discharge  Orders    None       Lucrezia Starch, MD 09/22/20 [phone]

## 2020-09-26 ENCOUNTER — Encounter: Payer: Self-pay | Admitting: Nurse Practitioner

## 2020-09-26 ENCOUNTER — Other Ambulatory Visit: Payer: Self-pay

## 2020-09-26 ENCOUNTER — Ambulatory Visit (INDEPENDENT_AMBULATORY_CARE_PROVIDER_SITE_OTHER): Payer: PPO | Admitting: Nurse Practitioner

## 2020-09-26 VITALS — BP 162/88 | HR 78 | Temp 97.3°F | Ht 61.0 in | Wt 116.0 lb

## 2020-09-26 DIAGNOSIS — K219 Gastro-esophageal reflux disease without esophagitis: Secondary | ICD-10-CM | POA: Diagnosis not present

## 2020-09-26 DIAGNOSIS — I209 Angina pectoris, unspecified: Secondary | ICD-10-CM | POA: Diagnosis not present

## 2020-09-26 DIAGNOSIS — I10 Essential (primary) hypertension: Secondary | ICD-10-CM

## 2020-09-26 MED ORDER — PANTOPRAZOLE SODIUM 40 MG PO TBEC: 40.0000 mg | DELAYED_RELEASE_TABLET | Freq: Every day | ORAL | 1 refills | Status: DC

## 2020-09-26 MED ORDER — LOSARTAN POTASSIUM 50 MG PO TABS: 50.0000 mg | ORAL_TABLET | Freq: Every day | ORAL | 0 refills | Status: DC

## 2020-09-26 NOTE — Patient Instructions (Signed)
Increase Losartan to 50 mg daily Monitor BP at home, notify office of any problems Keep appt with cardiology Resume Protonix 40 mg daily Return in 1-week for BP check (nurse visit)   Blood Pressure Record Sheet To take your blood pressure, you will need a blood pressure machine. You can buy a blood pressure machine (blood pressure monitor) at your clinic, drug store, or online. When choosing one, consider:  An automatic monitor that has an arm cuff.  A cuff that wraps snugly around your upper arm. You should be able to fit only one finger between your arm and the cuff.  A device that stores blood pressure reading results.  Do not choose a monitor that measures your blood pressure from your wrist or finger. Follow your health care provider's instructions for how to take your blood pressure. To use this form:  Get one reading in the morning (a.m.) before you take any medicines.  Get one reading in the evening (p.m.) before supper.  Take at least 2 readings with each blood pressure check. This makes sure the results are correct. Wait 1-2 minutes between measurements.  Write down the results in the spaces on this form.  Repeat this once a week, or as told by your health care provider.  Make a follow-up appointment with your health care provider to discuss the results. Blood pressure log Date: _______________________  a.m. _____________________(1st reading) _____________________(2nd reading)  p.m. _____________________(1st reading) _____________________(2nd reading) Date: _______________________  a.m. _____________________(1st reading) _____________________(2nd reading)  p.m. _____________________(1st reading) _____________________(2nd reading) Date: _______________________  a.m. _____________________(1st reading) _____________________(2nd reading)  p.m. _____________________(1st reading) _____________________(2nd reading) Date: _______________________  a.m.  _____________________(1st reading) _____________________(2nd reading)  p.m. _____________________(1st reading) _____________________(2nd reading) Date: _______________________  a.m. _____________________(1st reading) _____________________(2nd reading)  p.m. _____________________(1st reading) _____________________(2nd reading) This information is not intended to replace advice given to you by your health care provider. Make sure you discuss any questions you have with your health care provider. Document Revised: 11/18/2019 Document Reviewed: 11/18/2019 Elsevier Patient Education  2021 [place] Your Hypertension Hypertension, also called high blood pressure, is when the force of the blood pressing against the walls of the arteries is too strong. Arteries are blood vessels that carry blood from your heart throughout your body. Hypertension forces the heart to work harder to pump blood and may cause the arteries to become narrow or stiff. Understanding blood pressure readings Your personal target blood pressure may vary depending on your medical conditions, your age, and other factors. A blood pressure reading includes a higher number over a lower number. Ideally, your blood pressure should be below 120/80. You should know that:  The first, or top, number is called the systolic pressure. It is a measure of the pressure in your arteries as your heart beats.  The second, or bottom number, is called the diastolic pressure. It is a measure of the pressure in your arteries as the heart relaxes. Blood pressure is classified into four stages. Based on your blood pressure reading, your health care provider may use the following stages to determine what type of treatment you need, if any. Systolic pressure and diastolic pressure are measured in a unit called mmHg. Normal  Systolic pressure: below 196.  Diastolic pressure: below 80. Elevated  Systolic pressure: 222-979.  Diastolic  pressure: below 80. Hypertension stage 1  Systolic pressure: 892-119.  Diastolic pressure: 41-74. Hypertension stage 2  Systolic pressure: 081 or above.  Diastolic pressure: 90 or above. How can this condition affect  me? Managing your hypertension is an important responsibility. Over time, hypertension can damage the arteries and decrease blood flow to important parts of the body, including the brain, heart, and kidneys. Having untreated or uncontrolled hypertension can lead to:  A heart attack.  A stroke.  A weakened blood vessel (aneurysm).  Heart failure.  Kidney damage.  Eye damage.  Metabolic syndrome.  Memory and concentration problems.  Vascular dementia. What actions can I take to manage this condition? Hypertension can be managed by making lifestyle changes and possibly by taking medicines. Your health care provider will help you make a plan to bring your blood pressure within a normal range. Nutrition  Eat a diet that is high in fiber and potassium, and low in salt (sodium), added sugar, and fat. An example eating plan is called the Dietary Approaches to Stop Hypertension (DASH) diet. To eat this way: ? Eat plenty of fresh fruits and vegetables. Try to fill one-half of your plate at each meal with fruits and vegetables. ? Eat whole grains, such as whole-wheat pasta, brown rice, or whole-grain bread. Fill about one-fourth of your plate with whole grains. ? Eat low-fat dairy products. ? Avoid fatty cuts of meat, processed or cured meats, and poultry with skin. Fill about one-fourth of your plate with lean proteins such as fish, chicken without skin, beans, eggs, and tofu. ? Avoid pre-made and processed foods. These tend to be higher in sodium, added sugar, and fat.  Reduce your daily sodium intake. Most people with hypertension should eat less than 1,500 mg of sodium a day.   Lifestyle  Work with your health care provider to maintain a healthy body weight or to  lose weight. Ask what an ideal weight is for you.  Get at least 30 minutes of exercise that causes your heart to beat faster (aerobic exercise) most days of the week. Activities may include walking, swimming, or biking.  Include exercise to strengthen your muscles (resistance exercise), such as weight lifting, as part of your weekly exercise routine. Try to do these types of exercises for 30 minutes at least 3 days a week.  Do not use any products that contain nicotine or tobacco, such as cigarettes, e-cigarettes, and chewing tobacco. If you need help quitting, ask your health care provider.  Control any long-term (chronic) conditions you have, such as high cholesterol or diabetes.  Identify your sources of stress and find ways to manage stress. This may include meditation, deep breathing, or making time for fun activities.   Alcohol use  Do not drink alcohol if: ? Your health care provider tells you not to drink. ? You are pregnant, may be pregnant, or are planning to become pregnant.  If you drink alcohol: ? Limit how much you use to:  0-1 drink a day for women.  0-2 drinks a day for men. ? Be aware of how much alcohol is in your drink. In the [place] one drink equals one 12 oz bottle of beer (355 mL), one 5 oz glass of wine (148 mL), or one 1 oz glass of hard liquor (44 mL). Medicines Your health care provider may prescribe medicine if lifestyle changes are not enough to get your blood pressure under control and if:  Your systolic blood pressure is 130 or higher.  Your diastolic blood pressure is 80 or higher. Take medicines only as told by your health care provider. Follow the directions carefully. Blood pressure medicines must be taken as told by your health care  provider. The medicine does not work as well when you skip doses. Skipping doses also puts you at risk for problems. Monitoring Before you monitor your blood pressure:  Do not smoke, drink caffeinated beverages, or  exercise within 30 minutes before taking a measurement.  Use the bathroom and empty your bladder (urinate).  Sit quietly for at least 5 minutes before taking measurements. Monitor your blood pressure at home as told by your health care provider. To do this:  Sit with your back straight and supported.  Place your feet flat on the floor. Do not cross your legs.  Support your arm on a flat surface, such as a table. Make sure your upper arm is at heart level.  Each time you measure, take two or three readings one minute apart and record the results. You may also need to have your blood pressure checked regularly by your health care provider.   General information  Talk with your health care provider about your diet, exercise habits, and other lifestyle factors that may be contributing to hypertension.  Review all the medicines you take with your health care provider because there may be side effects or interactions.  Keep all visits as told by your health care provider. Your health care provider can help you create and adjust your plan for managing your high blood pressure. Where to find more information  National Heart, Lung, and Blood Institute: https://wilson-eaton.com/  American Heart Association: www.heart.org Contact a health care provider if:  You think you are having a reaction to medicines you have taken.  You have repeated (recurrent) headaches.  You feel dizzy.  You have swelling in your ankles.  You have trouble with your vision. Get help right away if:  You develop a severe headache or confusion.  You have unusual weakness or numbness, or you feel faint.  You have severe pain in your chest or abdomen.  You vomit repeatedly.  You have trouble breathing. These symptoms may represent a serious problem that is an emergency. Do not wait to see if the symptoms will go away. Get medical help right away. Call your local emergency services (911 in the [place] Do not drive  yourself to the hospital. Summary  Hypertension is when the force of blood pumping through your arteries is too strong. If this condition is not controlled, it may put you at risk for serious complications.  Your personal target blood pressure may vary depending on your medical conditions, your age, and other factors. For most people, a normal blood pressure is less than 120/80.  Hypertension is managed by lifestyle changes, medicines, or both.  Lifestyle changes to help manage hypertension include losing weight, eating a healthy, low-sodium diet, exercising more, stopping smoking, and limiting alcohol. This information is not intended to replace advice given to you by your health care provider. Make sure you discuss any questions you have with your health care provider. Document Revised: 09/04/2019 Document Reviewed: 06/30/2019 Elsevier Patient Education  2021 [place].  [email or web address]">  DASH Eating Plan DASH stands for Dietary Approaches to Stop Hypertension. The DASH eating plan is a healthy eating plan that has been shown to:  Reduce high blood pressure (hypertension).  Reduce your risk for type 2 diabetes, heart disease, and stroke.  Help with weight loss. What are tips for following this plan? Reading food labels  Check food labels for the amount of salt (sodium) per serving. Choose foods with less than 5 percent of the Daily Value of sodium. Generally,  foods with less than 300 milligrams (mg) of sodium per serving fit into this eating plan.  To find whole grains, look for the word "whole" as the first word in the ingredient list. Shopping  Buy products labeled as "low-sodium" or "no salt added."  Buy fresh foods. Avoid canned foods and pre-made or frozen meals. Cooking  Avoid adding salt when cooking. Use salt-free seasonings or herbs instead of table salt or sea salt. Check with your health care provider or pharmacist  before using salt substitutes.  Do not fry foods. Cook foods using healthy methods such as baking, boiling, grilling, roasting, and broiling instead.  Cook with heart-healthy oils, such as olive, canola, avocado, soybean, or sunflower oil. Meal planning  Eat a balanced diet that includes: ? 4 or more servings of fruits and 4 or more servings of vegetables each day. Try to fill one-half of your plate with fruits and vegetables. ? 6-8 servings of whole grains each day. ? Less than 6 oz (170 g) of lean meat, poultry, or fish each day. A 3-oz (85-g) serving of meat is about the same size as a deck of cards. One egg equals 1 oz (28 g). ? 2-3 servings of low-fat dairy each day. One serving is 1 cup (237 mL). ? 1 serving of nuts, seeds, or beans 5 times each week. ? 2-3 servings of heart-healthy fats. Healthy fats called omega-3 fatty acids are found in foods such as walnuts, flaxseeds, fortified milks, and eggs. These fats are also found in cold-water fish, such as sardines, salmon, and mackerel.  Limit how much you eat of: ? Canned or prepackaged foods. ? Food that is high in trans fat, such as some fried foods. ? Food that is high in saturated fat, such as fatty meat. ? Desserts and other sweets, sugary drinks, and other foods with added sugar. ? Full-fat dairy products.  Do not salt foods before eating.  Do not eat more than 4 egg yolks a week.  Try to eat at least 2 vegetarian meals a week.  Eat more home-cooked food and less restaurant, buffet, and fast food.   Lifestyle  When eating at a restaurant, ask that your food be prepared with less salt or no salt, if possible.  If you drink alcohol: ? Limit how much you use to:  0-1 drink a day for women who are not pregnant.  0-2 drinks a day for men. ? Be aware of how much alcohol is in your drink. In the [place] one drink equals one 12 oz bottle of beer (355 mL), one 5 oz glass of wine (148 mL), or one 1 oz glass of hard liquor (44  mL). General information  Avoid eating more than 2,300 mg of salt a day. If you have hypertension, you may need to reduce your sodium intake to 1,500 mg a day.  Work with your health care provider to maintain a healthy body weight or to lose weight. Ask what an ideal weight is for you.  Get at least 30 minutes of exercise that causes your heart to beat faster (aerobic exercise) most days of the week. Activities may include walking, swimming, or biking.  Work with your health care provider or dietitian to adjust your eating plan to your individual calorie needs. What foods should I eat? Fruits All fresh, dried, or frozen fruit. Canned fruit in natural juice (without added sugar). Vegetables Fresh or frozen vegetables (raw, steamed, roasted, or grilled). Low-sodium or reduced-sodium tomato and vegetable  juice. Low-sodium or reduced-sodium tomato sauce and tomato paste. Low-sodium or reduced-sodium canned vegetables. Grains Whole-grain or whole-wheat bread. Whole-grain or whole-wheat pasta. Brown rice. [place]. Bulgur. Whole-grain and low-sodium cereals. Pita bread. Low-fat, low-sodium crackers. Whole-wheat flour tortillas. Meats and other proteins Skinless chicken or [place]. Ground chicken or [place]. Pork with fat trimmed off. Fish and seafood. Egg whites. Dried beans, peas, or lentils. Unsalted nuts, nut butters, and seeds. Unsalted canned beans. Lean cuts of beef with fat trimmed off. Low-sodium, lean precooked or cured meat, such as sausages or meat loaves. Dairy Low-fat (1%) or fat-free (skim) milk. Reduced-fat, low-fat, or fat-free cheeses. Nonfat, low-sodium ricotta or cottage cheese. Low-fat or nonfat yogurt. Low-fat, low-sodium cheese. Fats and oils Soft margarine without trans fats. Vegetable oil. Reduced-fat, low-fat, or light mayonnaise and salad dressings (reduced-sodium). Canola, safflower, olive, avocado, soybean, and sunflower oils. Avocado. Seasonings and  condiments Herbs. Spices. Seasoning mixes without salt. Other foods Unsalted popcorn and pretzels. Fat-free sweets. The items listed above may not be a complete list of foods and beverages you can eat. Contact a dietitian for more information. What foods should I avoid? Fruits Canned fruit in a light or heavy syrup. Fried fruit. Fruit in cream or butter sauce. Vegetables Creamed or fried vegetables. Vegetables in a cheese sauce. Regular canned vegetables (not low-sodium or reduced-sodium). Regular canned tomato sauce and paste (not low-sodium or reduced-sodium). Regular tomato and vegetable juice (not low-sodium or reduced-sodium). Angie Fava. Olives. Grains Baked goods made with fat, such as croissants, muffins, or some breads. Dry pasta or rice meal packs. Meats and other proteins Fatty cuts of meat. Ribs. Fried meat. Berniece Salines. Bologna, salami, and other precooked or cured meats, such as sausages or meat loaves. Fat from the back of a pig (fatback). Bratwurst. Salted nuts and seeds. Canned beans with added salt. Canned or smoked fish. Whole eggs or egg yolks. Chicken or [place] with skin. Dairy Whole or 2% milk, cream, and half-and-half. Whole or full-fat cream cheese. Whole-fat or sweetened yogurt. Full-fat cheese. Nondairy creamers. Whipped toppings. Processed cheese and cheese spreads. Fats and oils Butter. Stick margarine. Lard. Shortening. Ghee. Bacon fat. Tropical oils, such as coconut, palm kernel, or palm oil. Seasonings and condiments Onion salt, garlic salt, seasoned salt, table salt, and sea salt. Worcestershire sauce. Tartar sauce. Barbecue sauce. Teriyaki sauce. Soy sauce, including reduced-sodium. Steak sauce. Canned and packaged gravies. Fish sauce. Oyster sauce. Cocktail sauce. Store-bought horseradish. Ketchup. Mustard. Meat flavorings and tenderizers. Bouillon cubes. Hot sauces. Pre-made or packaged marinades. Pre-made or packaged taco seasonings. Relishes. Regular salad  dressings. Other foods Salted popcorn and pretzels. The items listed above may not be a complete list of foods and beverages you should avoid. Contact a dietitian for more information. Where to find more information  National Heart, Lung, and Blood Institute: https://wilson-eaton.com/  American Heart Association: www.heart.org  Academy of Nutrition and Dietetics: www.eatright.[place]: www.kidney.org Summary  The DASH eating plan is a healthy eating plan that has been shown to reduce high blood pressure (hypertension). It may also reduce your risk for type 2 diabetes, heart disease, and stroke.  When on the DASH eating plan, aim to eat more fresh fruits and vegetables, whole grains, lean proteins, low-fat dairy, and heart-healthy fats.  With the DASH eating plan, you should limit salt (sodium) intake to 2,300 mg a day. If you have hypertension, you may need to reduce your sodium intake to 1,500 mg a day.  Work with your health care provider or  dietitian to adjust your eating plan to your individual calorie needs. This information is not intended to replace advice given to you by your health care provider. Make sure you discuss any questions you have with your health care provider. Document Revised: 07/03/2019 Document Reviewed: 07/03/2019 Elsevier Patient Education  2021 [place]. Losartan Tablets What is this medicine? LOSARTAN (loe SAR tan) is an angiotensin II receptor blocker, also known as an ARB. It treats high blood pressure. It can slow kidney damage in some patients. It may also be used to lower the risk of stroke. This medicine may be used for other purposes; ask your health care provider or pharmacist if you have questions. COMMON BRAND NAME(S): Cozaar What should I tell my health care provider before I take this medicine? They need to know if you have any of these conditions:  heart failure  kidney disease  liver disease  an unusual or allergic  reaction to losartan, other medicines, foods, dyes, or preservatives  pregnant or trying to get pregnant  breast-feeding How should I use this medicine? Take this medicine by mouth. Take it as directed on the prescription label at the same time every day. You can take it with or without food. If it upsets your stomach, take it with food. Keep taking it unless your health care provider tells you to stop. Talk to your health care provider about the use of this medicine in children. While it may be prescribed for children as young as 6 for selected conditions, precautions do apply. Overdosage: If you think you have taken too much of this medicine contact a poison control center or emergency room at once. NOTE: This medicine is only for you. Do not share this medicine with others. What if I miss a dose? If you miss a dose, take it as soon as you can. If it is almost time for your next dose, take only that dose. Do not take double or extra doses. What may interact with this medicine?  aliskiren  ACE inhibitors, like enalapril or lisinopril  diuretics, especially amiloride, eplerenone, spironolactone, or triamterene  lithium  NSAIDs, medicines for pain and inflammation, like ibuprofen or naproxen  potassium salts or potassium supplements This list may not describe all possible interactions. Give your health care provider a list of all the medicines, herbs, non-prescription drugs, or dietary supplements you use. Also tell them if you smoke, drink alcohol, or use illegal drugs. Some items may interact with your medicine. What should I watch for while using this medicine? Visit your health care provider for regular check ups. Check your blood pressure as directed. Ask your health care provider what your blood pressure should be. Also, find out when you should contact him or her. Do not treat yourself for coughs, colds, or pain while you are using this medicine without asking your health care  provider for advice. Some medicines may increase your blood pressure. Women should inform their health care provider if they wish to become pregnant or think they might be pregnant. There is a potential for serious side effects to an unborn child. Talk to your health care provider for more information. You may get drowsy or dizzy. Do not drive, use machinery, or do anything that needs mental alertness until you know how this medicine affects you. Do not stand or sit up quickly, especially if you are an older patient. This reduces the risk of dizzy or fainting spells. Alcohol can make you more drowsy and dizzy. Avoid alcoholic  drinks. Avoid salt substitutes unless you are told otherwise by your health care provider. What side effects may I notice from receiving this medicine? Side effects that you should report to your doctor or health care professional as soon as possible:  allergic reactions (skin rash, itching or hives, swelling of the hands, feet, face, lips, throat, or tongue)  breathing problems  high potassium levels (chest pain; or fast, irregular heartbeat; muscle weakness)  kidney injury (trouble passing urine or change in the amount of urine)  low blood pressure (dizziness; feeling faint or lightheaded, falls; unusually weak or tired) Side effects that usually do not require medical attention (report to your doctor or health care professional if they continue or are bothersome):  cough  headache  nasal congestion or stuffiness  nausea or stomach pain This list may not describe all possible side effects. Call your doctor for medical advice about side effects. You may report side effects to FDA at 1-800-FDA-1088. Where should I keep my medicine? Keep out of the reach of children and pets. Store at room temperature between 20 and 25 degrees C (68 and 77 degrees F). Protect from light. Keep the container tightly closed. Get rid of any unused medicine after the expiration date. To  get rid of medicines that are no longer needed or have expired:  Take the medicine to a medicine take-back program. Check with your pharmacy or law enforcement to find a location.  If you cannot return the medicine, check the label or package insert to see if the medicine should be thrown out in the garbage or flushed down the toilet. If you are not sure, ask your health care provider. If it is safe to put in the trash, empty the medicine out of the container. Mix the medicine with cat litter, dirt, coffee grounds, or other unwanted substance. Seal the mixture in a bag or container. Put it in the trash. NOTE: This sheet is a summary. It may not cover all possible information. If you have questions about this medicine, talk to your doctor, pharmacist, or health care provider.  2021 Elsevier/Gold Standard (2019-10-08 16:16:09)  Food Choices for Gastroesophageal Reflux Disease, Adult When you have gastroesophageal reflux disease (GERD), the foods you eat and your eating habits are very important. Choosing the right foods can help ease your discomfort. Think about working with a food expert (dietitian) to help you make good choices. What are tips for following this plan? Reading food labels  Look for foods that are low in saturated fat. Foods that may help with your symptoms include: ? Foods that have less than 5% of daily value (DV) of fat. ? Foods that have 0 grams of trans fat. Cooking  Do not fry your food.  Cook your food by baking, steaming, grilling, or broiling. These are all methods that do not need a lot of fat for cooking.  To add flavor, try to use herbs that are low in spice and acidity. Meal planning  Choose healthy foods that are low in fat, such as: ? Fruits and vegetables. ? Whole grains. ? Low-fat dairy products. ? Lean meats, fish, and poultry.  Eat small meals often instead of eating 3 large meals each day. Eat your meals slowly in a place where you are relaxed. Avoid  bending over or lying down until 2-3 hours after eating.  Limit high-fat foods such as fatty meats or fried foods.  Limit your intake of fatty foods, such as oils, butter, and shortening.  Avoid the following as told by your doctor: ? Foods that cause symptoms. These may be different for different people. Keep a food diary to keep track of foods that cause symptoms. ? Alcohol. ? Drinking a lot of liquid with meals. ? Eating meals during the 2-3 hours before bed.   Lifestyle  Stay at a healthy weight. Ask your doctor what weight is healthy for you. If you need to lose weight, work with your doctor to do so safely.  Exercise for at least 30 minutes on 5 or more days each week, or as told by your doctor.  Wear loose-fitting clothes.  Do not smoke or use any products that contain nicotine or tobacco. If you need help quitting, ask your doctor.  Sleep with the head of your bed higher than your feet. Use a wedge under the mattress or blocks under the bed frame to raise the head of the bed.  Chew sugar-free gum after meals. What foods should eat? Eat a healthy, well-balanced diet of fruits, vegetables, whole grains, low-fat dairy products, lean meats, fish, and poultry. Each person is different. Foods that may cause symptoms in one person may not cause any symptoms in another person. Work with your doctor to find foods that are safe for you. The items listed above may not be a complete list of what you can eat and drink. Contact a food expert for more options.   What foods should I avoid? Limiting some of these foods may help in managing the symptoms of GERD. Everyone is different. Talk with a food expert or your doctor to help you find the exact foods to avoid, if any. Fruits Any fruits prepared with added fat. Any fruits that cause symptoms. For some people, this may include citrus fruits, such as oranges, grapefruit, pineapple, and lemons. Vegetables Deep-fried vegetables. [place] fries.  Any vegetables prepared with added fat. Any vegetables that cause symptoms. For some people, this may include tomatoes and tomato products, chili peppers, onions and garlic, and horseradish. Grains Pastries or quick breads with added fat. Meats and other proteins High-fat meats, such as fatty beef or pork, hot dogs, ribs, ham, sausage, salami, and bacon. Fried meat or protein, including fried fish and fried chicken. Nuts and nut butters, in large amounts. Dairy Whole milk and chocolate milk. Sour cream. Cream. Ice cream. Cream cheese. Milkshakes. Fats and oils Butter. Margarine. Shortening. Ghee. Beverages Coffee and tea, with or without caffeine. Carbonated beverages. Sodas. Energy drinks. Fruit juice made with acidic fruits, such as orange or grapefruit. Tomato juice. Alcoholic drinks. Sweets and desserts Chocolate and cocoa. Donuts. Seasonings and condiments Pepper. Peppermint and spearmint. Added salt. Any condiments, herbs, or seasonings that cause symptoms. For some people, this may include curry, hot sauce, or vinegar-based salad dressings. The items listed above may not be a complete list of what you should not eat and drink. Contact a food expert for more options. Questions to ask your doctor Diet and lifestyle changes are often the first steps that are taken to manage symptoms of GERD. If diet and lifestyle changes do not help, talk with your doctor about taking medicines. Where to find more information  International Foundation for Gastrointestinal Disorders: aboutgerd.org Summary  When you have GERD, food and lifestyle choices are very important in easing your symptoms.  Eat small meals often instead of 3 large meals a day. Eat your meals slowly and in a place where you are relaxed.  Avoid bending over or lying down until  2-3 hours after eating.  Limit high-fat foods such as fatty meats or fried foods. This information is not intended to replace advice given to you by your  health care provider. Make sure you discuss any questions you have with your health care provider. Document Revised: 02/08/2020 Document Reviewed: 02/08/2020 Elsevier Patient Education  2021 [place]. Pantoprazole tablets What is this medicine? PANTOPRAZOLE (pan TOE pra zole) prevents the production of acid in the stomach. It is used to treat gastroesophageal reflux disease (GERD), inflammation of the esophagus, and Zollinger-Ellison syndrome. This medicine may be used for other purposes; ask your health care provider or pharmacist if you have questions. COMMON BRAND NAME(S): Protonix What should I tell my health care provider before I take this medicine? They need to know if you have any of these conditions:  liver disease  low levels of magnesium in the blood  lupus  an unusual or allergic reaction to omeprazole, lansoprazole, pantoprazole, rabeprazole, other medicines, foods, dyes, or preservatives  pregnant or trying to get pregnant  breast-feeding How should I use this medicine? Take this medicine by mouth. Swallow the tablets whole with a drink of water. Follow the directions on the prescription label. Do not crush, break, or chew. Take your medicine at regular intervals. Do not take your medicine more often than directed. Talk to your pediatrician regarding the use of this medicine in children. While this drug may be prescribed for children as young as 5 years for selected conditions, precautions do apply. Overdosage: If you think you have taken too much of this medicine contact a poison control center or emergency room at once. NOTE: This medicine is only for you. Do not share this medicine with others. What if I miss a dose? If you miss a dose, take it as soon as you can. If it is almost time for your next dose, take only that dose. Do not take double or extra doses. What may interact with this medicine? Do not take this medicine with any of the following  medications:  atazanavir  nelfinavir This medicine may also interact with the following medications:  ampicillin  delavirdine  erlotinib  iron salts  medicines for fungal infections like ketoconazole, itraconazole and voriconazole  methotrexate  mycophenolate mofetil  warfarin This list may not describe all possible interactions. Give your health care provider a list of all the medicines, herbs, non-prescription drugs, or dietary supplements you use. Also tell them if you smoke, drink alcohol, or use illegal drugs. Some items may interact with your medicine. What should I watch for while using this medicine? It can take several days before your stomach pain gets better. Check with your doctor or health care provider if your condition does not start to get better, or if it gets worse. This medicine may cause serious skin reactions. They can happen weeks to months after starting the medicine. Contact your health care provider right away if you notice fevers or flu-like symptoms with a rash. The rash may be red or purple and then turn into blisters or peeling of the skin. Or, you might notice a red rash with swelling of the face, lips or lymph nodes in your neck or under your arms. You may need blood work done while you are taking this medicine. This medicine may cause a decrease in vitamin B12. You should make sure that you get enough vitamin B12 while you are taking this medicine. Discuss the foods you eat and the vitamins you take with your  health care provider. What side effects may I notice from receiving this medicine? Side effects that you should report to your doctor or health care professional as soon as possible:   allergic reactions like skin rash, itching or hives, swelling of the face, lips, or tongue   bone, muscle or joint pain   breathing problems   chest pain or chest tightness   dark yellow or brown urine   dizziness   fast, irregular  heartbeat   feeling faint or lightheaded   fever or sore throat   muscle spasm   palpitations   rash on cheeks or arms that gets worse in the sun   redness, blistering, peeling or loosening of the skin, including inside the mouth   seizures  stomach polyps   tremors   unusual bleeding or bruising   unusually weak or tired   yellowing of the eyes or skin Side effects that usually do not require medical attention (report to your doctor or health care professional if they continue or are bothersome):   constipation   diarrhea   dry mouth   headache   nausea This list may not describe all possible side effects. Call your doctor for medical advice about side effects. You may report side effects to FDA at 1-800-FDA-1088. Where should I keep my medicine? Keep out of the reach of children. Store at room temperature between 15 and 30 degrees C (59 and 86 degrees F). Protect from light and moisture. Throw away any unused medicine after the expiration date. NOTE: This sheet is a summary. It may not cover all possible information. If you have questions about this medicine, talk to your doctor, pharmacist, or health care provider.  2021 Elsevier/Gold Standard (2018-11-07 13:18:32)

## 2020-09-26 NOTE — Progress Notes (Signed)
Subjective:  Patient ID: Crystal Oneal, female    DOB: [birth year]  Age: 70 y.o. MRN: [id number]  Chief Complaint  Patient presents with  . Hospitalization Follow-up    Follow up from chest pain and abnormal EKG    HPI Crystal Oneal is a 70 year old Caucasian female that presents for hospital f/u after ED visit to [facility] on 09/21/20 with CP. BP today in office 162/88, pulse 71 BPM. She denies CP, dyspnea, or headache. No acute distress noted.  Pt has been experiencing elevated BP and intermittent CP for approximately 3-weeks. She was evaluated in ED at [facility] on 09/16/20 for the same symptoms. She is scheduled to follow-up with Dr Bettina Gavia, cardiologist on 10/24/20. She was given prescriptions for Clonidine 0.1 mg for BP over 160/90 every 6 hours and NTG 0.4 mg Q5 minutes for CP. Pt has been monitoring BP at home. Readings have been elevated per BP log 170's/ 90's. BP decreased to 130's/70's after Clonidine. She has experience intermittent episodes of chest "tightness" at rest, radiating "achiness" to bilateral shoulders, and overall not feeling well. CP alleviated with NTG. BP today in office 162/88, pulse 71 BPM.  Crystal Oneal has a history of GERD. She was previously prescribed Protonix 40 mg daily. She is not currently taking anything for GERD. She has agreed to resume Protonix to assess if symptoms are alleviated.     Current Outpatient Medications on File Prior to Visit  Medication Sig Dispense Refill  . alendronate (FOSAMAX) 70 MG tablet Take 1 tablet (70 mg total) by mouth every Friday. Take with a full glass of water on an empty stomach. 12 tablet 2  . aspirin EC 81 MG tablet Take 81 mg by mouth daily.    Marland Kitchen b complex vitamins tablet Take 1 tablet by mouth daily.    . calcium citrate-vitamin D (CITRACAL+D) 315-200 MG-UNIT tablet Take 2 tablets by mouth 2 (two) times daily.    . cetirizine (ZYRTEC) 10 MG tablet Take 5 mg by mouth daily as needed for allergies.    . Cholecalciferol  (VITAMIN D) 2000 units tablet Take 2,000 Units by mouth daily.    . cloNIDine (CATAPRES) 0.1 MG tablet Take 0.1 mg by mouth every 6 (six) hours as needed. Bp reading over 160/90    . fluticasone (FLONASE) 50 MCG/ACT nasal spray Place 1 spray into both nostrils daily as needed for allergies or rhinitis.    . Ginger, Zingiber officinalis, (GINGER ROOT) 550 MG CAPS Take 550 mg by mouth daily.    Marland Kitchen losartan (COZAAR) 25 MG tablet Take 1 tablet (25 mg total) by mouth daily. 90 tablet 0  . nitroGLYCERIN (NITROSTAT) 0.4 MG SL tablet Place 1 tablet (0.4 mg total) under the tongue every 5 (five) minutes as needed for chest pain. 50 tablet 3  . Omega-3 Fatty Acids (FISH OIL PO) Take 1,280 mg by mouth daily.    . pantoprazole (PROTONIX) 40 MG tablet 40 mg.    . TURMERIC CURCUMIN PO Take 750 mg by mouth daily.    . vitamin C (ASCORBIC ACID) 500 MG tablet Take 500 mg by mouth daily.     No current facility-administered medications on file prior to visit.   Past Medical History:  Diagnosis Date  . Age-related osteoporosis without current pathological fracture   . Arthritis   . Cancer ([place]) 04/2020   SCC skin cancer removed from nose  . Cervical stenosis of spine    C11-12  . Dysphagia    Esophageal  dilation x2  . Essential (primary) hypertension   . GERD (gastroesophageal reflux disease)   . Hypertension   . Mixed hyperlipidemia   . PONV (postoperative nausea and vomiting)   . Spondylolisthesis of lumbar region    Past Surgical History:  Procedure Laterality Date  . ABDOMINAL HYSTERECTOMY    . ANTERIOR AND POSTERIOR REPAIR N/A 03/20/2016   Procedure: REPAIR CYSTOCELE AND RECTOCELE;  Surgeon: Bjorn Loser, MD;  Location: [facility];  Service: Urology;  Laterality: N/A;  . BACK SURGERY  2019   has hardware  . COLONOSCOPY    . ESOPHAGEAL DILATION  2005   x 2  . 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 VAGINAL HYSTERECTOMY Bilateral 03/20/2016   Procedure: TOTAL VAGINAL  HYSTERECTOMY WITH BILATERAL SALPINGO OOPHORECTOMY mccall coloplasty;  Surgeon: Servando Salina, MD;  Location: [facility];  Service: Gynecology;  Laterality: Bilateral;  . VAGINAL PROLAPSE REPAIR N/A 03/20/2016   Procedure: REPAIR VAULT PROLAPSE AND GRAFT;  Surgeon: Bjorn Loser, MD;  Location: [facility];  Service: Urology;  Laterality: N/A;    Family History  Problem Relation Age of Onset  . Uterine cancer Mother   . Breast cancer Mother   . Lung disease Father   . Breast cancer Sister   . Multiple myeloma Brother    Social History   Socioeconomic History  . Marital status: Married    Spouse name: Not on file  . Number of children: 2  . Years of education: Not on file  . Highest education level: Not on file  Occupational History  . Not on file  Tobacco Use  . Smoking status: Never Smoker  . Smokeless tobacco: Never Used  Vaping Use  . Vaping Use: Never used  Substance and Sexual Activity  . Alcohol use: Yes    Alcohol/week: 1.0 standard drink    Types: 1 Glasses of wine per week    Comment: rarely  . Drug use: Never  . Sexual activity: Not on file  Other Topics Concern  . Not on file  Social History Narrative  . Not on file   Social Determinants of Health   Financial Resource Strain: Low Risk   . Difficulty of Paying Living Expenses: Not hard at all  Food Insecurity: No Food Insecurity  . Worried About Charity fundraiser in the Last Year: Never true  . Ran Out of Food in the Last Year: Never true  Transportation Needs: No Transportation Needs  . Lack of Transportation (Medical): No  . Lack of Transportation (Non-Medical): No  Physical Activity: Sufficiently Active  . Days of Exercise per Week: 5 days  . Minutes of Exercise per Session: 30 min  Stress: No Stress Concern Present  . Feeling of Stress : Not at all  Social Connections: Socially Integrated  . Frequency of Communication with Friends and Family: More than three times a week  . Frequency of Social  Gatherings with Friends and Family: Twice a week  . Attends Religious Services: More than 4 times per year  . Active Member of Clubs or Organizations: Yes  . Attends Archivist Meetings: More than 4 times per year  . Marital Status: Married    Review of Systems  Constitutional: Negative for fatigue and fever.  HENT: Negative for congestion, ear pain, sinus pressure and sore throat.   Eyes: Negative for pain.  Respiratory: Positive for chest tightness. Negative for cough, shortness of breath and wheezing.   Cardiovascular:  Positive for chest pain. Negative for palpitations.  Gastrointestinal: Negative for abdominal pain, constipation, diarrhea, nausea and vomiting.  Genitourinary: Negative for dysuria and hematuria.  Musculoskeletal: Positive for arthralgias and myalgias. Negative for back pain and joint swelling.       Bilateral shoulder "achiness" and generalized not feeling well  Skin: Negative for rash.  Neurological: Negative for dizziness, weakness and headaches.  Psychiatric/Behavioral: Negative for dysphoric mood. The patient is not nervous/anxious.      Objective:  BP (!) 162/88 (BP Location: Left Arm, Patient Position: Sitting)   Pulse 78   Temp (!) 97.3 F (36.3 C) (Temporal)   Ht 5' 1"  (1.549 m)   Wt 116 lb (52.6 kg)   SpO2 95%   BMI 21.92 kg/m   BP/Weight 09/26/2020 0/01/2375 09/20/3149  Systolic BP 761 607 371  Diastolic BP 88 83 68  Wt. (Lbs) 116 117.95 118  BMI 21.92 22.29 22.3    Physical Exam Vitals reviewed.  Constitutional:      Appearance: Normal appearance. She is normal weight.  HENT:     Head: Normocephalic.     Nose: Nose normal.     Mouth/Throat:     Mouth: Mucous membranes are moist.  Neck:     Vascular: No carotid bruit.  Cardiovascular:     Rate and Rhythm: Normal rate and regular rhythm.     Pulses: Normal pulses.     Heart sounds: Normal heart sounds.  Pulmonary:     Effort: Pulmonary effort is normal.     Breath sounds:  Normal breath sounds.  Abdominal:     General: Bowel sounds are normal.     Palpations: Abdomen is soft.  Musculoskeletal:        General: Normal range of motion.     Cervical back: Normal range of motion.  Lymphadenopathy:     Cervical: No cervical adenopathy.  Skin:    General: Skin is warm and dry.     Capillary Refill: Capillary refill takes less than 2 seconds.  Neurological:     General: No focal deficit present.     Mental Status: She is alert and oriented to person, place, and time.  Psychiatric:        Mood and Affect: Mood normal.        Behavior: Behavior normal.        Thought Content: Thought content normal.        Judgment: Judgment normal.           Lab Results  Component Value Date   WBC 4.3 09/21/2020   HGB 13.1 09/21/2020   HCT 38.6 09/21/2020   PLT 216 09/21/2020   GLUCOSE 101 (H) 09/21/2020   CHOL 224 (H) 06/16/2020   TRIG 60 06/16/2020   HDL 75 06/16/2020   LDLCALC 139 (H) 06/16/2020   ALT 22 06/22/2020   AST 31 06/22/2020   NA 139 09/21/2020   K 3.9 09/21/2020   CL 105 09/21/2020   CREATININE 0.81 09/21/2020   BUN 9 09/21/2020   CO2 26 09/21/2020   INR 0.95 03/15/2016      Assessment & Plan:    1. Uncontrolled hypertension - losartan (COZAAR) 50 MG tablet; Take 1 tablet (50 mg total) by mouth daily.  Dispense: 90 tablet; Refill: 0 - Ambulatory referral to Cardiology  2. Angina pectoris ([place]) - Ambulatory referral to Cardiology  3. Gastroesophageal reflux disease without esophagitis - pantoprazole (PROTONIX) 40 MG tablet; Take 1 tablet (40 mg total) by mouth  daily.  Dispense: 90 tablet; Refill: 1     Increase Losartan to 50 mg daily Monitor BP at home, notify office of any problems Keep appt with cardiology Resume Protonix 40 mg daily Return in 1-week for BP check (nurse visit)     Follow-up: 1-week for BP check  An After Visit Summary was printed and given to the patient.  Rip Harbour, NP [place] [phone]

## 2020-10-03 ENCOUNTER — Other Ambulatory Visit: Payer: Self-pay

## 2020-10-03 ENCOUNTER — Ambulatory Visit: Payer: PPO

## 2020-10-03 VITALS — BP 146/78

## 2020-10-03 DIAGNOSIS — I10 Essential (primary) hypertension: Secondary | ICD-10-CM

## 2020-10-03 NOTE — Progress Notes (Signed)
BP:146/78  BP Log: Y/N? Yes, brought with; as follows.  2/15: AM 128/80 134/71  PM 172/96 174/93 2/16: AM 137/81 137/82  PM 163/94 149/88 142/85 2/17: AM 134/79 121/70  PM 161/88 151/84 2/18: AM 134/79 129/78  PM 136/75 125/74 2/19: AM 144/84 144/80  PM 162/88 148/89 Advised?  Pt is to keep log until next visit. Pt will take once in AM and PM.   Royce Macadamia, [place] 10/03/20 10:07 AM

## 2020-10-12 DIAGNOSIS — R1310 Dysphagia, unspecified: Secondary | ICD-10-CM | POA: Insufficient documentation

## 2020-10-12 DIAGNOSIS — M1990 Unspecified osteoarthritis, unspecified site: Secondary | ICD-10-CM | POA: Insufficient documentation

## 2020-10-12 DIAGNOSIS — M810 Age-related osteoporosis without current pathological fracture: Secondary | ICD-10-CM | POA: Insufficient documentation

## 2020-10-12 DIAGNOSIS — I10 Essential (primary) hypertension: Secondary | ICD-10-CM | POA: Insufficient documentation

## 2020-10-12 DIAGNOSIS — R112 Nausea with vomiting, unspecified: Secondary | ICD-10-CM | POA: Insufficient documentation

## 2020-10-12 DIAGNOSIS — K219 Gastro-esophageal reflux disease without esophagitis: Secondary | ICD-10-CM | POA: Insufficient documentation

## 2020-10-13 ENCOUNTER — Ambulatory Visit: Payer: PPO | Admitting: Cardiology

## 2020-10-13 ENCOUNTER — Ambulatory Visit (INDEPENDENT_AMBULATORY_CARE_PROVIDER_SITE_OTHER): Payer: PPO

## 2020-10-13 ENCOUNTER — Encounter: Payer: Self-pay | Admitting: Cardiology

## 2020-10-13 ENCOUNTER — Other Ambulatory Visit: Payer: Self-pay

## 2020-10-13 VITALS — BP 152/88 | HR 77 | Ht 61.0 in | Wt 114.8 lb

## 2020-10-13 DIAGNOSIS — R079 Chest pain, unspecified: Secondary | ICD-10-CM | POA: Diagnosis not present

## 2020-10-13 DIAGNOSIS — R002 Palpitations: Secondary | ICD-10-CM

## 2020-10-13 DIAGNOSIS — E782 Mixed hyperlipidemia: Secondary | ICD-10-CM | POA: Diagnosis not present

## 2020-10-13 DIAGNOSIS — I10 Essential (primary) hypertension: Secondary | ICD-10-CM

## 2020-10-13 DIAGNOSIS — E559 Vitamin D deficiency, unspecified: Secondary | ICD-10-CM | POA: Diagnosis not present

## 2020-10-13 DIAGNOSIS — R0602 Shortness of breath: Secondary | ICD-10-CM | POA: Diagnosis not present

## 2020-10-13 DIAGNOSIS — R9431 Abnormal electrocardiogram [ECG] [EKG]: Secondary | ICD-10-CM

## 2020-10-13 MED ORDER — HYDROCHLOROTHIAZIDE 12.5 MG PO CAPS: 12.5000 mg | ORAL_CAPSULE | Freq: Every day | ORAL | 3 refills | Status: DC

## 2020-10-13 NOTE — Patient Instructions (Addendum)
Medication Instructions:  Your physician has recommended you make the following change in your medication: STOP: Clonidine (Catapres)  START:  Hydrochlorothiazide 12.5 mg daily *If you need a refill on your cardiac medications before your next appointment, please call your pharmacy*   Lab Work: Your physician recommends that you return for lab work:  TODAY: TSH, Vitamin D 3-7 Days before CT: BMET, MAG Day of Echo: Lipids (please come fasting)  If you have labs (blood work) drawn today and your tests are completely normal, you will receive your results only by: Marland Kitchen MyChart Message (if you have MyChart) OR . A paper copy in the mail If you have any lab test that is abnormal or we need to change your treatment, we will call you to review the results.   Testing/Procedures: Your physician has requested that you have an echocardiogram. Echocardiography is a painless test that uses sound waves to create images of your heart. It provides your doctor with information about the size and shape of your heart and how well your heart's chambers and valves are working. This procedure takes approximately one hour. There are no restrictions for this procedure.  A zio monitor was ordered today. It will remain on for 7 days. You will then return monitor and event diary in provided box. It takes 1-2 weeks for report to be downloaded and returned to [place]. We will call you with the results. If monitor falls off or has orange flashing light, please call Zio for further instructions.   Your cardiac CT will be scheduled at:  [facility] [address] [place], [place] [address] [phone]   If scheduled at [facility], please arrive at the [facility] main entrance (entrance A) of [facility] 30 minutes prior to test start time. Proceed to the [facility] Radiology Department (first floor) to check-in and test prep.  Please follow these instructions carefully (unless otherwise  directed):  On the Night Before the Test: . Be sure to Drink plenty of water. . Do not consume any caffeinated/decaffeinated beverages or chocolate 12 hours prior to your test. . Do not take any antihistamines 12 hours prior to your test.  On the Day of the Test: . Drink plenty of water until 1 hour prior to the test. . Do not eat any food 4 hours prior to the test. . You may take your regular medications prior to the test.  . Take metoprolol (Lopressor) two hours prior to test. . FEMALES- please wear underwire-free bra if available       After the Test: . Drink plenty of water. . After receiving IV contrast, you may experience a mild flushed feeling. This is normal. . On occasion, you may experience a mild rash up to 24 hours after the test. This is not dangerous. If this occurs, you can take Benadryl 25 mg and increase your fluid intake. . If you experience trouble breathing, this can be serious. If it is severe call 911 IMMEDIATELY. If it is mild, please call our office.  Once we have confirmed authorization from your insurance company, we will call you to set up a date and time for your test. Based on how quickly your insurance processes prior authorizations requests, please allow up to 4 weeks to be contacted for scheduling your Cardiac CT appointment. Be advised that routine Cardiac CT appointments could be scheduled as many as 8 weeks after your provider has ordered it.  For non-scheduling related questions, please contact  the cardiac imaging nurse navigator should you have any questions/concerns: Marchia Bond, Cardiac Imaging Nurse Navigator Gordy Clement, Cardiac Imaging Nurse Navigator [facility] Direct Office Dial: [phone]   For scheduling needs, including cancellations and rescheduling, please call [place], [phone].     Follow-Up: At [facility], you and your health needs are our priority.  As part of our continuing mission to  provide you with exceptional heart care, we have created designated Provider Care Teams.  These Care Teams include your primary Cardiologist (physician) and Advanced Practice Providers (APPs -  Physician Assistants and Nurse Practitioners) who all work together to provide you with the care you need, when you need it.  We recommend signing up for the patient portal called "MyChart".  Sign up information is provided on this After Visit Summary.  MyChart is used to connect with patients for Virtual Visits (Telemedicine).  Patients are able to view lab/test results, encounter notes, upcoming appointments, etc.  Non-urgent messages can be sent to your provider as well.   To learn more about what you can do with MyChart, go to [email or web address].    Your next appointment:   3 month(s)  The format for your next appointment:   In Person  Provider:   Berniece Salines, DO   Other Instructions  Echocardiogram An echocardiogram is a test that uses sound waves (ultrasound) to produce images of the heart. Images from an echocardiogram can provide important information about:  Heart size and shape.  The size and thickness and movement of your heart's walls.  Heart muscle function and strength.  Heart valve function or if you have stenosis. Stenosis is when the heart valves are too narrow.  If blood is flowing backward through the heart valves (regurgitation).  A tumor or infectious growth around the heart valves.  Areas of heart muscle that are not working well because of poor blood flow or injury from a heart attack.  Aneurysm detection. An aneurysm is a weak or damaged part of an artery wall. The wall bulges out from the normal force of blood pumping through the body. Tell a health care provider about:  Any allergies you have.  All medicines you are taking, including vitamins, herbs, eye drops, creams, and over-the-counter medicines.  Any blood disorders you have.  Any surgeries you  have had.  Any medical conditions you have.  Whether you are pregnant or may be pregnant. What are the risks? Generally, this is a safe test. However, problems may occur, including an allergic reaction to dye (contrast) that may be used during the test. What happens before the test? No specific preparation is needed. You may eat and drink normally. What happens during the test?  You will take off your clothes from the waist up and put on a hospital gown.  Electrodes or electrocardiogram (ECG)patches may be placed on your chest. The electrodes or patches are then connected to a device that monitors your heart rate and rhythm.  You will lie down on a table for an ultrasound exam. A gel will be applied to your chest to help sound waves pass through your skin.  A handheld device, called a transducer, will be pressed against your chest and moved over your heart. The transducer produces sound waves that travel to your heart and bounce back (or "echo" back) to the transducer. These sound waves will be captured in real-time and changed into images of your heart that can be viewed on a video monitor. The  images will be recorded on a computer and reviewed by your health care provider.  You may be asked to change positions or hold your breath for a short time. This makes it easier to get different views or better views of your heart.  In some cases, you may receive contrast through an IV in one of your veins. This can improve the quality of the pictures from your heart. The procedure may vary among health care providers and hospitals.   What can I expect after the test? You may return to your normal, everyday life, including diet, activities, and medicines, unless your health care provider tells you not to do that. Follow these instructions at home:  It is up to you to get the results of your test. Ask your health care provider, or the department that is doing the test, when your results will be  ready.  Keep all follow-up visits. This is important. Summary  An echocardiogram is a test that uses sound waves (ultrasound) to produce images of the heart.  Images from an echocardiogram can provide important information about the size and shape of your heart, heart muscle function, heart valve function, and other possible heart problems.  You do not need to do anything to prepare before this test. You may eat and drink normally.  After the echocardiogram is completed, you may return to your normal, everyday life, unless your health care provider tells you not to do that. This information is not intended to replace advice given to you by your health care provider. Make sure you discuss any questions you have with your health care provider. Document Revised: 03/22/2020 Document Reviewed: 03/22/2020 Elsevier Patient Education  2021 [employer].

## 2020-10-13 NOTE — Progress Notes (Signed)
Cardiology Office Note:    Date:  10/13/2020   ID:  [name], DOB [birth year], MRN [id number]  PCP:  Rochel Brome, MD  Cardiologist:  Berniece Salines, DO  Electrophysiologist:  None   Referring MD: Rochel Brome, MD     History of Present Illness:    Crystal Oneal is a 70 y.o. female with a hx of hypertension, hyperlipidemia presents to be evaluated at the request of her primary care provider.    Of note based on chart reviewed the patient was seen at [facility] emergency department for chest pain on September 21, 2020 at which time troponin was negative x2.  Prior to her visit to [facility] health she has been seen in the ED at [facility] on September 16, 2020 for chest pain at which time she was hypertensive she was giving clonidine as needed for evaded blood pressure.  She recently saw her primary care provider on September 26, 2020 and was started on losartan 50 mg daily.  Past Medical History:  Diagnosis Date  . Age-related osteoporosis without current pathological fracture   . Arthritis   . Cancer ([place]) 04/2020   SCC skin cancer removed from nose  . Cervical stenosis of spine    C11-12  . Dysphagia    Esophageal dilation x2  . Essential (primary) hypertension   . GERD (gastroesophageal reflux disease)   . Hypertension   . Mixed hyperlipidemia   . PONV (postoperative nausea and vomiting)   . Spondylolisthesis of lumbar region     Past Surgical History:  Procedure Laterality Date  . ABDOMINAL HYSTERECTOMY    . ANTERIOR AND POSTERIOR REPAIR N/A 03/20/2016   Procedure: REPAIR CYSTOCELE AND RECTOCELE;  Surgeon: Bjorn Loser, MD;  Location: [facility] ORS;  Service: Urology;  Laterality: N/A;  . BACK SURGERY  2019   has hardware  . COLONOSCOPY    . ESOPHAGEAL DILATION  2005   x 2  . 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 VAGINAL HYSTERECTOMY Bilateral 03/20/2016   Procedure: TOTAL VAGINAL HYSTERECTOMY WITH BILATERAL SALPINGO OOPHORECTOMY mccall  coloplasty;  Surgeon: Servando Salina, MD;  Location: [facility] ORS;  Service: Gynecology;  Laterality: Bilateral;  . VAGINAL PROLAPSE REPAIR N/A 03/20/2016   Procedure: REPAIR VAULT PROLAPSE AND GRAFT;  Surgeon: Bjorn Loser, MD;  Location: [facility] OR[facility];  Service: Urology;  Laterality: N/A;    Current Medications: Current Meds  Medication Sig  . alendronate (FOSAMAX) 70 MG tablet Take 1 tablet (70 mg total) by mouth every Friday. Take with a full glass of water on an empty stomach.  Marland Kitchen aspirin EC 81 MG tablet Take 81 mg by mouth daily.  Marland Kitchen b complex vitamins tablet Take 1 tablet by mouth daily.  . calcium citrate-vitamin D (CITRACAL+D) 315-200 MG-UNIT tablet Take 2 tablets by mouth 2 (two) times daily.  . cetirizine (ZYRTEC) 10 MG tablet Take 5 mg by mouth daily as needed for allergies.  . Cholecalciferol (VITAMIN D) 2000 units tablet Take 2,000 Units by mouth daily.  . cloNIDine (CATAPRES) 0.1 MG tablet Take 0.1 mg by mouth every 6 (six) hours as needed. Bp reading over 160/90  . fluticasone (FLONASE) 50 MCG/ACT nasal spray Place 1 spray into both nostrils daily as needed for allergies or rhinitis.  . Ginger, Zingiber officinalis, (GINGER ROOT) 550 MG CAPS Take 550 mg by mouth daily.  Marland Kitchen losartan (COZAAR) 50 MG tablet Take 1 tablet (50 mg total) by mouth daily.  Marland Kitchen  nitroGLYCERIN (NITROSTAT) 0.4 MG SL tablet Place 1 tablet (0.4 mg total) under the tongue every 5 (five) minutes as needed for chest pain.  . Omega-3 Fatty Acids (FISH OIL PO) Take 1,280 mg by mouth daily.  . pantoprazole (PROTONIX) 40 MG tablet Take 1 tablet (40 mg total) by mouth daily.  . TURMERIC CURCUMIN PO Take 750 mg by mouth daily.  . vitamin C (ASCORBIC ACID) 500 MG tablet Take 500 mg by mouth daily.     Allergies:   Pneumovax 23 [pneumococcal vac polyvalent], Lisinopril, and Sulfa antibiotics   Social History   Socioeconomic History  . Marital status: Married    Spouse name: Not on file  . Number of children: 2  . Years of  education: Not on file  . Highest education level: Not on file  Occupational History  . Not on file  Tobacco Use  . Smoking status: Never Smoker  . Smokeless tobacco: Never Used  Vaping Use  . Vaping Use: Never used  Substance and Sexual Activity  . Alcohol use: Yes    Alcohol/week: 1.0 standard drink    Types: 1 Glasses of wine per week    Comment: rarely  . Drug use: Never  . Sexual activity: Not on file  Other Topics Concern  . Not on file  Social History Narrative  . Not on file   Social Determinants of Health   Financial Resource Strain: Low Risk   . Difficulty of Paying Living Expenses: Not hard at all  Food Insecurity: No Food Insecurity  . Worried About Charity fundraiser in the Last Year: Never true  . Ran Out of Food in the Last Year: Never true  Transportation Needs: No Transportation Needs  . Lack of Transportation (Medical): No  . Lack of Transportation (Non-Medical): No  Physical Activity: Sufficiently Active  . Days of Exercise per Week: 5 days  . Minutes of Exercise per Session: 30 min  Stress: No Stress Concern Present  . Feeling of Stress : Not at all  Social Connections: Socially Integrated  . Frequency of Communication with Friends and Family: More than three times a week  . Frequency of Social Gatherings with Friends and Family: Twice a week  . Attends Religious Services: More than 4 times per year  . Active Member of Clubs or Organizations: Yes  . Attends Archivist Meetings: More than 4 times per year  . Marital Status: Married     Family History: The patient's family history includes Breast cancer in her mother and sister; Lung disease in her father; Multiple myeloma in her brother; Uterine cancer in her mother.  ROS:   Review of Systems  Constitution: Negative for decreased appetite, fever and weight gain.  HENT: Negative for congestion, ear discharge, hoarse voice and sore throat.   Eyes: Negative for discharge, redness, vision  loss in right eye and visual halos.  Cardiovascular: Negative for chest pain, dyspnea on exertion, leg swelling, orthopnea and palpitations.  Respiratory: Negative for cough, hemoptysis, shortness of breath and snoring.   Endocrine: Negative for heat intolerance and polyphagia.  Hematologic/Lymphatic: Negative for bleeding problem. Does not bruise/bleed easily.  Skin: Negative for flushing, nail changes, rash and suspicious lesions.  Musculoskeletal: Negative for arthritis, joint pain, muscle cramps, myalgias, neck pain and stiffness.  Gastrointestinal: Negative for abdominal pain, bowel incontinence, diarrhea and excessive appetite.  Genitourinary: Negative for decreased libido, genital sores and incomplete emptying.  Neurological: Negative for brief paralysis, focal weakness, headaches and  loss of balance.  Psychiatric/Behavioral: Negative for altered mental status, depression and suicidal ideas.  Allergic/Immunologic: Negative for HIV exposure and persistent infections.    EKGs/Labs/Other Studies Reviewed:    The following studies were reviewed today:   EKG:  The ekg ordered today demonstrates   Recent Labs: 06/22/2020: ALT 22 09/21/2020: BUN 9; Creatinine, Ser 0.81; Hemoglobin 13.1; Platelets 216; Potassium 3.9; Sodium 139  Recent Lipid Panel    Component Value Date/Time   CHOL 224 (H) 06/16/2020 0837   TRIG 60 06/16/2020 0837   HDL 75 06/16/2020 0837   CHOLHDL 3.0 06/16/2020 0837   LDLCALC 139 (H) 06/16/2020 0837    Physical Exam:    VS:  BP (!) 152/88 (BP Location: Right Arm)   Ht 5' 1"  (1.549 m)   Wt 114 lb 12.8 oz (52.1 kg)   SpO2 98%   BMI 21.69 kg/m     Wt Readings from Last 3 Encounters:  10/13/20 114 lb 12.8 oz (52.1 kg)  09/26/20 116 lb (52.6 kg)  09/21/20 117 lb 15.1 oz (53.5 kg)     GEN: Well nourished, well developed in no acute distress HEENT: Normal NECK: No JVD; No carotid bruits LYMPHATICS: No lymphadenopathy CARDIAC: S1S2 noted,RRR, no murmurs,  rubs, gallops RESPIRATORY:  Clear to auscultation without rales, wheezing or rhonchi  ABDOMEN: Soft, non-tender, non-distended, +bowel sounds, no guarding. EXTREMITIES: No edema, No cyanosis, no clubbing MUSCULOSKELETAL:  No deformity  SKIN: Warm and dry NEUROLOGIC:  Alert and oriented x 3, non-focal PSYCHIATRIC:  Normal affect, good insight  ASSESSMENT:    No diagnosis found. PLAN:     1.  The patient is in agreement with the above plan. The patient left the office in stable condition.  The patient will follow up in   Medication Adjustments/Labs and Tests Ordered: Current medicines are reviewed at length with the patient today.  Concerns regarding medicines are outlined above.  No orders of the defined types were placed in this encounter.  No orders of the defined types were placed in this encounter.   There are no Patient Instructions on file for this visit.   Adopting a Healthy Lifestyle.  Know what a healthy weight is for you (roughly BMI <25) and aim to maintain this   Aim for 7+ servings of fruits and vegetables daily   65-80+ fluid ounces of water or unsweet tea for healthy kidneys   Limit to max 1 drink of alcohol per day; avoid smoking/tobacco   Limit animal fats in diet for cholesterol and heart health - choose grass fed whenever available   Avoid highly processed foods, and foods high in saturated/trans fats   Aim for low stress - take time to unwind and care for your mental health   Aim for 150 min of moderate intensity exercise weekly for heart health, and weights twice weekly for bone health   Aim for 7-9 hours of sleep daily   When it comes to diets, agreement about the perfect plan isnt easy to find, even among the experts. Experts at the [employer] developed an idea known as the Healthy Eating Plate. Just imagine a plate divided into logical, healthy portions.   The emphasis is on diet quality:   Load up on vegetables and  fruits - one-half of your plate: Aim for color and variety, and remember that potatoes dont count.   Go for whole grains - one-quarter of your plate: Whole wheat, barley, wheat berries, quinoa, oats, brown rice,  and foods made with them. If you want pasta, go with whole wheat pasta.   Protein power - one-quarter of your plate: Fish, chicken, beans, and nuts are all healthy, versatile protein sources. Limit red meat.   The diet, however, does go beyond the plate, offering a few other suggestions.   Use healthy plant oils, such as olive, canola, soy, corn, sunflower and peanut. Check the labels, and avoid partially hydrogenated oil, which have unhealthy trans fats.   If youre thirsty, drink water. Coffee and tea are good in moderation, but skip sugary drinks and limit milk and dairy products to one or two daily servings.   The type of carbohydrate in the diet is more important than the amount. Some sources of carbohydrates, such as vegetables, fruits, whole grains, and beans-are healthier than others.   Finally, stay active  Signed, Berniece Salines, DO  10/13/2020 10:45 AM    [place]

## 2020-10-13 NOTE — Progress Notes (Signed)
Cardiology Office Note:    Date:  10/13/2020   ID:  Crystal Oneal, DOB 03-14-1951, MRN [id number]  PCP:  Rochel Brome, MD  Cardiologist:  Berniece Salines, DO  Electrophysiologist:  None   Referring MD: Rochel Brome, MD   I am having intermittent chest pain  History of Present Illness:    Crystal Oneal is a 70 y.o. female with a hx of hypertension, hyperlipidemia presents to be evaluated at the request of her primary care provider.    The patient tells me her last several months she has had intermittent chest pain.  She described it as a midsternal chest discomfort  Of note based on chart reviewed the patient was seen at [facility] health emergency department for chest pain on September 21, 2020 at which time troponin was negative x2.  Prior to her visit to [facility] health she has been seen in the ED at [facility] on September 16, 2020 for chest pain at which time she was hypertensive she was giving clonidine as needed for evaded blood pressure.  She recently saw her primary care provider on September 26, 2020 and was started on losartan 50 mg daily.  Past Medical History:  Diagnosis Date  . Age-related osteoporosis without current pathological fracture   . Arthritis   . Cancer ([place]) 04/2020   SCC skin cancer removed from nose  . Cervical stenosis of spine    C11-12  . Dysphagia    Esophageal dilation x2  . Essential (primary) hypertension   . GERD (gastroesophageal reflux disease)   . Hypertension   . Mixed hyperlipidemia   . PONV (postoperative nausea and vomiting)   . Spondylolisthesis of lumbar region     Past Surgical History:  Procedure Laterality Date  . ABDOMINAL HYSTERECTOMY    . ANTERIOR AND POSTERIOR REPAIR N/A 03/20/2016   Procedure: REPAIR CYSTOCELE AND RECTOCELE;  Surgeon: Bjorn Loser, MD;  Location: [facility] ORS;  Service: Urology;  Laterality: N/A;  . BACK SURGERY  2019   has hardware  . COLONOSCOPY    . ESOPHAGEAL DILATION  2005   x 2  . 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 VAGINAL HYSTERECTOMY Bilateral 03/20/2016   Procedure: TOTAL VAGINAL HYSTERECTOMY WITH BILATERAL SALPINGO OOPHORECTOMY mccall coloplasty;  Surgeon: Servando Salina, MD;  Location: [facility];  Service: Gynecology;  Laterality: Bilateral;  . VAGINAL PROLAPSE REPAIR N/A 03/20/2016   Procedure: REPAIR VAULT PROLAPSE AND GRAFT;  Surgeon: Bjorn Loser, MD;  Location: [facility];  Service: Urology;  Laterality: N/A;    Current Medications: Current Meds  Medication Sig  . alendronate (FOSAMAX) 70 MG tablet Take 1 tablet (70 mg total) by mouth every Friday. Take with a full glass of water on an empty stomach.  Marland Kitchen aspirin EC 81 MG tablet Take 81 mg by mouth daily.  Marland Kitchen b complex vitamins tablet Take 1 tablet by mouth daily.  . calcium citrate-vitamin D (CITRACAL+D) 315-200 MG-UNIT tablet Take 2 tablets by mouth 2 (two) times daily.  . cetirizine (ZYRTEC) 10 MG tablet Take 5 mg by mouth daily as needed for allergies.  . Cholecalciferol (VITAMIN D) 2000 units tablet Take 2,000 Units by mouth daily.  . fluticasone (FLONASE) 50 MCG/ACT nasal spray Place 1 spray into both nostrils daily as needed for allergies or rhinitis.  . Ginger, Zingiber officinalis, (GINGER ROOT) 550 MG CAPS Take 550 mg by mouth daily.  . hydrochlorothiazide (MICROZIDE) 12.5 MG capsule Take 1 capsule (12.5  mg total) by mouth daily.  Marland Kitchen losartan (COZAAR) 50 MG tablet Take 1 tablet (50 mg total) by mouth daily.  . nitroGLYCERIN (NITROSTAT) 0.4 MG SL tablet Place 1 tablet (0.4 mg total) under the tongue every 5 (five) minutes as needed for chest pain.  . Omega-3 Fatty Acids (FISH OIL PO) Take 1,280 mg by mouth daily.  . pantoprazole (PROTONIX) 40 MG tablet Take 1 tablet (40 mg total) by mouth daily.  . TURMERIC CURCUMIN PO Take 750 mg by mouth daily.  . vitamin C (ASCORBIC ACID) 500 MG tablet Take 500 mg by mouth daily.  . [DISCONTINUED] cloNIDine (CATAPRES) 0.1 MG tablet Take 0.1 mg by mouth every 6  (six) hours as needed. Bp reading over 160/90     Allergies:   Pneumovax 23 [pneumococcal vac polyvalent], Lisinopril, and Sulfa antibiotics   Social History   Socioeconomic History  . Marital status: Married    Spouse name: Not on file  . Number of children: 2  . Years of education: Not on file  . Highest education level: Not on file  Occupational History  . Not on file  Tobacco Use  . Smoking status: Never Smoker  . Smokeless tobacco: Never Used  Vaping Use  . Vaping Use: Never used  Substance and Sexual Activity  . Alcohol use: Yes    Alcohol/week: 1.0 standard drink    Types: 1 Glasses of wine per week    Comment: rarely  . Drug use: Never  . Sexual activity: Not on file  Other Topics Concern  . Not on file  Social History Narrative  . Not on file   Social Determinants of Health   Financial Resource Strain: Low Risk   . Difficulty of Paying Living Expenses: Not hard at all  Food Insecurity: No Food Insecurity  . Worried About Charity fundraiser in the Last Year: Never true  . Ran Out of Food in the Last Year: Never true  Transportation Needs: No Transportation Needs  . Lack of Transportation (Medical): No  . Lack of Transportation (Non-Medical): No  Physical Activity: Sufficiently Active  . Days of Exercise per Week: 5 days  . Minutes of Exercise per Session: 30 min  Stress: No Stress Concern Present  . Feeling of Stress : Not at all  Social Connections: Socially Integrated  . Frequency of Communication with Friends and Family: More than three times a week  . Frequency of Social Gatherings with Friends and Family: Twice a week  . Attends Religious Services: More than 4 times per year  . Active Member of Clubs or Organizations: Yes  . Attends Archivist Meetings: More than 4 times per year  . Marital Status: Married     Family History: The patient's family history includes Breast cancer in her mother and sister; Lung disease in her father;  Multiple myeloma in her brother; Uterine cancer in her mother.  ROS:   Review of Systems  Constitution: Negative for decreased appetite, fever and weight gain.  HENT: Negative for congestion, ear discharge, hoarse voice and sore throat.   Eyes: Negative for discharge, redness, vision loss in right eye and visual halos.  Cardiovascular: Negative for chest pain, dyspnea on exertion, leg swelling, orthopnea and palpitations.  Respiratory: Negative for cough, hemoptysis, shortness of breath and snoring.   Endocrine: Negative for heat intolerance and polyphagia.  Hematologic/Lymphatic: Negative for bleeding problem. Does not bruise/bleed easily.  Skin: Negative for flushing, nail changes, rash and suspicious lesions.  Musculoskeletal: Negative for arthritis, joint pain, muscle cramps, myalgias, neck pain and stiffness.  Gastrointestinal: Negative for abdominal pain, bowel incontinence, diarrhea and excessive appetite.  Genitourinary: Negative for decreased libido, genital sores and incomplete emptying.  Neurological: Negative for brief paralysis, focal weakness, headaches and loss of balance.  Psychiatric/Behavioral: Negative for altered mental status, depression and suicidal ideas.  Allergic/Immunologic: Negative for HIV exposure and persistent infections.    EKGs/Labs/Other Studies Reviewed:    The following studies were reviewed today:   EKG:  The ekg ordered today demonstrates sinus rhythm, heart rate 75 bpm with left atrial enlargement and septal infarction  Recent Labs: 06/22/2020: ALT 22 09/21/2020: BUN 9; Creatinine, Ser 0.81; Hemoglobin 13.1; Platelets 216; Potassium 3.9; Sodium 139  Recent Lipid Panel    Component Value Date/Time   CHOL 224 (H) 06/16/2020 0837   TRIG 60 06/16/2020 0837   HDL 75 06/16/2020 0837   CHOLHDL 3.0 06/16/2020 0837   LDLCALC 139 (H) 06/16/2020 0837    Physical Exam:    VS:  BP (!) 152/88 (BP Location: Right Arm)   Pulse 77   Ht _0  (1.549 m)    Wt 114 lb 12.8 oz (52.1 kg)   SpO2 98%   BMI 21.69 kg/m     Wt Readings from Last 3 Encounters:  10/13/20 114 lb 12.8 oz (52.1 kg)  09/26/20 116 lb (52.6 kg)  09/21/20 117 lb 15.1 oz (53.5 kg)     GEN: Well nourished, well developed in no acute distress HEENT: Normal NECK: No JVD; No carotid bruits LYMPHATICS: No lymphadenopathy CARDIAC: S1S2 noted,RRR, no murmurs, rubs, gallops RESPIRATORY:  Clear to auscultation without rales, wheezing or rhonchi  ABDOMEN: Soft, non-tender, non-distended, +bowel sounds, no guarding. EXTREMITIES: No edema, No cyanosis, no clubbing MUSCULOSKELETAL:  No deformity  SKIN: Warm and dry NEUROLOGIC:  Alert and oriented x 3, non-focal PSYCHIATRIC:  Normal affect, good insight  ASSESSMENT:    1. Chest pain of uncertain etiology   2. Essential (primary) hypertension   3. Mixed hyperlipidemia   4. Vitamin D deficiency   5. Palpitations   6. SOB (shortness of breath)   7. Chest pain, unspecified type   8. Nonspecific abnormal electrocardiogram (ECG) (EKG)    PLAN:     1.  His chest pain is concerning patient does have intermediate risk for coronary artery disease would not like to do is pursue an ischemic evaluation in this patient.  Shared decision a coronary CTA at this time is appropriate.  I have discussed with the patient about the testing.  The patient has no IV contrast allergy and is agreeable to proceed with this test.  I would like to rule out a cardiovascular etiology of this palpitation, therefore at this time I would like to placed a zio patch for   7 days. In additon a transthoracic echocardiogram will be ordered to assess LV/RV function and any structural abnormalities. Once these testing have been performed amd reviewed further reccomendations will be made. For now, I do reccomend that the patient goes to the nearest ED if  symptoms recur.  She does have vitamin D deficiency we will going to go ahead and get vitamin D levels  today. TSH will be done for palpitations as well.  I am stopping the clonidine and placing the patient on chlorothiazide 12.5 mg for her hypertension. In terms of her hyperlipidemia she wishes to do diet modification.  The patient is in agreement with the above plan. The patient left  the office in stable condition.  The patient will follow up in 3 months or sooner if needed.   Medication Adjustments/Labs and Tests Ordered: Current medicines are reviewed at length with the patient today.  Concerns regarding medicines are outlined above.  Orders Placed This Encounter  Procedures  . CT CORONARY MORPH W/CTA COR W/SCORE W/CA W/CM &/OR WO/CM  . CT CORONARY FRACTIONAL FLOW RESERVE DATA PREP  . CT CORONARY FRACTIONAL FLOW RESERVE FLUID ANALYSIS  . TSH  . VITAMIN D 25 Hydroxy (Vit-D Deficiency, Fractures)  . Lipid panel  . Basic metabolic panel  . Magnesium  . LONG TERM MONITOR (3-14 DAYS)  . EKG 12-Lead  . ECHOCARDIOGRAM COMPLETE   Meds ordered this encounter  Medications  . hydrochlorothiazide (MICROZIDE) 12.5 MG capsule    Sig: Take 1 capsule (12.5 mg total) by mouth daily.    Dispense:  90 capsule    Refill:  3    Patient Instructions   Medication Instructions:  Your physician has recommended you make the following change in your medication: STOP: Clonidine (Catapres)  START:  Hydrochlorothiazide 12.5 mg daily *If you need a refill on your cardiac medications before your next appointment, please call your pharmacy*   Lab Work: Your physician recommends that you return for lab work:  TODAY: TSH, Vitamin D 3-7 Days before CT: BMET, MAG Day of Echo: Lipids (please come fasting)  If you have labs (blood work) drawn today and your tests are completely normal, you will receive your results only by: Marland Kitchen MyChart Message (if you have MyChart) OR . A paper copy in the mail If you have any lab test that is abnormal or we need to change your treatment, we will call you to review the  results.   Testing/Procedures: Your physician has requested that you have an echocardiogram. Echocardiography is a painless test that uses sound waves to create images of your heart. It provides your doctor with information about the size and shape of your heart and how well your heart's chambers and valves are working. This procedure takes approximately one hour. There are no restrictions for this procedure.  A zio monitor was ordered today. It will remain on for 7 days. You will then return monitor and event diary in provided box. It takes 1-2 weeks for report to be downloaded and returned to [place]. We will call you with the results. If monitor falls off or has orange flashing light, please call Zio for further instructions.   Your cardiac CT will be scheduled at:  [facility] [address] [place], [place] [address] [phone]   If scheduled at [facility], please arrive at the [facility] main entrance (entrance A) of [facility] 30 minutes prior to test start time. Proceed to the [facility] Radiology Department (first floor) to check-in and test prep.  Please follow these instructions carefully (unless otherwise directed):  On the Night Before the Test: . Be sure to Drink plenty of water. . Do not consume any caffeinated/decaffeinated beverages or chocolate 12 hours prior to your test. . Do not take any antihistamines 12 hours prior to your test.  On the Day of the Test: . Drink plenty of water until 1 hour prior to the test. . Do not eat any food 4 hours prior to the test. . You may take your regular medications prior to the test.  . Take metoprolol (Lopressor) two hours prior to test. . FEMALES- please wear underwire-free bra if  available       After the Test: . Drink plenty of water. . After receiving IV contrast, you may experience a mild flushed feeling. This is normal. . On occasion, you may experience a mild rash up to 24 hours after the  test. This is not dangerous. If this occurs, you can take Benadryl 25 mg and increase your fluid intake. . If you experience trouble breathing, this can be serious. If it is severe call 911 IMMEDIATELY. If it is mild, please call our office.  Once we have confirmed authorization from your insurance company, we will call you to set up a date and time for your test. Based on how quickly your insurance processes prior authorizations requests, please allow up to 4 weeks to be contacted for scheduling your Cardiac CT appointment. Be advised that routine Cardiac CT appointments could be scheduled as many as 8 weeks after your provider has ordered it.  For non-scheduling related questions, please contact the cardiac imaging nurse navigator should you have any questions/concerns: Marchia Bond, Cardiac Imaging Nurse Navigator Gordy Clement, Cardiac Imaging Nurse Navigator [facility] Direct Office Dial: [phone]   For scheduling needs, including cancellations and rescheduling, please call [place], [phone].     Follow-Up: At [facility], you and your health needs are our priority.  As part of our continuing mission to provide you with exceptional heart care, we have created designated Provider Care Teams.  These Care Teams include your primary Cardiologist (physician) and Advanced Practice Providers (APPs -  Physician Assistants and Nurse Practitioners) who all work together to provide you with the care you need, when you need it.  We recommend signing up for the patient portal called "MyChart".  Sign up information is provided on this After Visit Summary.  MyChart is used to connect with patients for Virtual Visits (Telemedicine).  Patients are able to view lab/test results, encounter notes, upcoming appointments, etc.  Non-urgent messages can be sent to your provider as well.   To learn more about what you can do with MyChart, go to [email or web address].     Your next appointment:   3 month(s)  The format for your next appointment:   In Person  Provider:   Berniece Salines, DO   Other Instructions  Echocardiogram An echocardiogram is a test that uses sound waves (ultrasound) to produce images of the heart. Images from an echocardiogram can provide important information about:  Heart size and shape.  The size and thickness and movement of your heart's walls.  Heart muscle function and strength.  Heart valve function or if you have stenosis. Stenosis is when the heart valves are too narrow.  If blood is flowing backward through the heart valves (regurgitation).  A tumor or infectious growth around the heart valves.  Areas of heart muscle that are not working well because of poor blood flow or injury from a heart attack.  Aneurysm detection. An aneurysm is a weak or damaged part of an artery wall. The wall bulges out from the normal force of blood pumping through the body. Tell a health care provider about:  Any allergies you have.  All medicines you are taking, including vitamins, herbs, eye drops, creams, and over-the-counter medicines.  Any blood disorders you have.  Any surgeries you have had.  Any medical conditions you have.  Whether you are pregnant or may be pregnant. What are the risks? Generally, this is a safe test. However, problems may occur, including an allergic  reaction to dye (contrast) that may be used during the test. What happens before the test? No specific preparation is needed. You may eat and drink normally. What happens during the test?  You will take off your clothes from the waist up and put on a hospital gown.  Electrodes or electrocardiogram (ECG)patches may be placed on your chest. The electrodes or patches are then connected to a device that monitors your heart rate and rhythm.  You will lie down on a table for an ultrasound exam. A gel will be applied to your chest to help sound waves pass  through your skin.  A handheld device, called a transducer, will be pressed against your chest and moved over your heart. The transducer produces sound waves that travel to your heart and bounce back (or "echo" back) to the transducer. These sound waves will be captured in real-time and changed into images of your heart that can be viewed on a video monitor. The images will be recorded on a computer and reviewed by your health care provider.  You may be asked to change positions or hold your breath for a short time. This makes it easier to get different views or better views of your heart.  In some cases, you may receive contrast through an IV in one of your veins. This can improve the quality of the pictures from your heart. The procedure may vary among health care providers and hospitals.   What can I expect after the test? You may return to your normal, everyday life, including diet, activities, and medicines, unless your health care provider tells you not to do that. Follow these instructions at home:  It is up to you to get the results of your test. Ask your health care provider, or the department that is doing the test, when your results will be ready.  Keep all follow-up visits. This is important. Summary  An echocardiogram is a test that uses sound waves (ultrasound) to produce images of the heart.  Images from an echocardiogram can provide important information about the size and shape of your heart, heart muscle function, heart valve function, and other possible heart problems.  You do not need to do anything to prepare before this test. You may eat and drink normally.  After the echocardiogram is completed, you may return to your normal, everyday life, unless your health care provider tells you not to do that. This information is not intended to replace advice given to you by your health care provider. Make sure you discuss any questions you have with your health care  provider. Document Revised: 03/22/2020 Document Reviewed: 03/22/2020 Elsevier Patient Education  2021 [place].     Adopting a Healthy Lifestyle.  Know what a healthy weight is for you (roughly BMI <25) and aim to maintain this   Aim for 7+ servings of fruits and vegetables daily   65-80+ fluid ounces of water or unsweet tea for healthy kidneys   Limit to max 1 drink of alcohol per day; avoid smoking/tobacco   Limit animal fats in diet for cholesterol and heart health - choose grass fed whenever available   Avoid highly processed foods, and foods high in saturated/trans fats   Aim for low stress - take time to unwind and care for your mental health   Aim for 150 min of moderate intensity exercise weekly for heart health, and weights twice weekly for bone health   Aim for 7-9 hours of sleep daily  When it comes to diets, agreement about the perfect plan isnt easy to find, even among the experts. Experts at the [employer] developed an idea known as the Healthy Eating Plate. Just imagine a plate divided into logical, healthy portions.   The emphasis is on diet quality:   Load up on vegetables and fruits - one-half of your plate: Aim for color and variety, and remember that potatoes dont count.   Go for whole grains - one-quarter of your plate: Whole wheat, barley, wheat berries, quinoa, oats, brown rice, and foods made with them. If you want pasta, go with whole wheat pasta.   Protein power - one-quarter of your plate: Fish, chicken, beans, and nuts are all healthy, versatile protein sources. Limit red meat.   The diet, however, does go beyond the plate, offering a few other suggestions.   Use healthy plant oils, such as olive, canola, soy, corn, sunflower and peanut. Check the labels, and avoid partially hydrogenated oil, which have unhealthy trans fats.   If youre thirsty, drink water. Coffee and tea are good in moderation, but skip sugary drinks  and limit milk and dairy products to one or two daily servings.   The type of carbohydrate in the diet is more important than the amount. Some sources of carbohydrates, such as vegetables, fruits, whole grains, and beans-are healthier than others.   Finally, stay active  Signed, Berniece Salines, DO  10/13/2020 12:21 PM    [facility]

## 2020-10-14 LAB — TSH: TSH: 2.74 u[IU]/mL (ref 0.450–4.500)

## 2020-10-14 LAB — VITAMIN D 25 HYDROXY (VIT D DEFICIENCY, FRACTURES): Vit D, 25-Hydroxy: 69.5 ng/mL (ref 30.0–100.0)

## 2020-10-17 ENCOUNTER — Telehealth: Payer: Self-pay | Admitting: Cardiology

## 2020-10-17 MED ORDER — METOPROLOL TARTRATE 100 MG PO TABS: 100.0000 mg | ORAL_TABLET | Freq: Once | ORAL | 0 refills | Status: DC

## 2020-10-17 NOTE — Telephone Encounter (Signed)
Medication called in per request for the patients cardiac CT

## 2020-10-17 NOTE — Telephone Encounter (Signed)
Pt c/o medication issue:  1. Name of Medication: Lopressor Metoprolol   2. How are you currently taking this medication (dosage and times per day)? Na   3. Are you having a reaction (difficulty breathing--STAT)? No   4. What is your medication issue? Pt stated that she was in the office the other day and suppose to have a Lopressor called in but she check with her pharmacy and she stated it was not called in ?   [place], [place] - [address]  [address] [place], [place] [address]  Phone:  [phone] Fax:  [phone]

## 2020-10-18 ENCOUNTER — Telehealth (HOSPITAL_COMMUNITY): Payer: Self-pay | Admitting: Emergency Medicine

## 2020-10-18 NOTE — Telephone Encounter (Signed)
Attempted to call patient regarding upcoming cardiac CT appointment. °Left message on voicemail with name and callback number °Haven Foss RN Navigator Cardiac Imaging ° Heart and Vascular Services °[phone] Office °[phone] Cell ° °

## 2020-10-19 ENCOUNTER — Telehealth (HOSPITAL_COMMUNITY): Payer: Self-pay | Admitting: Emergency Medicine

## 2020-10-19 ENCOUNTER — Other Ambulatory Visit: Payer: Self-pay

## 2020-10-19 ENCOUNTER — Encounter: Payer: PPO | Admitting: *Deleted

## 2020-10-19 ENCOUNTER — Encounter (HOSPITAL_COMMUNITY): Payer: Self-pay

## 2020-10-19 ENCOUNTER — Ambulatory Visit (HOSPITAL_COMMUNITY)
Admission: RE | Admit: 2020-10-19 | Discharge: 2020-10-19 | Disposition: A | Discharge status: Home / Self Care | Payer: PPO | Source: Ambulatory Visit | Attending: Cardiology | Admitting: Cardiology

## 2020-10-19 DIAGNOSIS — R079 Chest pain, unspecified: Secondary | ICD-10-CM | POA: Diagnosis not present

## 2020-10-19 DIAGNOSIS — Z006 Encounter for examination for normal comparison and control in clinical research program: Secondary | ICD-10-CM

## 2020-10-19 DIAGNOSIS — R002 Palpitations: Secondary | ICD-10-CM | POA: Diagnosis not present

## 2020-10-19 DIAGNOSIS — I700 Atherosclerosis of aorta: Secondary | ICD-10-CM | POA: Insufficient documentation

## 2020-10-19 MED ORDER — IOHEXOL 350 MG/ML SOLN
80.0000 mL | Freq: Once | INTRAVENOUS | Status: AC | PRN
  Administered 2020-10-19: 80 mL via INTRAVENOUS

## 2020-10-19 MED ORDER — NITROGLYCERIN 0.4 MG SL SUBL: 0.8000 mg | SUBLINGUAL_TABLET | Freq: Once | SUBLINGUAL | Status: DC

## 2020-10-19 MED ORDER — NITROGLYCERIN 0.4 MG SL SUBL
SUBLINGUAL_TABLET | SUBLINGUAL | Status: AC
  Filled 2020-10-19: qty 2

## 2020-10-19 NOTE — Telephone Encounter (Signed)
Pt reports her ZIO patch is finished today and will remove prior to CCTA appt.  Marchia Bond RN Navigator Cardiac Imaging [facility] [phone] Office  [phone] Cell

## 2020-10-19 NOTE — Research (Signed)
IDENTIFY Informed Consent                  Subject Name:   Crystal Oneal   Subject met inclusion and exclusion criteria.  The informed consent form, study requirements and expectations were reviewed with the subject and questions and concerns were addressed prior to the signing of the consent form.  The subject verbalized understanding of the trial requirements.  The subject agreed to participate in the IDENTIFY trial and signed the informed consent.  The informed consent was obtained prior to performance of any protocol-specific procedures for the subject.  A copy of the signed informed consent was given to the subject and a copy was placed in the subject's medical record.   [place] Ej Pinson, Research Assistant   10/19/2020  14:21 p.m.

## 2020-10-21 ENCOUNTER — Telehealth: Payer: Self-pay | Admitting: Cardiology

## 2020-10-21 NOTE — Telephone Encounter (Signed)
Called patient back. No answer, no way to leave a voicemail.

## 2020-10-21 NOTE — Telephone Encounter (Signed)
Patient returning call for CT results. 

## 2020-10-24 ENCOUNTER — Telehealth: Payer: Self-pay | Admitting: Cardiology

## 2020-10-24 ENCOUNTER — Ambulatory Visit: Payer: PPO | Admitting: Cardiology

## 2020-10-24 DIAGNOSIS — L820 Inflamed seborrheic keratosis: Secondary | ICD-10-CM | POA: Diagnosis not present

## 2020-10-24 DIAGNOSIS — D0439 Carcinoma in situ of skin of other parts of face: Secondary | ICD-10-CM | POA: Diagnosis not present

## 2020-10-24 NOTE — Telephone Encounter (Signed)
Spoke with patient about CT results. Patient verbalizes understanding. No further questions or concerns at this time.

## 2020-10-24 NOTE — Telephone Encounter (Signed)
Patient returning call for CT results. 

## 2020-10-25 DIAGNOSIS — R002 Palpitations: Secondary | ICD-10-CM | POA: Diagnosis not present

## 2020-10-30 ENCOUNTER — Observation Stay (HOSPITAL_COMMUNITY)
Admission: EM | Admit: 2020-10-30 | Discharge: 2020-10-30 | Disposition: A | Discharge status: Home / Self Care | Payer: PPO | Attending: Emergency Medicine | Admitting: Emergency Medicine

## 2020-10-30 ENCOUNTER — Encounter (HOSPITAL_COMMUNITY): Payer: Self-pay | Admitting: Emergency Medicine

## 2020-10-30 ENCOUNTER — Emergency Department (HOSPITAL_COMMUNITY): Payer: PPO

## 2020-10-30 ENCOUNTER — Observation Stay (HOSPITAL_BASED_OUTPATIENT_CLINIC_OR_DEPARTMENT_OTHER): Payer: PPO

## 2020-10-30 ENCOUNTER — Other Ambulatory Visit: Payer: Self-pay

## 2020-10-30 DIAGNOSIS — E871 Hypo-osmolality and hyponatremia: Secondary | ICD-10-CM | POA: Diagnosis not present

## 2020-10-30 DIAGNOSIS — Z7982 Long term (current) use of aspirin: Secondary | ICD-10-CM | POA: Diagnosis not present

## 2020-10-30 DIAGNOSIS — R110 Nausea: Secondary | ICD-10-CM | POA: Diagnosis not present

## 2020-10-30 DIAGNOSIS — R002 Palpitations: Secondary | ICD-10-CM | POA: Diagnosis not present

## 2020-10-30 DIAGNOSIS — R61 Generalized hyperhidrosis: Secondary | ICD-10-CM | POA: Diagnosis not present

## 2020-10-30 DIAGNOSIS — I472 Ventricular tachycardia: Secondary | ICD-10-CM

## 2020-10-30 DIAGNOSIS — R0789 Other chest pain: Secondary | ICD-10-CM | POA: Diagnosis not present

## 2020-10-30 DIAGNOSIS — Z20822 Contact with and (suspected) exposure to covid-19: Secondary | ICD-10-CM | POA: Diagnosis not present

## 2020-10-30 DIAGNOSIS — R55 Syncope and collapse: Secondary | ICD-10-CM | POA: Diagnosis not present

## 2020-10-30 DIAGNOSIS — I10 Essential (primary) hypertension: Secondary | ICD-10-CM | POA: Diagnosis not present

## 2020-10-30 DIAGNOSIS — E878 Other disorders of electrolyte and fluid balance, not elsewhere classified: Secondary | ICD-10-CM

## 2020-10-30 DIAGNOSIS — Z85828 Personal history of other malignant neoplasm of skin: Secondary | ICD-10-CM | POA: Diagnosis not present

## 2020-10-30 DIAGNOSIS — R079 Chest pain, unspecified: Secondary | ICD-10-CM

## 2020-10-30 LAB — CBC
HCT: 39.9 % (ref 36.0–46.0)
Hemoglobin: 13.6 g/dL (ref 12.0–15.0)
MCH: 30.2 pg (ref 26.0–34.0)
MCHC: 34.1 g/dL (ref 30.0–36.0)
MCV: 88.5 fL (ref 80.0–100.0)
Platelets: 245 10*3/uL (ref 150–400)
RBC: 4.51 MIL/uL (ref 3.87–5.11)
RDW: 11.6 % (ref 11.5–15.5)
WBC: 4.7 10*3/uL (ref 4.0–10.5)
nRBC: 0 % (ref 0.0–0.2)

## 2020-10-30 LAB — URINALYSIS, ROUTINE W REFLEX MICROSCOPIC
Bilirubin Urine: NEGATIVE
Glucose, UA: NEGATIVE mg/dL
Hgb urine dipstick: NEGATIVE
Ketones, ur: NEGATIVE mg/dL
Leukocytes,Ua: NEGATIVE
Nitrite: NEGATIVE
Protein, ur: NEGATIVE mg/dL
Specific Gravity, Urine: 1.003 — ABNORMAL LOW (ref 1.005–1.030)
pH: 7 (ref 5.0–8.0)

## 2020-10-30 LAB — BASIC METABOLIC PANEL
Anion gap: 8 (ref 5–15)
BUN: 12 mg/dL (ref 8–23)
CO2: 26 mmol/L (ref 22–32)
Calcium: 9.7 mg/dL (ref 8.9–10.3)
Chloride: 96 mmol/L — ABNORMAL LOW (ref 98–111)
Creatinine, Ser: 0.77 mg/dL (ref 0.44–1.00)
GFR, Estimated: >60 mL/min (ref >60)
Glucose, Bld: 103 mg/dL — ABNORMAL HIGH (ref 70–99)
Potassium: 3.7 mmol/L (ref 3.5–5.1)
Sodium: 130 mmol/L — ABNORMAL LOW (ref 135–145)

## 2020-10-30 LAB — CBG MONITORING, ED
Glucose-Capillary: 91 mg/dL (ref 70–99)
Glucose-Capillary: 174 mg/dL — ABNORMAL HIGH (ref 70–99)

## 2020-10-30 LAB — SARS CORONAVIRUS 2 (TAT 6-24 HRS): SARS Coronavirus 2: NEGATIVE

## 2020-10-30 LAB — TROPONIN I (HIGH SENSITIVITY)
Troponin I (High Sensitivity): <2 ng/L (ref <18)
Troponin I (High Sensitivity): <2 ng/L (ref <18)

## 2020-10-30 LAB — D-DIMER, QUANTITATIVE: D-Dimer, Quant: <0.27 ug/mL-FEU (ref 0.00–0.50)

## 2020-10-30 MED ORDER — SODIUM CHLORIDE 0.9 % IV SOLN: Freq: Once | INTRAVENOUS | Status: AC

## 2020-10-30 MED ORDER — SODIUM CHLORIDE 0.9 % IV BOLUS
1000.0000 mL | Freq: Once | INTRAVENOUS | Status: AC
  Administered 2020-10-30: 1000 mL via INTRAVENOUS

## 2020-10-30 MED ORDER — SODIUM CHLORIDE 0.9 % IV SOLN: INTRAVENOUS | Status: DC

## 2020-10-30 NOTE — Consult Note (Addendum)
[facility] Medical Consultation  Crystal Oneal IWL:[id number] DOB: 09/06/1950 DOA: 10/30/2020 PCP: Rochel Brome, MD   Requesting physician: Domenic Moras, PA-C Date of consultation: 10/30/2020 Reason for consultation: Syncope and chest pain  Impression/Recommendations  Syncope 2/2 medication: Acute.  Patient presents after having a syncopal episode while at church.  Reported having chest pain and subsequently took a nitroglycerin prior to passing out.  She had recently had a Holter monitor placed by cardiology.  Suspect syncope secondary to hypotension related with nitroglycerin and recently being started on HCTZ. -Admit to a cardiac telemetry bed  -Add-on D-dimer(<0.27) -Check echocardiogram  -Case discussed with cardiology who agreed with stat echocardiogram and discharged home to follow-up with cardiology in outpatient setting if echocardiogram normal. -Would recommend patient not to take nitroglycerin for palpitations   Chest pain: Acute on chronic.  Cardiac troponin had been negative and EKG without ischemic changes.  She just recently had a coronary CT that had a calcium score of 0 on 3/9. -Continue outpatient follow-up with Dr. Harriet Masson of cardiology  Nonsustained SVT: Patient had recently worn a Holter monitor which noted heart rate 53-158 bpm with predominant sinus rhythm.  First-degree heart block was noted with 2 supraventricular tachycardia runs lasting between 5-6 beats. -Follow-up telemetry overnight   Hyponatremia and hypochloremia: Acute.  Suspect secondary to recently started hydrochlorothiazide medication. -Recheck levels in outpatient setting  I will followup again tomorrow. Please contact me if I can be of assistance in the meanwhile. Thank you for this consultation.  Chief Complaint: Passed out and chest pain  HPI:  Crystal Oneal is a 70 y.o. female with medical history significant ofhypertension, palpitations, GERD, and arthritis who presents after passing  out while at church.  She had been experiencing intermittent episodes of chest pain and palpitations.  Patient had been sent to Dr. Harriet Masson of cardiology for her symptoms.  They had worked her up and she had had negative coronary CT with coronary calcium score of 0 on 3/9, and had worn a heart monitor.  The heart monitor noted mostly sinus rhythm with first-degree heart block, but she was found to have had 2 supraventricular tachycardia runs lasting between 5-6 beats while wearing the monitor.  Plan was for her to have an echocardiogram to further evaluate her symptoms.  She reports that she was recently started on new medication of hydrochlorothiazide 12.5 mg daily for her blood pressure as well by cardiology.  However, this morning she had gone to church and was standing up singing when she stood up and felt her heart racing and developed some chest tightness and discomfort.  She sat down and took a nitroglycerin and everything seemed to get dimmer and dimmer so she put her head in between her legs.  When she sat back up she thinks that she passed out for few seconds as she woke up leaning on her husband.  After patient came  to she reported feeling nauseous, broke out in a sweat, and had an episode of loose stool.  Bowel movement she felt little better.  Patient feels that she drinks an adequate amount of water per day.  ED Course: On admission to the emergency department patient was seen to be afebrile, pulse 61-75, respirations 13-23, blood pressure 130/81-165/75, and O2 saturations maintained on room air.  Orthostatic vital signs to be positive. Labs significant for sodium 130, chloride 96, and high-sensitivity troponins negative x2.  Urinalysis did not show any acute abnormalities.  Patient had been given 1 L  of normal saline IV fluids.  [facility] called to admit.  However, given recent cardiac work-up discussed case with Dr. Radford Pax cardiology who agreed with getting his stay echocardiogram and checking D-dimer.   If negative recommended discharging patient home to follow-up with cardiology in outpatient clinic.  Review of Systems  Constitutional: Positive for malaise/fatigue. Negative for chills and fever.  HENT: Negative for ear discharge and nosebleeds.   Eyes: Negative for double vision and photophobia.  Cardiovascular: Positive for chest pain and palpitations.  Gastrointestinal: Positive for nausea. Negative for abdominal pain and vomiting.  Genitourinary: Negative for dysuria and hematuria.  Musculoskeletal: Negative.   Skin: Negative for rash.  Neurological: Positive for loss of consciousness. Negative for focal weakness.        Past Medical History:  Diagnosis Date  . Age-related osteoporosis without current pathological fracture   . Arthritis   . Cancer ([place]) 04/2020   SCC skin cancer removed from nose  . Cervical stenosis of spine    C11-12  . Dysphagia    Esophageal dilation x2  . Essential (primary) hypertension   . GERD (gastroesophageal reflux disease)   . Hypertension   . Mixed hyperlipidemia   . PONV (postoperative nausea and vomiting)   . Spondylolisthesis of lumbar region    Past Surgical History:  Procedure Laterality Date  . ABDOMINAL HYSTERECTOMY    . ANTERIOR AND POSTERIOR REPAIR N/A 03/20/2016   Procedure: REPAIR CYSTOCELE AND RECTOCELE;  Surgeon: Bjorn Loser, MD;  Location: [facility];  Service: Urology;  Laterality: N/A;  . BACK SURGERY  2019   has hardware  . COLONOSCOPY    . ESOPHAGEAL DILATION  2005   x 2  . 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 VAGINAL HYSTERECTOMY Bilateral 03/20/2016   Procedure: TOTAL VAGINAL HYSTERECTOMY WITH BILATERAL SALPINGO OOPHORECTOMY mccall coloplasty;  Surgeon: Servando Salina, MD;  Location: [facility];  Service: Gynecology;  Laterality: Bilateral;  . VAGINAL PROLAPSE REPAIR N/A 03/20/2016   Procedure: REPAIR VAULT PROLAPSE AND GRAFT;  Surgeon: Bjorn Loser, MD;  Location: [facility];  Service: Urology;   Laterality: N/A;   Social History:  reports that she has never smoked. She has never used smokeless tobacco. She reports current alcohol use of about 1.0 standard drink of alcohol per week. She reports that she does not use drugs.  Allergies  Allergen Reactions  . Pneumovax 23 [Pneumococcal Vac Polyvalent] Palpitations    tachycardia  . Lisinopril Cough  . Sulfa Antibiotics Rash   Family History  Problem Relation Age of Onset  . Uterine cancer Mother   . Breast cancer Mother   . Lung disease Father   . Breast cancer Sister   . Multiple myeloma Brother     Prior to Admission medications   Medication Sig Start Date End Date Taking? Authorizing Provider  alendronate (FOSAMAX) 70 MG tablet Take 1 tablet (70 mg total) by mouth every Friday. Take with a full glass of water on an empty stomach. Patient taking differently: Take 70 mg by mouth once a week. Take with a full glass of water on an empty stomach. 06/17/20  Yes Rip Harbour, NP  aspirin EC 81 MG tablet Take 81 mg by mouth daily.   Yes [provider]  b complex vitamins tablet Take 1 tablet by mouth daily.   Yes [provider]  calcium citrate-vitamin D (CITRACAL+D) 315-200 MG-UNIT tablet Take 2 tablets by mouth 2 (two) times daily.  Yes [provider]  cetirizine (ZYRTEC) 10 MG tablet Take 5 mg by mouth daily as needed for allergies.   Yes [provider]  Cholecalciferol (VITAMIN D) 2000 units tablet Take 2,000 Units by mouth daily.   Yes [provider]  fluticasone (FLONASE) 50 MCG/ACT nasal spray Place 1 spray into both nostrils daily as needed for allergies or rhinitis.   Yes [provider]  Ginger, Zingiber officinalis, (GINGER ROOT) 550 MG CAPS Take 550 mg by mouth daily.   Yes [provider]  hydrochlorothiazide (MICROZIDE) 12.5 MG capsule Take 1 capsule (12.5 mg total) by mouth daily. 10/13/20 01/11/21 Yes Richardo Priest, MD  losartan (COZAAR) 50 MG  tablet Take 1 tablet (50 mg total) by mouth daily. 09/26/20  Yes Rip Harbour, NP  melatonin 1 MG TABS tablet Take 1 mg by mouth at bedtime.   Yes [provider]  nitroGLYCERIN (NITROSTAT) 0.4 MG SL tablet Place 1 tablet (0.4 mg total) under the tongue every 5 (five) minutes as needed for chest pain. 09/21/20  Yes Rip Harbour, NP  Olopatadine HCl (PATADAY OP) Place 1 drop into both eyes daily as needed (allergy).   Yes [provider]  Omega-3 Fatty Acids (FISH OIL PO) Take 1 capsule by mouth daily.   Yes [provider]  pantoprazole (PROTONIX) 40 MG tablet Take 1 tablet (40 mg total) by mouth daily. 09/26/20  Yes Rip Harbour, NP  TURMERIC CURCUMIN PO Take 750 mg by mouth daily.   Yes [provider]  vitamin C (ASCORBIC ACID) 500 MG tablet Take 500 mg by mouth daily.   Yes [provider]  metoprolol tartrate (LOPRESSOR) 100 MG tablet Take 1 tablet (100 mg total) by mouth once for 1 dose. Take by mouth two hours prior to your cardiac CT Patient not taking: Reported on 10/30/2020 10/17/20 10/17/20  Berniece Salines, DO   Physical Exam:  Constitutional: Elderly female currently in NAD, calm, comfortable Vitals:   10/30/20 1400 10/30/20 1416 10/30/20 1430 10/30/20 1500  BP: (!) 162/78 (!) 160/82 (!) 160/84 (!) 154/75  Pulse: 73 64 67 71  Resp: (!) 21 16 15 13   Temp:      TempSrc:      SpO2: 100% 100% 100% 100%   Eyes: PERRL, lids and conjunctivae normal ENMT: Mucous membranes are moist. Posterior pharynx clear of any exudate or lesions.Normal dentition.  Neck: normal, supple, no masses, no thyromegaly Respiratory: clear to auscultation bilaterally, no wheezing, no crackles. Normal respiratory effort. No accessory muscle use.  Cardiovascular: Regular rate and rhythm, no murmurs / rubs / gallops. No extremity edema. 2+ pedal pulses. No carotid bruits.  Abdomen: no tenderness, no masses palpated. No hepatosplenomegaly. Bowel sounds positive.   Musculoskeletal: no clubbing / cyanosis. No joint deformity upper and lower extremities. Good ROM, no contractures. Normal muscle tone.  Skin: no rashes, lesions, ulcers. No induration Neurologic: CN 2-12 grossly intact. Sensation intact, DTR normal. Strength 5/5 in all 4.  Psychiatric: Normal judgment and insight. Alert and oriented x 3. Normal mood.   Labs on Admission:  Basic Metabolic Panel: Recent Labs  Lab 10/30/20 1138  NA 130*  K 3.7  CL 96*  CO2 26  GLUCOSE 103*  BUN 12  CREATININE 0.77  CALCIUM 9.7   Liver Function Tests: No results for input(s): AST, ALT, ALKPHOS, BILITOT, PROT, ALBUMIN in the last 168 hours. No results for input(s): LIPASE, AMYLASE in the last 168 hours. No results for  input(s): AMMONIA in the last 168 hours. CBC: Recent Labs  Lab 10/30/20 1138  WBC 4.7  HGB 13.6  HCT 39.9  MCV 88.5  PLT 245   Cardiac Enzymes: No results for input(s): CKTOTAL, CKMB, CKMBINDEX, TROPONINI in the last 168 hours. BNP: Invalid input(s): POCBNP CBG: Recent Labs  Lab 10/30/20 1436  GLUCAP 91    Radiological Exams on Admission: DG Chest 2 View  Result Date: 10/30/2020 CLINICAL DATA:  Syncopal episode today. EXAM: CHEST - 2 VIEW COMPARISON:  PA and lateral chest 09/21/2020. FINDINGS: Lungs clear. Heart size normal. No pneumothorax or pleural fluid. No acute or focal bony abnormality. IMPRESSION: Negative chest. Electronically Signed   By: Inge Rise M.D.   On: 10/30/2020 12:51    EKG: Independently reviewed. Normal sinus rhythm at 65 bpm with first-degree heart block right atrial enlargement  Time spent: >45 minutes  [facility]   If 7PM-7AM, please contact night-coverage

## 2020-10-30 NOTE — Progress Notes (Signed)
  Echocardiogram 2D Echocardiogram has been performed.  Crystal Oneal 10/30/2020, 5:14 PM

## 2020-10-30 NOTE — ED Provider Notes (Signed)
[facility] EMERGENCY DEPARTMENT Provider Note   CSN: [id number] Arrival date & time: 10/30/20  1053     History Chief Complaint  Patient presents with  . Chest Pain    Crystal Oneal is a 70 y.o. female.  The history is provided by the patient and medical records. No language interpreter was used.  Chest Pain    70 year old female significant history of hypertension, hyperlipidemia, GERD, skin cancer, presents ED for evaluation of syncope.  Patient report she was at church today, and while standing and singing she felt her heart racing as well as having some pressure sensation in her chest.  She took 1 sublingual nitro and shortly after she felt lightheadedness, break out in a sweat and sat down.  She believes she may have very transient syncopal episode lasting for few seconds but did not fall to the ground.  She did felt nauseous at that time.  States symptoms lasting for approximately 5 minutes and has since resolved.  She admits that she has had high blood pressure and recently had her blood pressure medication change approximately 3 weeks ago.  She has also pay more attention to her diet and eat mostly fruit and vegetable.  She did have breakfast this morning.  She denies any significant headache, fever chills or cold symptoms.  She denies any shortness of breath abdominal pain back pain focal numbness or focal weakness.  She mention she has had recent cardiac work-up with cardiac CT scan that was unremarkable.  Past Medical History:  Diagnosis Date  . Age-related osteoporosis without current pathological fracture   . Arthritis   . Cancer ([place]) 04/2020   SCC skin cancer removed from nose  . Cervical stenosis of spine    C11-12  . Dysphagia    Esophageal dilation x2  . Essential (primary) hypertension   . GERD (gastroesophageal reflux disease)   . Hypertension   . Mixed hyperlipidemia   . PONV (postoperative nausea and vomiting)   . Spondylolisthesis of  lumbar region     Patient Active Problem List   Diagnosis Date Noted  . PONV (postoperative nausea and vomiting)   . Hypertension   . GERD (gastroesophageal reflux disease)   . Dysphagia   . Arthritis   . Age-related osteoporosis without current pathological fracture   . Mixed hyperlipidemia   . Cancer ([place]) 04/2020  . Right foot pain 02/03/2020  . Essential (primary) hypertension 04/14/2019  . Spondylolisthesis, lumbar region 04/14/2019  . Cystocele 03/20/2016  . Spinal stenosis of lumbar region 06/24/2013  . Hip pain 06/24/2013    Past Surgical History:  Procedure Laterality Date  . ABDOMINAL HYSTERECTOMY    . ANTERIOR AND POSTERIOR REPAIR N/A 03/20/2016   Procedure: REPAIR CYSTOCELE AND RECTOCELE;  Surgeon: Bjorn Loser, MD;  Location: [facility];  Service: Urology;  Laterality: N/A;  . BACK SURGERY  2019   has hardware  . COLONOSCOPY    . ESOPHAGEAL DILATION  2005   x 2  . 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 VAGINAL HYSTERECTOMY Bilateral 03/20/2016   Procedure: TOTAL VAGINAL HYSTERECTOMY WITH BILATERAL SALPINGO OOPHORECTOMY mccall coloplasty;  Surgeon: Servando Salina, MD;  Location: [facility];  Service: Gynecology;  Laterality: Bilateral;  . VAGINAL PROLAPSE REPAIR N/A 03/20/2016   Procedure: REPAIR VAULT PROLAPSE AND GRAFT;  Surgeon: Bjorn Loser, MD;  Location: [facility];  Service: Urology;  Laterality: N/A;     OB History   No  obstetric history on file.     Family History  Problem Relation Age of Onset  . Uterine cancer Mother   . Breast cancer Mother   . Lung disease Father   . Breast cancer Sister   . Multiple myeloma Brother     Social History   Tobacco Use  . Smoking status: Never Smoker  . Smokeless tobacco: Never Used  Vaping Use  . Vaping Use: Never used  Substance Use Topics  . Alcohol use: Yes    Alcohol/week: 1.0 standard drink    Types: 1 Glasses of wine per week    Comment: rarely  . Drug use: Never    Home  Medications Prior to Admission medications   Medication Sig Start Date End Date Taking? Authorizing Provider  alendronate (FOSAMAX) 70 MG tablet Take 1 tablet (70 mg total) by mouth every Friday. Take with a full glass of water on an empty stomach. 06/17/20   Rip Harbour, NP  aspirin EC 81 MG tablet Take 81 mg by mouth daily.    [provider]  b complex vitamins tablet Take 1 tablet by mouth daily.    [provider]  calcium citrate-vitamin D (CITRACAL+D) 315-200 MG-UNIT tablet Take 2 tablets by mouth 2 (two) times daily.    [provider]  cetirizine (ZYRTEC) 10 MG tablet Take 5 mg by mouth daily as needed for allergies.    [provider]  Cholecalciferol (VITAMIN D) 2000 units tablet Take 2,000 Units by mouth daily.    [provider]  fluticasone (FLONASE) 50 MCG/ACT nasal spray Place 1 spray into both nostrils daily as needed for allergies or rhinitis.    [provider]  Ginger, Zingiber officinalis, (GINGER ROOT) 550 MG CAPS Take 550 mg by mouth daily.    [provider]  hydrochlorothiazide (MICROZIDE) 12.5 MG capsule Take 1 capsule (12.5 mg total) by mouth daily. 10/13/20 01/11/21  Richardo Priest, MD  losartan (COZAAR) 50 MG tablet Take 1 tablet (50 mg total) by mouth daily. 09/26/20   Rip Harbour, NP  metoprolol tartrate (LOPRESSOR) 100 MG tablet Take 1 tablet (100 mg total) by mouth once for 1 dose. Take by mouth two hours prior to your cardiac CT 10/17/20 10/17/20  Tobb, Godfrey Pick, DO  nitroGLYCERIN (NITROSTAT) 0.4 MG SL tablet Place 1 tablet (0.4 mg total) under the tongue every 5 (five) minutes as needed for chest pain. 09/21/20   Rip Harbour, NP  Omega-3 Fatty Acids (FISH OIL PO) Take 1,280 mg by mouth daily.    [provider]  pantoprazole (PROTONIX) 40 MG tablet Take 1 tablet (40 mg total) by mouth daily. 09/26/20   Rip Harbour, NP  TURMERIC CURCUMIN PO Take 750 mg by mouth daily.    [provider]  vitamin C (ASCORBIC ACID) 500 MG tablet Take 500 mg by mouth daily.    [provider]    Allergies    Pneumovax 23 [pneumococcal vac polyvalent], Lisinopril, and Sulfa antibiotics  Review of Systems   Review of Systems  Cardiovascular: Positive for chest pain.  All other systems reviewed and are negative.   Physical Exam Updated Vital Signs BP (!) 146/82 (BP Location: Right Arm)   Pulse 62   Temp 97.6 F (36.4 C) (Oral)   Resp 18   SpO2 100%   Physical Exam Vitals and nursing note reviewed.  Constitutional:      General: She is not in acute distress.  Appearance: She is well-developed.  HENT:     Head: Atraumatic.  Eyes:     Conjunctiva/sclera: Conjunctivae normal.  Cardiovascular:     Rate and Rhythm: Normal rate and regular rhythm.     Pulses: Normal pulses.     Heart sounds: Normal heart sounds.  Pulmonary:     Effort: Pulmonary effort is normal.     Breath sounds: Normal breath sounds.  Abdominal:     General: Abdomen is flat.     Palpations: Abdomen is soft.     Tenderness: There is no abdominal tenderness.  Musculoskeletal:     Cervical back: Normal range of motion and neck supple.     Right lower leg: No edema.     Left lower leg: No edema.  Skin:    Findings: No rash.  Neurological:     Mental Status: She is alert and oriented to person, place, and time.  Psychiatric:        Mood and Affect: Mood normal.     ED Results / Procedures / Treatments   Labs (all labs ordered are listed, but only abnormal results are displayed) Labs Reviewed  BASIC METABOLIC PANEL - Abnormal; Notable for the following components:      Result Value   Sodium 130 (*)    Chloride 96 (*)    Glucose, Bld 103 (*)    All other components within normal limits  CBC  CBG MONITORING, ED  TROPONIN I (HIGH SENSITIVITY)  TROPONIN I (HIGH SENSITIVITY)    EKG EKG Interpretation  Date/Time:  Sunday October 30 2020 11:00:59 EDT Ventricular Rate:   65 PR Interval:  200 QRS Duration: 68 QT Interval:  406 QTC Calculation: 422 R Axis:   80 Text Interpretation: Normal sinus rhythm Right atrial enlargement Septal infarct , age undetermined Abnormal ECG no change from previous Confirmed by Charlesetta Shanks [phone]) on 10/30/2020 1:34:14 PM   ED ECG REPORT   Date: 10/30/2020  Rate: 65  Rhythm: normal sinus rhythm  QRS Axis: normal  Intervals: normal  ST/T Wave abnormalities: nonspecific T wave changes  Conduction Disutrbances:none  Narrative Interpretation:   Old EKG Reviewed: unchanged  I have personally reviewed the EKG tracing and agree with the computerized printout as noted.   Radiology DG Chest 2 View  Result Date: 10/30/2020 CLINICAL DATA:  Syncopal episode today. EXAM: CHEST - 2 VIEW COMPARISON:  PA and lateral chest 09/21/2020. FINDINGS: Lungs clear. Heart size normal. No pneumothorax or pleural fluid. No acute or focal bony abnormality. IMPRESSION: Negative chest. Electronically Signed   By: Inge Rise M.D.   On: 10/30/2020 12:51    Procedures Procedures   Medications Ordered in ED Medications  sodium chloride 0.9 % bolus 1,000 mL (0 mLs Intravenous Stopped 10/30/20 1524)    And  0.9 %  sodium chloride infusion (has no administration in time range)    ED Course  I have reviewed the triage vital signs and the nursing notes.  Pertinent labs & imaging results that were available during my care of the patient were reviewed by me and considered in my medical decision making (see chart for details).    MDM Rules/Calculators/A&P                          BP (!) 150/79   Pulse 65   Temp 97.6 F (36.4 C) (Oral)   Resp 16   SpO2 100%   Final Clinical Impression(s) / ED Diagnoses  Final diagnoses:  Syncope and collapse  Chest pain, unspecified type    Rx / DC Orders ED Discharge Orders    None     11:46 AM Patient reported near syncope/syncope while at church today.  Reported having some chest  pressure with diaphoresis and nausea shortly after she took sublingual nitro when she noticed heart palpitation.  The symptom is since resolved and she is back to her baseline.  Also reported recent medication changes including additional blood pressure medication that was started  approximately 3 weeks ago.  She also report having recent coronary CT scan that was unremarkable.  Work-up initiated.  1:35 PM Patient did report having some mild chest discomfort during this episode.  He without any acute changes, initial troponin unremarkable, labs are mostly reassuring.  Mild hyponatremia with a sodium of 130. HEART score of 6, although pt is cp free, plan to consult medicine for admission for cardiac rule out.    3:19 PM I have consulted [facility] and spoke with Dr. Tamala Julian who agrees to admit pt for obs, but request cardiology to be involve as well.  Will consult cardiology.   3:25 PM Appreciate consultation from on call cardiologist Dr. Domenic Polite who will be involve in pt care.    Domenic Moras, PA-C 10/30/20 1526    Hayden Rasmussen, MD 10/30/20 [phone]

## 2020-10-30 NOTE — ED Provider Notes (Signed)
  Physical Exam  BP (!) 154/75   Pulse 71   Temp 97.7 F (36.5 C) (Oral)   Resp 13   SpO2 100%   Physical Exam Vitals and nursing note reviewed.  Constitutional:      General: She is not in acute distress.    Appearance: She is not ill-appearing, toxic-appearing or diaphoretic.  HENT:     Head: Normocephalic.  Eyes:     General: No scleral icterus.       Right eye: No discharge.        Left eye: No discharge.  Cardiovascular:     Rate and Rhythm: Normal rate.  Pulmonary:     Effort: Pulmonary effort is normal. No tachypnea, accessory muscle usage or respiratory distress.     Breath sounds: No stridor.  Skin:    General: Skin is warm and dry.  Neurological:     General: No focal deficit present.     Mental Status: She is alert.  Psychiatric:        Behavior: Behavior is cooperative.     ED Course/Procedures     Procedures  MDM  Care of patient assumed from PA [place] at 1600.  Agree with history, physical exam and plan.  See their note for further details. Briefly, presents ED for evaluation of syncope.  Patient report she was at church today, and while standing and singing she felt her heart racing as well as having some pressure sensation in her chest.  She took 1 sublingual nitro and shortly after she felt lightheadedness, break out in a sweat and sat down.  She believes she may have very transient syncopal episode lasting for few seconds but did not fall to the ground.  She did felt nauseous at that time.  States symptoms lasting for approximately 5 minutes and has since resolved.  She admits that she has had high blood pressure and recently had her blood pressure medication change approximately 3 weeks ago.  She has also pay more attention to her diet and eat mostly fruit and vegetable.  She did have breakfast this morning.  She denies any significant headache, fever chills or cold symptoms.  She denies any shortness of breath abdominal pain back pain focal numbness or focal  weakness.  She mention she has had recent cardiac work-up with cardiac CT scan that was unremarkable.  Previous provider spoke to Dr. Tamala Julian with internal medicine.  He will obtain echocardiogram for patient.  Echocardiogram pending at this time.  Spoke to hospitalist Dr. Tamala Julian was able to see echocardiogram today.  If echocardiogram unremarkable findings.  Plan to discharge patient with close cardiology follow-up.    1851 Dr. Tamala Julian reports that he spoke to cardiologist who reported echocardiogram showed normal function with no valvular disease.    Reviewed note from cardiologist Dr. Clayton Bibles "Formal echo read is pending. I personally reviewed the images. Ms. Rettinger has normal biventricular function with no significant valvular disease."   Will discharge patient and have her follow-up with her cardiologist.  Patient is agreeable with this plan.  Discussed results, findings, treatment and follow up. Patient advised of return precautions. Patient verbalized understanding and agreed with plan.    Dyann Ruddle 10/30/20 1901    Charlesetta Shanks, MD 10/30/20 2113

## 2020-10-30 NOTE — ED Notes (Signed)
Pt has a room for admission though pt prefers to go home if echo is normal.  Echo is completed and Echo technologist is contacting cardiologist to have echo read.  Pt aware

## 2020-10-30 NOTE — Discharge Instructions (Addendum)
Pain to the emergency department today to be evaluated for your chest pain and episode of passing out.  Your lab work, EKG, chest x-ray, and echocardiogram were reassuring.  Your episode of passing out may have been due to low blood pressure, combination of your hydrochlorothiazide and nitroglycerin.  Please avoid taking these medications together until you can see your cardiologist.  Please call your cardiologist Monday morning to schedule a follow-up appointment.  Get help right away if you: Have a severe headache. Faint once or repeatedly. Have pain in your chest, abdomen, or back. Have a very fast or irregular heartbeat (palpitations). Have pain when you breathe. Are bleeding from your mouth or rectum, or you have black or tarry stool. Have a seizure. Are confused. Have trouble walking. Have severe weakness. Have vision problems.

## 2020-10-30 NOTE — ED Notes (Signed)
Pt's CBG result was 91. Informed Gwyndolyn Saxon - RN.

## 2020-10-30 NOTE — ED Triage Notes (Signed)
Pt states she had a syncopal episode at church just PTA.  States everything went dark, R sided chest pain, diaphoresis, nausea.  BP medication added 3 weeks ago.

## 2020-10-30 NOTE — ED Notes (Signed)
During orthostatic vitals (standing), pt stated that she was feeling "tightness in her collar bones". Pt also felt some epigastric pain. Informed [place] - PA.

## 2020-10-30 NOTE — Plan of Care (Signed)
Formal echo read is pending. I personally reviewed the images. Crystal Oneal has normal biventricular function with no significant valvular disease.

## 2020-10-30 NOTE — ED Notes (Signed)
Dr Fuller Plan and NP at bedside

## 2020-10-30 NOTE — ED Notes (Signed)
Receiving echo in room via Leane Para, echo technologist

## 2020-10-31 LAB — ECHOCARDIOGRAM COMPLETE
AR max vel: 2.44 cm2
AV Area VTI: 2.6 cm2
AV Area mean vel: 2.45 cm2
AV Mean grad: 3 mmHg
AV Peak grad: 4.1 mmHg
Ao pk vel: 1.01 m/s
Area-P 1/2: 4.89 cm2
MV VTI: 2.13 cm2
S' Lateral: 2.2 cm

## 2020-11-11 DIAGNOSIS — M4802 Spinal stenosis, cervical region: Secondary | ICD-10-CM | POA: Insufficient documentation

## 2020-11-11 DIAGNOSIS — M4316 Spondylolisthesis, lumbar region: Secondary | ICD-10-CM | POA: Insufficient documentation

## 2020-11-14 ENCOUNTER — Other Ambulatory Visit: Payer: Self-pay

## 2020-11-14 ENCOUNTER — Ambulatory Visit: Payer: PPO | Admitting: Cardiology

## 2020-11-14 ENCOUNTER — Encounter: Payer: Self-pay | Admitting: Cardiology

## 2020-11-14 VITALS — BP 132/70 | HR 70 | Ht 61.0 in | Wt 115.2 lb

## 2020-11-14 DIAGNOSIS — I10 Essential (primary) hypertension: Secondary | ICD-10-CM | POA: Diagnosis not present

## 2020-11-14 DIAGNOSIS — I471 Supraventricular tachycardia: Secondary | ICD-10-CM | POA: Insufficient documentation

## 2020-11-14 DIAGNOSIS — E782 Mixed hyperlipidemia: Secondary | ICD-10-CM

## 2020-11-14 LAB — LIPID PANEL
Chol/HDL Ratio: 2.9 ratio (ref 0.0–4.4)
Cholesterol, Total: 182 mg/dL (ref 100–199)
HDL: 63 mg/dL (ref >39)
LDL Chol Calc (NIH): 107 mg/dL — ABNORMAL HIGH (ref 0–99)
Triglycerides: 61 mg/dL (ref 0–149)
VLDL Cholesterol Cal: 12 mg/dL (ref 5–40)

## 2020-11-14 MED ORDER — METOPROLOL SUCCINATE ER 25 MG PO TB24: 12.5000 mg | ORAL_TABLET | Freq: Every day | ORAL | 3 refills | Status: DC

## 2020-11-14 NOTE — Patient Instructions (Signed)
Medication Instructions:  Your physician has recommended you make the following change in your medication: START: Toprol-XL 12.5 once daily *If you need a refill on your cardiac medications before your next appointment, please call your pharmacy*   Lab Work: None If you have labs (blood work) drawn today and your tests are completely normal, you will receive your results only by: Marland Kitchen MyChart Message (if you have MyChart) OR . A paper copy in the mail If you have any lab test that is abnormal or we need to change your treatment, we will call you to review the results.   Testing/Procedures: None   Follow-Up: At [facility], you and your health needs are our priority.  As part of our continuing mission to provide you with exceptional heart care, we have created designated Provider Care Teams.  These Care Teams include your primary Cardiologist (physician) and Advanced Practice Providers (APPs -  Physician Assistants and Nurse Practitioners) who all work together to provide you with the care you need, when you need it.  We recommend signing up for the patient portal called "MyChart".  Sign up information is provided on this After Visit Summary.  MyChart is used to connect with patients for Virtual Visits (Telemedicine).  Patients are able to view lab/test results, encounter notes, upcoming appointments, etc.  Non-urgent messages can be sent to your provider as well.   To learn more about what you can do with MyChart, go to [email or web address].    Your next appointment:   June 1st  The format for your next appointment:   In Person  Provider:   Berniece Salines, DO   Other Instructions

## 2020-11-14 NOTE — Progress Notes (Signed)
Cardiology Office Note:    Date:  11/14/2020   ID:  [name], DOB 1950/11/17, MRN [id number]  PCP:  Rochel Brome, MD  Cardiologist:  Berniece Salines, DO  Electrophysiologist:  None   Referring MD: Rochel Brome, MD   Intermittent chest pain  History of Present Illness:    Crystal Oneal is a 70 y.o. female with a hx of hypertension, hyperlipidemia, palpitations initially presented to be evaluated for intermittent chest pain and elevated blood pressure.  During her visit we will send the patient for coronary CTA as well as an echocardiogram and due to her palpitation and see a monitor was placed on the patient.  Since her last visit she has had her ZIO monitor read which showed paroxysmal SVT as well as her CT scan did not show any coronary disease.  At her last visit the patient had a ER visit due to the fact that she has significant palpitations and felt as if she passed out.  While she was in the ED she had an echocardiogram which did not show any significant abnormalities with a normal EF. She was hyponatremic at that visit. Patient tells me since her hospitalization she is felt better.  She has had some palpitations but thankfully there is no syncope episodes.  Past Medical History:  Diagnosis Date  . Age-related osteoporosis without current pathological fracture   . Arthritis   . Cancer ([place]) 04/2020   SCC skin cancer removed from nose  . Cervical stenosis of spine    C11-12  . Dysphagia    Esophageal dilation x2  . Essential (primary) hypertension   . GERD (gastroesophageal reflux disease)   . Hypertension   . Mixed hyperlipidemia   . PONV (postoperative nausea and vomiting)   . Spondylolisthesis of lumbar region     Past Surgical History:  Procedure Laterality Date  . ABDOMINAL HYSTERECTOMY    . ANTERIOR AND POSTERIOR REPAIR N/A 03/20/2016   Procedure: REPAIR CYSTOCELE AND RECTOCELE;  Surgeon: Bjorn Loser, MD;  Location: [facility];  Service: Urology;   Laterality: N/A;  . BACK SURGERY  2019   has hardware  . COLONOSCOPY    . ESOPHAGEAL DILATION  2005   x 2  . 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 VAGINAL HYSTERECTOMY Bilateral 03/20/2016   Procedure: TOTAL VAGINAL HYSTERECTOMY WITH BILATERAL SALPINGO OOPHORECTOMY mccall coloplasty;  Surgeon: Servando Salina, MD;  Location: [facility];  Service: Gynecology;  Laterality: Bilateral;  . VAGINAL PROLAPSE REPAIR N/A 03/20/2016   Procedure: REPAIR VAULT PROLAPSE AND GRAFT;  Surgeon: Bjorn Loser, MD;  Location: [facility];  Service: Urology;  Laterality: N/A;    Current Medications: Current Meds  Medication Sig  . alendronate (FOSAMAX) 70 MG tablet Take 1 tablet (70 mg total) by mouth every Friday. Take with a full glass of water on an empty stomach.  Marland Kitchen aspirin EC 81 MG tablet Take 81 mg by mouth daily.  Marland Kitchen b complex vitamins tablet Take 1 tablet by mouth daily.  . calcium citrate-vitamin D (CITRACAL+D) 315-200 MG-UNIT tablet Take 2 tablets by mouth 2 (two) times daily.  . cetirizine (ZYRTEC) 10 MG tablet Take 5 mg by mouth daily as needed for allergies.  . Cholecalciferol (VITAMIN D) 2000 units tablet Take 2,000 Units by mouth daily.  . fluticasone (FLONASE) 50 MCG/ACT nasal spray Place 1 spray into both nostrils daily as needed for allergies or rhinitis.  . Ginger, Zingiber officinalis, (GINGER ROOT) 550  MG CAPS Take 550 mg by mouth daily.  . hydrochlorothiazide (MICROZIDE) 12.5 MG capsule Take 1 capsule (12.5 mg total) by mouth daily.  Marland Kitchen losartan (COZAAR) 50 MG tablet Take 1 tablet (50 mg total) by mouth daily.  . melatonin 1 MG TABS tablet Take 1 mg by mouth at bedtime.  . metoprolol succinate (TOPROL XL) 25 MG 24 hr tablet Take 0.5 tablets (12.5 mg total) by mouth daily.  . nitroGLYCERIN (NITROSTAT) 0.4 MG SL tablet Place 1 tablet (0.4 mg total) under the tongue every 5 (five) minutes as needed for chest pain.  Marland Kitchen Olopatadine HCl (PATADAY OP) Place 1 drop into both eyes  daily as needed (allergy).  . Omega-3 Fatty Acids (FISH OIL PO) Take 1 capsule by mouth daily.  . pantoprazole (PROTONIX) 40 MG tablet Take 1 tablet (40 mg total) by mouth daily.  . TURMERIC CURCUMIN PO Take 750 mg by mouth daily.  . vitamin C (ASCORBIC ACID) 500 MG tablet Take 500 mg by mouth daily.     Allergies:   Pneumovax 23 [pneumococcal vac polyvalent], Lisinopril, and Sulfa antibiotics   Social History   Socioeconomic History  . Marital status: Married    Spouse name: Not on file  . Number of children: 2  . Years of education: Not on file  . Highest education level: Not on file  Occupational History  . Not on file  Tobacco Use  . Smoking status: Never Smoker  . Smokeless tobacco: Never Used  Vaping Use  . Vaping Use: Never used  Substance and Sexual Activity  . Alcohol use: Yes    Alcohol/week: 1.0 standard drink    Types: 1 Glasses of wine per week    Comment: rarely  . Drug use: Never  . Sexual activity: Not on file  Other Topics Concern  . Not on file  Social History Narrative  . Not on file   Social Determinants of Health   Financial Resource Strain: Low Risk   . Difficulty of Paying Living Expenses: Not hard at all  Food Insecurity: No Food Insecurity  . Worried About Charity fundraiser in the Last Year: Never true  . Ran Out of Food in the Last Year: Never true  Transportation Needs: No Transportation Needs  . Lack of Transportation (Medical): No  . Lack of Transportation (Non-Medical): No  Physical Activity: Sufficiently Active  . Days of Exercise per Week: 5 days  . Minutes of Exercise per Session: 30 min  Stress: No Stress Concern Present  . Feeling of Stress : Not at all  Social Connections: Socially Integrated  . Frequency of Communication with Friends and Family: More than three times a week  . Frequency of Social Gatherings with Friends and Family: Twice a week  . Attends Religious Services: More than 4 times per year  . Active Member of  Clubs or Organizations: Yes  . Attends Archivist Meetings: More than 4 times per year  . Marital Status: Married     Family History: The patient's family history includes Breast cancer in her mother and sister; Lung disease in her father; Multiple myeloma in her brother; Uterine cancer in her mother.  ROS:   Review of Systems  Constitution: Negative for decreased appetite, fever and weight gain.  HENT: Negative for congestion, ear discharge, hoarse voice and sore throat.   Eyes: Negative for discharge, redness, vision loss in right eye and visual halos.  Cardiovascular: Negative for chest pain, dyspnea on exertion, leg  swelling, orthopnea and palpitations.  Respiratory: Negative for cough, hemoptysis, shortness of breath and snoring.   Endocrine: Negative for heat intolerance and polyphagia.  Hematologic/Lymphatic: Negative for bleeding problem. Does not bruise/bleed easily.  Skin: Negative for flushing, nail changes, rash and suspicious lesions.  Musculoskeletal: Negative for arthritis, joint pain, muscle cramps, myalgias, neck pain and stiffness.  Gastrointestinal: Negative for abdominal pain, bowel incontinence, diarrhea and excessive appetite.  Genitourinary: Negative for decreased libido, genital sores and incomplete emptying.  Neurological: Negative for brief paralysis, focal weakness, headaches and loss of balance.  Psychiatric/Behavioral: Negative for altered mental status, depression and suicidal ideas.  Allergic/Immunologic: Negative for HIV exposure and persistent infections.    EKGs/Labs/Other Studies Reviewed:    The following studies were reviewed today:   EKG:  None today  Zio monitor  Patch Wear Time:  6 days and 0 hours (2022-03-03T11:48:50-0500 to 2022-03-09T12:08:55-0500)  Patient had a min HR of 53 bpm, max HR of 158 bpm, and avg HR of 77 bpm. Predominant underlying rhythm was Sinus Rhythm. First Degree AV Block was present. 2 Supraventricular  Tachycardia runs occurred, the run with the fastest interval lasting 5 beats  with a max rate of 158 bpm, the longest lasting 6 beats with an avg rate of 105 bpm. Isolated SVEs were rare (<1.0%), SVE Triplets were rare (<1.0%), and no SVE Couplets were present. Isolated VEs were rare (<1.0%), and no VE Couplets or VE Triplets were  present.  There were no pauses of 3 seconds or greater and no episodes of second or third-degree AV nodal block or sinus node exit block. There were 6 triggered and 6 diary events all sinus rhythm. Ventricular ectopy was rare. Supraventricular ectopy was rare.  Conclusion unremarkable event monitor.   TTE 10/30/20  IMPRESSIONS  1. Left ventricular ejection fraction, by estimation, is 60 to 65%. The left ventricle has normal function. The left ventricle has no regional wall motion abnormalities. There is mild concentric left ventricular hypertrophy. Left ventricular diastolic parameters are consistent with Grade I diastolic dysfunction (impaired relaxation).  2. Right ventricular systolic function is normal. The right ventricular size is normal. Tricuspid regurgitation signal is inadequate for assessing PA pressure.  3. The mitral valve is normal in structure. Trivial mitral valve regurgitation. No evidence of mitral stenosis.  4. The aortic valve was not well visualized. Aortic valve regurgitation is not visualized. No aortic stenosis is present. Aortic valve area, by VTI measures 2.60 cm. Aortic valve mean gradient measures 3.0 mmHg. Aortic valve Vmax measures 1.01 m/s.  5. The inferior vena cava is normal in size with greater than 50% respiratory variability, suggesting right atrial pressure of 3 mmHg.   FINDINGS  Left Ventricle: Left ventricular ejection fraction, by estimation, is 60  to 65%. The left ventricle has normal function. The left ventricle has no  regional wall motion abnormalities. The left ventricular internal cavity  size was normal in  size. There is  mild concentric left ventricular hypertrophy. Left ventricular diastolic  parameters are consistent with Grade I diastolic dysfunction (impaired  relaxation). Normal left ventricular filling pressure.   Right Ventricle: The right ventricular size is normal. No increase in  right ventricular wall thickness. Right ventricular systolic function is  normal. Tricuspid regurgitation signal is inadequate for assessing PA  pressure.   Left Atrium: Left atrial size was normal in size.   Right Atrium: Right atrial size was normal in size.   Pericardium: There is no evidence of pericardial effusion.   Mitral  Valve: The mitral valve is normal in structure. Trivial mitral  valve regurgitation. No evidence of mitral valve stenosis. MV peak  gradient, 5.6 mmHg. The mean mitral valve gradient is 2.0 mmHg.   Tricuspid Valve: The tricuspid valve is normal in structure. Tricuspid  valve regurgitation is trivial. No evidence of tricuspid stenosis.   Aortic Valve: The aortic valve was not well visualized. Aortic valve  regurgitation is not visualized. No aortic stenosis is present. Aortic  valve mean gradient measures 3.0 mmHg. Aortic valve peak gradient measures  4.1 mmHg. Aortic valve area, by VTI  measures 2.60 cm.   Pulmonic Valve: The pulmonic valve was normal in structure. Pulmonic valve  regurgitation is not visualized. No evidence of pulmonic stenosis.   Aorta: The aortic root is normal in size and structure.   Venous: The inferior vena cava is normal in size with greater than 50%  respiratory variability, suggesting right atrial pressure of 3 mmHg.   IAS/Shunts: No atrial level shunt detected by color flow Doppler.    Recent Labs: 06/22/2020: ALT 22 10/13/2020: TSH 2.740 10/30/2020: BUN 12; Creatinine, Ser 0.77; Hemoglobin 13.6; Platelets 245; Potassium 3.7; Sodium 130  Recent Lipid Panel    Component Value Date/Time   CHOL 224 (H) 06/16/2020 0837   TRIG 60  06/16/2020 0837   HDL 75 06/16/2020 0837   CHOLHDL 3.0 06/16/2020 0837   LDLCALC 139 (H) 06/16/2020 0837    Physical Exam:    VS:  BP 132/70   Pulse 70   Ht 5' 1"  (1.549 m)   Wt 115 lb 3.2 oz (52.3 kg)   SpO2 96%   BMI 21.77 kg/m     Wt Readings from Last 3 Encounters:  11/14/20 115 lb 3.2 oz (52.3 kg)  10/13/20 114 lb 12.8 oz (52.1 kg)  09/26/20 116 lb (52.6 kg)     GEN: Well nourished, well developed in no acute distress HEENT: Normal NECK: No JVD; No carotid bruits LYMPHATICS: No lymphadenopathy CARDIAC: S1S2 noted,RRR, no murmurs, rubs, gallops RESPIRATORY:  Clear to auscultation without rales, wheezing or rhonchi  ABDOMEN: Soft, non-tender, non-distended, +bowel sounds, no guarding. EXTREMITIES: No edema, No cyanosis, no clubbing MUSCULOSKELETAL:  No deformity  SKIN: Warm and dry NEUROLOGIC:  Alert and oriented x 3, non-focal PSYCHIATRIC:  Normal affect, good insight  ASSESSMENT:    1. PSVT (paroxysmal supraventricular tachycardia) ([place])   2. Essential (primary) hypertension   3. Mixed hyperlipidemia    PLAN:     Since her ER visit she had not had any syncope episode.  She still is experiencing palpitations will like to restart the patient on Toprol-XL 12.5 mg daily.  She had blood work done today we will continue to monitor her sodium.  Should this be low again we will stop her thiazide diuretics and adjust her blood pressure medication as appropriate.  We discussed her coronary CT results which showed 0 calcium as well as no evidence of coronary artery disease.  All of her questions has been answered at this time.  The patient is in agreement with the above plan. The patient left the office in stable condition.  The patient will follow up in 6 weeks due to medication changes.   Medication Adjustments/Labs and Tests Ordered: Current medicines are reviewed at length with the patient today.  Concerns regarding medicines are outlined above.  No orders of the  defined types were placed in this encounter.  Meds ordered this encounter  Medications  . metoprolol succinate (TOPROL XL)  25 MG 24 hr tablet    Sig: Take 0.5 tablets (12.5 mg total) by mouth daily.    Dispense:  45 tablet    Refill:  3    Patient Instructions  Medication Instructions:  Your physician has recommended you make the following change in your medication: START: Toprol-XL 12.5 once daily *If you need a refill on your cardiac medications before your next appointment, please call your pharmacy*   Lab Work: None If you have labs (blood work) drawn today and your tests are completely normal, you will receive your results only by: Marland Kitchen MyChart Message (if you have MyChart) OR . A paper copy in the mail If you have any lab test that is abnormal or we need to change your treatment, we will call you to review the results.   Testing/Procedures: None   Follow-Up: At [facility], you and your health needs are our priority.  As part of our continuing mission to provide you with exceptional heart care, we have created designated Provider Care Teams.  These Care Teams include your primary Cardiologist (physician) and Advanced Practice Providers (APPs -  Physician Assistants and Nurse Practitioners) who all work together to provide you with the care you need, when you need it.  We recommend signing up for the patient portal called "MyChart".  Sign up information is provided on this After Visit Summary.  MyChart is used to connect with patients for Virtual Visits (Telemedicine).  Patients are able to view lab/test results, encounter notes, upcoming appointments, etc.  Non-urgent messages can be sent to your provider as well.   To learn more about what you can do with MyChart, go to [email or web address].    Your next appointment:   June 1st  The format for your next appointment:   In Person  Provider:   Berniece Salines, DO   Other Instructions      Adopting a Healthy  Lifestyle.  Know what a healthy weight is for you (roughly BMI <25) and aim to maintain this   Aim for 7+ servings of fruits and vegetables daily   65-80+ fluid ounces of water or unsweet tea for healthy kidneys   Limit to max 1 drink of alcohol per day; avoid smoking/tobacco   Limit animal fats in diet for cholesterol and heart health - choose grass fed whenever available   Avoid highly processed foods, and foods high in saturated/trans fats   Aim for low stress - take time to unwind and care for your mental health   Aim for 150 min of moderate intensity exercise weekly for heart health, and weights twice weekly for bone health   Aim for 7-9 hours of sleep daily   When it comes to diets, agreement about the perfect plan isnt easy to find, even among the experts. Experts at the [employer] developed an idea known as the Healthy Eating Plate. Just imagine a plate divided into logical, healthy portions.   The emphasis is on diet quality:   Load up on vegetables and fruits - one-half of your plate: Aim for color and variety, and remember that potatoes dont count.   Go for whole grains - one-quarter of your plate: Whole wheat, barley, wheat berries, quinoa, oats, brown rice, and foods made with them. If you want pasta, go with whole wheat pasta.   Protein power - one-quarter of your plate: Fish, chicken, beans, and nuts are all healthy, versatile protein sources. Limit red meat.   The diet,  however, does go beyond the plate, offering a few other suggestions.   Use healthy plant oils, such as olive, canola, soy, corn, sunflower and peanut. Check the labels, and avoid partially hydrogenated oil, which have unhealthy trans fats.   If youre thirsty, drink water. Coffee and tea are good in moderation, but skip sugary drinks and limit milk and dairy products to one or two daily servings.   The type of carbohydrate in the diet is more important than the amount. Some  sources of carbohydrates, such as vegetables, fruits, whole grains, and beans-are healthier than others.   Finally, stay active  Signed, Berniece Salines, DO  11/14/2020 12:04 PM    [facility]

## 2020-11-16 ENCOUNTER — Other Ambulatory Visit: Payer: PPO

## 2020-11-17 ENCOUNTER — Telehealth: Payer: Self-pay

## 2020-11-17 NOTE — Telephone Encounter (Signed)
Left message on patients voicemail to please return our call.   

## 2020-11-17 NOTE — Telephone Encounter (Signed)
-----   Message from Berniece Salines, DO sent at 11/16/2020  8:20 AM EDT ----- Your LDL has improved on the medication so history of total cholesterol.  Good job

## 2020-11-21 ENCOUNTER — Telehealth: Payer: Self-pay | Admitting: Cardiology

## 2020-11-21 NOTE — Telephone Encounter (Signed)
Pt c/o BP issue: STAT if pt c/o blurred vision, one-sided weakness or slurred speech  1. What are your last 5 BP readings?  106/66 117/79 110/64 111/71 122/69 113/65   2. Are you having any other symptoms (ex. Dizziness, headache, blurred vision, passed out)?  Patient states in the morning she feels a lack of energy. Otherwise, she is asymptomatic.   3. What is your BP issue?   Patient states her BP has been low. Per patient, it is mainly low in the morning then it increases as the day goes on. She states Dr. Harriet Masson recently put her on Metoprolol, but she would like to ensure that it is safe to take with low BP.  Please advise.

## 2020-11-21 NOTE — Telephone Encounter (Signed)
Patient advised she can hold metoprolol for now. No further questions.

## 2020-11-21 NOTE — Telephone Encounter (Signed)
It will be ok to hold on starting the medication until I see her in June

## 2020-11-21 NOTE — Telephone Encounter (Signed)
Patient has not started metoprolol. It was ordered last week but she has not taken.    Patient states her BP has been low. 106/66, 117/79, 110/64, 111/71, 122/69, 113/65. Heart rate averages 70.   Per patient, it is mainly low in the morning then it increases as the day goes on. She states Dr. Harriet Masson recently put her on Metoprolol 12.5mg  once daily, but she would like to ensure that it is safe to take with low BP.   She has felt like she has a lack of energy. She also reports some dizziness and lightheadeness when bending over and changing positions. Due to all this patient wants to know if Dr. Harriet Masson still recommends she start metoprolol. Will check with Dr. Harriet Masson.

## 2020-12-27 NOTE — Progress Notes (Signed)
Subjective:  Patient ID: Crystal Oneal, female    DOB: 1951/06/12  Age: 70 y.o. MRN: [id number]  Chief Complaint  Patient presents with  . Hypertension    HPI   Patient is 70 year old white female with hypertension and hyperlipidemia who presents for routine follow-up.  She was seen in the emergency department in March 2022 for syncopal episode.  An echocardiogram was done in the emergency department and was normal.  She was set up with a Holter monitor which showed SVT.  She has not had another syncopal episode.  She does at times feel lightheaded.  She was put on metoprolol 48m XL half daily however her blood pressure dropped and she had to discontinue it.  She was also found to have some hyponatremia.  She has not been limiting her salt intake due to this.  She drinks approximately 80 ounces of water daily.  Previously she was on losartan as monotherapy but her blood pressure went up and we had to add the hydrochlorothiazide.  Per the patient her heart rate at home is running in the 70s.  Today gets in the 657s Patient is scheduled to follow up with dr. THarriet Massonin early June.   Gastroesophageal reflux disease without esophagitis Protonix reflux however it causes her to have constipation.  She previously took Pepcid which worked well.  She is very open to returning to this.  Mixed hyperlipidemia Fish oil.  No statins.  Patient eats healthy.  Cholesterol was drawn in March 2022 and it was fairly good.  Her LDL was 107.  Patient has been on fosamax 70 mg once weekly x 3 years.  The Fosamax upsets her stomach.   Current Outpatient Medications on File Prior to Visit  Medication Sig Dispense Refill  . alendronate (FOSAMAX) 70 MG tablet Take 1 tablet (70 mg total) by mouth every Friday. Take with a full glass of water on an empty stomach. 12 tablet 2  . aspirin EC 81 MG tablet Take 81 mg by mouth daily.    .Marland Kitchenb complex vitamins tablet Take 1 tablet by mouth daily.    . calcium citrate-vitamin  D (CITRACAL+D) 315-200 MG-UNIT tablet Take 2 tablets by mouth 2 (two) times daily.    . cetirizine (ZYRTEC) 10 MG tablet Take 5 mg by mouth daily as needed for allergies.    . Cholecalciferol (VITAMIN D) 2000 units tablet Take 2,000 Units by mouth daily.    . fluticasone (FLONASE) 50 MCG/ACT nasal spray Place 1 spray into both nostrils daily as needed for allergies or rhinitis.    . Ginger, Zingiber officinalis, (GINGER ROOT) 550 MG CAPS Take 550 mg by mouth daily.    . hydrochlorothiazide (MICROZIDE) 12.5 MG capsule Take 1 capsule (12.5 mg total) by mouth daily. 90 capsule 3  . losartan (COZAAR) 50 MG tablet Take 1 tablet (50 mg total) by mouth daily. 90 tablet 0  . melatonin 1 MG TABS tablet Take 1 mg by mouth at bedtime.    . Olopatadine HCl (PATADAY OP) Place 1 drop into both eyes daily as needed (allergy).    . Omega-3 Fatty Acids (FISH OIL PO) Take 1 capsule by mouth daily.    . TURMERIC CURCUMIN PO Take 750 mg by mouth daily.    . vitamin C (ASCORBIC ACID) 500 MG tablet Take 500 mg by mouth daily.     No current facility-administered medications on file prior to visit.   Past Medical History:  Diagnosis Date  . Age-related  osteoporosis without current pathological fracture   . Arthritis   . Cancer ([place]) 04/2020   SCC skin cancer removed from nose  . Cervical stenosis of spine    C11-12  . Dysphagia    Esophageal dilation x2  . Essential (primary) hypertension   . GERD (gastroesophageal reflux disease)   . Hypertension   . Mixed hyperlipidemia   . PONV (postoperative nausea and vomiting)   . Spondylolisthesis of lumbar region    Past Surgical History:  Procedure Laterality Date  . ABDOMINAL HYSTERECTOMY    . ANTERIOR AND POSTERIOR REPAIR N/A 03/20/2016   Procedure: REPAIR CYSTOCELE AND RECTOCELE;  Surgeon: Bjorn Loser, MD;  Location: [facility];  Service: Urology;  Laterality: N/A;  . BACK SURGERY  2019   has hardware  . COLONOSCOPY    . ESOPHAGEAL DILATION  2005   x 2   . 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 VAGINAL HYSTERECTOMY Bilateral 03/20/2016   Procedure: TOTAL VAGINAL HYSTERECTOMY WITH BILATERAL SALPINGO OOPHORECTOMY mccall coloplasty;  Surgeon: Servando Salina, MD;  Location: [facility];  Service: Gynecology;  Laterality: Bilateral;  . VAGINAL PROLAPSE REPAIR N/A 03/20/2016   Procedure: REPAIR VAULT PROLAPSE AND GRAFT;  Surgeon: Bjorn Loser, MD;  Location: [facility];  Service: Urology;  Laterality: N/A;    Family History  Problem Relation Age of Onset  . Uterine cancer Mother   . Breast cancer Mother   . Lung disease Father   . Breast cancer Sister   . Multiple myeloma Brother    Social History   Socioeconomic History  . Marital status: Married    Spouse name: Not on file  . Number of children: 2  . Years of education: Not on file  . Highest education level: Not on file  Occupational History  . Not on file  Tobacco Use  . Smoking status: Never Smoker  . Smokeless tobacco: Never Used  Vaping Use  . Vaping Use: Never used  Substance and Sexual Activity  . Alcohol use: Yes    Alcohol/week: 1.0 standard drink    Types: 1 Glasses of wine per week    Comment: rarely  . Drug use: Never  . Sexual activity: Not on file  Other Topics Concern  . Not on file  Social History Narrative  . Not on file   Social Determinants of Health   Financial Resource Strain: Low Risk   . Difficulty of Paying Living Expenses: Not hard at all  Food Insecurity: No Food Insecurity  . Worried About Charity fundraiser in the Last Year: Never true  . Ran Out of Food in the Last Year: Never true  Transportation Needs: No Transportation Needs  . Lack of Transportation (Medical): No  . Lack of Transportation (Non-Medical): No  Physical Activity: Sufficiently Active  . Days of Exercise per Week: 5 days  . Minutes of Exercise per Session: 30 min  Stress: No Stress Concern Present  . Feeling of Stress : Not at all  Social Connections:  Socially Integrated  . Frequency of Communication with Friends and Family: More than three times a week  . Frequency of Social Gatherings with Friends and Family: Twice a week  . Attends Religious Services: More than 4 times per year  . Active Member of Clubs or Organizations: Yes  . Attends Archivist Meetings: More than 4 times per year  . Marital Status: Married    Review of Systems  Constitutional:  Negative for chills, fatigue and fever.  HENT: Negative for congestion, ear pain, rhinorrhea and sore throat.   Respiratory: Negative for cough and shortness of breath.   Cardiovascular: Positive for palpitations. Negative for chest pain.  Gastrointestinal: Positive for constipation. Negative for abdominal pain, diarrhea, nausea and vomiting.  Genitourinary: Negative for dysuria and urgency.  Musculoskeletal: Positive for arthralgias and back pain. Negative for myalgias.  Neurological: Positive for light-headedness. Negative for dizziness, weakness and headaches.  Psychiatric/Behavioral: Negative for dysphoric mood. The patient is not nervous/anxious.      Objective:  Temp (!) 95.9 F (35.5 C)   Resp 14   Ht 5' 1"  (1.549 m)   Wt 115 lb (52.2 kg)   BMI 21.73 kg/m   BP/Weight 12/28/2020 11/14/2020 5/39/7673  Systolic BP - 419 379  Diastolic BP - 70 64  Wt. (Lbs) 115 115.2 -  BMI 21.73 21.77 -    Physical Exam Vitals reviewed.  Constitutional:      Appearance: Normal appearance. She is normal weight.  Neck:     Vascular: No carotid bruit.  Cardiovascular:     Rate and Rhythm: Normal rate and regular rhythm.     Heart sounds: Normal heart sounds.  Pulmonary:     Effort: Pulmonary effort is normal. No respiratory distress.     Breath sounds: Normal breath sounds.  Abdominal:     General: Abdomen is flat. Bowel sounds are normal.     Palpations: Abdomen is soft.     Tenderness: There is no abdominal tenderness.  Neurological:     Mental Status: She is alert and  oriented to person, place, and time.  Psychiatric:        Mood and Affect: Mood normal.        Behavior: Behavior normal.     Diabetic Foot Exam - Simple   No data filed      Lab Results  Component Value Date   WBC 4.7 10/30/2020   HGB 13.6 10/30/2020   HCT 39.9 10/30/2020   PLT 245 10/30/2020   GLUCOSE 103 (H) 10/30/2020   CHOL 182 11/14/2020   TRIG 61 11/14/2020   HDL 63 11/14/2020   LDLCALC 107 (H) 11/14/2020   ALT 22 06/22/2020   AST 31 06/22/2020   NA 130 (L) 10/30/2020   K 3.7 10/30/2020   CL 96 (L) 10/30/2020   CREATININE 0.77 10/30/2020   BUN 12 10/30/2020   CO2 26 10/30/2020   TSH 2.740 10/13/2020   INR 0.95 03/15/2016      Assessment & Plan:   1. Primary hypertension: well controlled. - CBC with Differential/Platelet - Comprehensive metabolic panel 2. Gastroesophageal reflux disease without esophagitis: well controlled.  3. Mixed hyperlipidemia: improved, but not at goal in 11/2020. 4. SVT (supraventricular tachycardia) (HCC) - sporadic. Consider adding metoprolol if recurs and if bp increases after discontinuation of hctz. 5. Syncope, unspecified syncope type: resolved 6. Hyponatremia: stop hctz. 7. Visit for screening mammogram - MM DIGITAL SCREENING BILATERAL  PLAN:  Stop pantoprazole. Start pepcid.  Stop hydrochlorothiazide.  Mammogram ordered for the mobile mammogram unit. If patient has increased heart rate, she may take the metoprolol XL 25 mg half pill daily as needed. Keep follow-up with Dr. Harriet Masson. Consider discontinuation of Fosamax due to GI upset.  I would recommend patient be on Prolia alternatively.  I will give patient information on Prolia for her review.    Meds ordered this encounter  Medications  . famotidine (PEPCID) 40 MG tablet  Sig: Take 1 tablet (40 mg total) by mouth daily.    Dispense:  90 tablet    Refill:  3    Orders Placed This Encounter  Procedures  . MM DIGITAL SCREENING BILATERAL  . CBC with  Differential/Platelet  . Comprehensive metabolic panel     Follow-up: Return in about 4 months (around 04/30/2021) for fsating. Needs AWV. Please set up with [facility].  An After Visit Summary was printed and given to the patient.  Rochel Brome, MD [facility] [phone]

## 2020-12-28 ENCOUNTER — Ambulatory Visit (INDEPENDENT_AMBULATORY_CARE_PROVIDER_SITE_OTHER): Payer: PPO

## 2020-12-28 ENCOUNTER — Ambulatory Visit (INDEPENDENT_AMBULATORY_CARE_PROVIDER_SITE_OTHER): Payer: PPO | Admitting: Family Medicine

## 2020-12-28 ENCOUNTER — Encounter: Payer: Self-pay | Admitting: Family Medicine

## 2020-12-28 ENCOUNTER — Other Ambulatory Visit: Payer: Self-pay

## 2020-12-28 VITALS — Temp 95.9°F | Resp 14 | Ht 61.0 in | Wt 115.0 lb

## 2020-12-28 DIAGNOSIS — K219 Gastro-esophageal reflux disease without esophagitis: Secondary | ICD-10-CM | POA: Diagnosis not present

## 2020-12-28 DIAGNOSIS — E782 Mixed hyperlipidemia: Secondary | ICD-10-CM | POA: Diagnosis not present

## 2020-12-28 DIAGNOSIS — E871 Hypo-osmolality and hyponatremia: Secondary | ICD-10-CM | POA: Diagnosis not present

## 2020-12-28 DIAGNOSIS — I471 Supraventricular tachycardia, unspecified: Secondary | ICD-10-CM

## 2020-12-28 DIAGNOSIS — Z23 Encounter for immunization: Secondary | ICD-10-CM

## 2020-12-28 DIAGNOSIS — Z1231 Encounter for screening mammogram for malignant neoplasm of breast: Secondary | ICD-10-CM | POA: Diagnosis not present

## 2020-12-28 DIAGNOSIS — R55 Syncope and collapse: Secondary | ICD-10-CM

## 2020-12-28 DIAGNOSIS — I10 Essential (primary) hypertension: Secondary | ICD-10-CM | POA: Diagnosis not present

## 2020-12-28 LAB — CBC WITH DIFFERENTIAL/PLATELET
Basophils Absolute: 0 10*3/uL (ref 0.0–0.2)
Basos: 1 %
EOS (ABSOLUTE): 0.3 10*3/uL (ref 0.0–0.4)
Eos: 6 %
Hematocrit: 41.4 % (ref 34.0–46.6)
Hemoglobin: 13.9 g/dL (ref 11.1–15.9)
Immature Grans (Abs): 0 10*3/uL (ref 0.0–0.1)
Immature Granulocytes: 0 %
Lymphocytes Absolute: 1.7 10*3/uL (ref 0.7–3.1)
Lymphs: 38 %
MCH: 30.2 pg (ref 26.6–33.0)
MCHC: 33.6 g/dL (ref 31.5–35.7)
MCV: 90 fL (ref 79–97)
Monocytes Absolute: 0.7 10*3/uL (ref 0.1–0.9)
Monocytes: 15 %
Neutrophils Absolute: 1.7 10*3/uL (ref 1.4–7.0)
Neutrophils: 40 %
Platelets: 239 10*3/uL (ref 150–450)
RBC: 4.61 x10E6/uL (ref 3.77–5.28)
RDW: 12.1 % (ref 11.7–15.4)
WBC: 4.4 10*3/uL (ref 3.4–10.8)

## 2020-12-28 LAB — COMPREHENSIVE METABOLIC PANEL
ALT: 26 IU/L (ref 0–32)
AST: 34 IU/L (ref 0–40)
Albumin/Globulin Ratio: 1.6 (ref 1.2–2.2)
Albumin: 4.5 g/dL (ref 3.8–4.8)
Alkaline Phosphatase: 38 IU/L — ABNORMAL LOW (ref 44–121)
BUN/Creatinine Ratio: 14 (ref 12–28)
BUN: 12 mg/dL (ref 8–27)
Bilirubin Total: 0.6 mg/dL (ref 0.0–1.2)
CO2: 26 mmol/L (ref 20–29)
Calcium: 10.5 mg/dL — ABNORMAL HIGH (ref 8.7–10.3)
Chloride: 100 mmol/L (ref 96–106)
Creatinine, Ser: 0.87 mg/dL (ref 0.57–1.00)
Globulin, Total: 2.9 g/dL (ref 1.5–4.5)
Glucose: 92 mg/dL (ref 65–99)
Potassium: 5 mmol/L (ref 3.5–5.2)
Sodium: 137 mmol/L (ref 134–144)
Total Protein: 7.4 g/dL (ref 6.0–8.5)
eGFR: 72 mL/min/{1.73_m2} (ref >59)

## 2020-12-28 MED ORDER — FAMOTIDINE 40 MG PO TABS: 40.0000 mg | ORAL_TABLET | Freq: Every day | ORAL | 3 refills | Status: DC

## 2020-12-28 NOTE — Patient Instructions (Addendum)
Stop pantoprazole. Start pepcid.  Stop hydrochlorothiazide.  Mammogram ordered for the mobile mammogram unit. If patient has increased heart rate, she may take the metoprolol XL 25 mg half pill daily as needed. Check BP and Pulse daily and keep a log.  Keep follow-up with Dr. Harriet Masson. Consider discontinuation of Fosamax due to GI upset.  I would recommend patient be on Prolia alternatively.  I will give patient information on Prolia for her review.  Denosumab injection What is this medicine? DENOSUMAB (den oh sue mab) slows bone breakdown. Prolia is used to treat osteoporosis in women after menopause and in men, and in people who are taking corticosteroids for 6 months or more. Delton See is used to treat a high calcium level due to cancer and to prevent bone fractures and other bone problems caused by multiple myeloma or cancer bone metastases. Delton See is also used to treat giant cell tumor of the bone. This medicine may be used for other purposes; ask your health care provider or pharmacist if you have questions. COMMON BRAND NAME(S): Prolia, XGEVA What should I tell my health care provider before I take this medicine? They need to know if you have any of these conditions:  dental disease  having surgery or tooth extraction  infection  kidney disease  low levels of calcium or Vitamin D in the blood  malnutrition  on hemodialysis  skin conditions or sensitivity  thyroid or parathyroid disease  an unusual reaction to denosumab, other medicines, foods, dyes, or preservatives  pregnant or trying to get pregnant  breast-feeding How should I use this medicine? This medicine is for injection under the skin. It is given by a health care professional in a hospital or clinic setting. A special MedGuide will be given to you before each treatment. Be sure to read this information carefully each time. For Prolia, talk to your pediatrician regarding the use of this medicine in children. Special  care may be needed. For Delton See, talk to your pediatrician regarding the use of this medicine in children. While this drug may be prescribed for children as young as 13 years for selected conditions, precautions do apply. Overdosage: If you think you have taken too much of this medicine contact a poison control center or emergency room at once. NOTE: This medicine is only for you. Do not share this medicine with others. What if I miss a dose? It is important not to miss your dose. Call your doctor or health care professional if you are unable to keep an appointment. What may interact with this medicine? Do not take this medicine with any of the following medications:  other medicines containing denosumab This medicine may also interact with the following medications:  medicines that lower your chance of fighting infection  steroid medicines like prednisone or cortisone This list may not describe all possible interactions. Give your health care provider a list of all the medicines, herbs, non-prescription drugs, or dietary supplements you use. Also tell them if you smoke, drink alcohol, or use illegal drugs. Some items may interact with your medicine. What should I watch for while using this medicine? Visit your doctor or health care professional for regular checks on your progress. Your doctor or health care professional may order blood tests and other tests to see how you are doing. Call your doctor or health care professional for advice if you get a fever, chills or sore throat, or other symptoms of a cold or flu. Do not treat yourself. This drug may  decrease your body's ability to fight infection. Try to avoid being around people who are sick. You should make sure you get enough calcium and vitamin D while you are taking this medicine, unless your doctor tells you not to. Discuss the foods you eat and the vitamins you take with your health care professional. See your dentist regularly. Brush and  floss your teeth as directed. Before you have any dental work done, tell your dentist you are receiving this medicine. Do not become pregnant while taking this medicine or for 5 months after stopping it. Talk with your doctor or health care professional about your birth control options while taking this medicine. Women should inform their doctor if they wish to become pregnant or think they might be pregnant. There is a potential for serious side effects to an unborn child. Talk to your health care professional or pharmacist for more information. What side effects may I notice from receiving this medicine? Side effects that you should report to your doctor or health care professional as soon as possible:  allergic reactions like skin rash, itching or hives, swelling of the face, lips, or tongue  bone pain  breathing problems  dizziness  jaw pain, especially after dental work  redness, blistering, peeling of the skin  signs and symptoms of infection like fever or chills; cough; sore throat; pain or trouble passing urine  signs of low calcium like fast heartbeat, muscle cramps or muscle pain; pain, tingling, numbness in the hands or feet; seizures  unusual bleeding or bruising  unusually weak or tired Side effects that usually do not require medical attention (report to your doctor or health care professional if they continue or are bothersome):  constipation  diarrhea  headache  joint pain  loss of appetite  muscle pain  runny nose  tiredness  upset stomach This list may not describe all possible side effects. Call your doctor for medical advice about side effects. You may report side effects to FDA at 1-800-FDA-1088. Where should I keep my medicine? This medicine is only given in a clinic, doctor's office, or other health care setting and will not be stored at home. NOTE: This sheet is a summary. It may not cover all possible information. If you have questions about this  medicine, talk to your doctor, pharmacist, or health care provider.  2021 Elsevier/Gold Standard (2017-12-06 16:10:44)

## 2020-12-30 ENCOUNTER — Other Ambulatory Visit: Payer: Self-pay | Admitting: Nurse Practitioner

## 2020-12-30 DIAGNOSIS — K219 Gastro-esophageal reflux disease without esophagitis: Secondary | ICD-10-CM

## 2021-01-02 ENCOUNTER — Other Ambulatory Visit: Payer: Self-pay | Admitting: Nurse Practitioner

## 2021-01-02 DIAGNOSIS — I10 Essential (primary) hypertension: Secondary | ICD-10-CM

## 2021-01-03 MED ORDER — LOSARTAN POTASSIUM 50 MG PO TABS: 50.0000 mg | ORAL_TABLET | Freq: Every day | ORAL | 0 refills | Status: DC

## 2021-01-05 ENCOUNTER — Other Ambulatory Visit: Payer: Self-pay

## 2021-01-11 ENCOUNTER — Ambulatory Visit: Payer: PPO | Admitting: Cardiology

## 2021-01-11 ENCOUNTER — Other Ambulatory Visit: Payer: Self-pay

## 2021-01-11 ENCOUNTER — Encounter: Payer: Self-pay | Admitting: Cardiology

## 2021-01-11 VITALS — BP 158/88 | HR 73 | Ht 61.0 in | Wt 117.0 lb

## 2021-01-11 DIAGNOSIS — I471 Supraventricular tachycardia: Secondary | ICD-10-CM

## 2021-01-11 DIAGNOSIS — I10 Essential (primary) hypertension: Secondary | ICD-10-CM | POA: Diagnosis not present

## 2021-01-11 NOTE — Patient Instructions (Addendum)
Medication Instructions:  Your physician recommends that you continue on your current medications as directed. Please refer to the Current Medication list given to you today.   *If you need a refill on your cardiac medications before your next appointment, please call your pharmacy*   Lab Work: None If you have labs (blood work) drawn today and your tests are completely normal, you will receive your results only by: Marland Kitchen MyChart Message (if you have MyChart) OR . A paper copy in the mail If you have any lab test that is abnormal or we need to change your treatment, we will call you to review the results.   Testing/Procedures: None   Follow-Up: At [facility], you and your health needs are our priority.  As part of our continuing mission to provide you with exceptional heart care, we have created designated Provider Care Teams.  These Care Teams include your primary Cardiologist (physician) and Advanced Practice Providers (APPs -  Physician Assistants and Nurse Practitioners) who all work together to provide you with the care you need, when you need it.  We recommend signing up for the patient portal called "MyChart".  Sign up information is provided on this After Visit Summary.  MyChart is used to connect with patients for Virtual Visits (Telemedicine).  Patients are able to view lab/test results, encounter notes, upcoming appointments, etc.  Non-urgent messages can be sent to your provider as well.   To learn more about what you can do with MyChart, go to [email or web address].    Your next appointment:   4-6 weeks  The format for your next appointment:   In Person  Provider:   Berniece Salines, DO   Other Instructions

## 2021-01-11 NOTE — Progress Notes (Signed)
Cardiology Office Note:    Date:  01/11/2021   ID:  [name], DOB [birth year], MRN [id number]  PCP:  Rochel Brome, MD  Cardiologist:  Berniece Salines, DO  Electrophysiologist:  None   Referring MD: Rochel Brome, MD   I am doing a lot better  History of Present Illness:    Crystal Oneal is a 70 y.o. female with a hx of hypertension, hyperlipidemia is here today for follow-up visit.  Did see the patient April 2022 at that time we discussed her ZIO monitor results which show paroxysmal SVT.  We also talked about her coronary CTA as well as her echocardiogram.  For paroxysmal SVT we started patient on low-dose Toprol-XL 12.5 mg daily.  We continued her on her losartan as well as her hydrochlorothiazide.  Since her follow-up she has reported low blood pressures her hydrochlorothiazide was stopped.  She has a blood pressure reading with her today which is averaging between the 063K systolic in the 160F systolics. No lightheadedness no dizziness.  Past Medical History:  Diagnosis Date  . Age-related osteoporosis without current pathological fracture   . Arthritis   . Cancer ([place]) 04/2020   SCC skin cancer removed from nose  . Cervical stenosis of spine    C11-12  . Dysphagia    Esophageal dilation x2  . Essential (primary) hypertension   . GERD (gastroesophageal reflux disease)   . Hypertension   . Mixed hyperlipidemia   . PONV (postoperative nausea and vomiting)   . Spondylolisthesis of lumbar region     Past Surgical History:  Procedure Laterality Date  . ABDOMINAL HYSTERECTOMY    . ANTERIOR AND POSTERIOR REPAIR N/A 03/20/2016   Procedure: REPAIR CYSTOCELE AND RECTOCELE;  Surgeon: Bjorn Loser, MD;  Location: [facility];  Service: Urology;  Laterality: N/A;  . BACK SURGERY  2019   has hardware  . COLONOSCOPY    . ESOPHAGEAL DILATION  2005   x 2  . 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 VAGINAL HYSTERECTOMY Bilateral 03/20/2016   Procedure: TOTAL  VAGINAL HYSTERECTOMY WITH BILATERAL SALPINGO OOPHORECTOMY mccall coloplasty;  Surgeon: Servando Salina, MD;  Location: [facility];  Service: Gynecology;  Laterality: Bilateral;  . VAGINAL PROLAPSE REPAIR N/A 03/20/2016   Procedure: REPAIR VAULT PROLAPSE AND GRAFT;  Surgeon: Bjorn Loser, MD;  Location: [facility];  Service: Urology;  Laterality: N/A;    Current Medications: Current Meds  Medication Sig  . alendronate (FOSAMAX) 70 MG tablet Take 1 tablet (70 mg total) by mouth every Friday. Take with a full glass of water on an empty stomach.  Marland Kitchen aspirin EC 81 MG tablet Take 81 mg by mouth daily.  Marland Kitchen b complex vitamins tablet Take 1 tablet by mouth daily.  . calcium citrate-vitamin D (CITRACAL+D) 315-200 MG-UNIT tablet Take 2 tablets by mouth 2 (two) times daily.  . cetirizine (ZYRTEC) 10 MG tablet Take 5 mg by mouth daily as needed for allergies.  . Cholecalciferol (VITAMIN D) 2000 units tablet Take 2,000 Units by mouth daily.  . famotidine (PEPCID) 40 MG tablet Take 1 tablet (40 mg total) by mouth daily.  . fluticasone (FLONASE) 50 MCG/ACT nasal spray Place 1 spray into both nostrils daily as needed for allergies or rhinitis.  . Ginger, Zingiber officinalis, (GINGER ROOT) 550 MG CAPS Take 550 mg by mouth daily.  Marland Kitchen losartan (COZAAR) 50 MG tablet Take 1 tablet (50 mg total) by mouth daily.  . melatonin 1 MG  TABS tablet Take 1 mg by mouth at bedtime.  . Olopatadine HCl (PATADAY OP) Place 1 drop into both eyes daily as needed (allergy).  . Omega-3 Fatty Acids (FISH OIL PO) Take 1 capsule by mouth daily.  . pantoprazole (PROTONIX) 40 MG tablet Take 1 tablet by mouth daily.  . TURMERIC CURCUMIN PO Take 750 mg by mouth daily.  . vitamin C (ASCORBIC ACID) 500 MG tablet Take 500 mg by mouth daily.  . [DISCONTINUED] hydrochlorothiazide (MICROZIDE) 12.5 MG capsule Take 1 capsule (12.5 mg total) by mouth daily.     Allergies:   Pneumovax 23 [pneumococcal vac polyvalent], Lisinopril, and Sulfa antibiotics    Social History   Socioeconomic History  . Marital status: Married    Spouse name: Not on file  . Number of children: 2  . Years of education: Not on file  . Highest education level: Not on file  Occupational History  . Not on file  Tobacco Use  . Smoking status: Never Smoker  . Smokeless tobacco: Never Used  Vaping Use  . Vaping Use: Never used  Substance and Sexual Activity  . Alcohol use: Yes    Alcohol/week: 1.0 standard drink    Types: 1 Glasses of wine per week    Comment: rarely  . Drug use: Never  . Sexual activity: Not on file  Other Topics Concern  . Not on file  Social History Narrative  . Not on file   Social Determinants of Health   Financial Resource Strain: Low Risk   . Difficulty of Paying Living Expenses: Not hard at all  Food Insecurity: No Food Insecurity  . Worried About Charity fundraiser in the Last Year: Never true  . Ran Out of Food in the Last Year: Never true  Transportation Needs: No Transportation Needs  . Lack of Transportation (Medical): No  . Lack of Transportation (Non-Medical): No  Physical Activity: Sufficiently Active  . Days of Exercise per Week: 5 days  . Minutes of Exercise per Session: 30 min  Stress: No Stress Concern Present  . Feeling of Stress : Not at all  Social Connections: Socially Integrated  . Frequency of Communication with Friends and Family: More than three times a week  . Frequency of Social Gatherings with Friends and Family: Twice a week  . Attends Religious Services: More than 4 times per year  . Active Member of Clubs or Organizations: Yes  . Attends Archivist Meetings: More than 4 times per year  . Marital Status: Married     Family History: The patient's family history includes Breast cancer in her mother and sister; Lung disease in her father; Multiple myeloma in her brother; Uterine cancer in her mother.  ROS:   Review of Systems  Constitution: Negative for decreased appetite, fever and  weight gain.  HENT: Negative for congestion, ear discharge, hoarse voice and sore throat.   Eyes: Negative for discharge, redness, vision loss in right eye and visual halos.  Cardiovascular: Negative for chest pain, dyspnea on exertion, leg swelling, orthopnea and palpitations.  Respiratory: Negative for cough, hemoptysis, shortness of breath and snoring.   Endocrine: Negative for heat intolerance and polyphagia.  Hematologic/Lymphatic: Negative for bleeding problem. Does not bruise/bleed easily.  Skin: Negative for flushing, nail changes, rash and suspicious lesions.  Musculoskeletal: Negative for arthritis, joint pain, muscle cramps, myalgias, neck pain and stiffness.  Gastrointestinal: Negative for abdominal pain, bowel incontinence, diarrhea and excessive appetite.  Genitourinary: Negative for decreased libido,  genital sores and incomplete emptying.  Neurological: Negative for brief paralysis, focal weakness, headaches and loss of balance.  Psychiatric/Behavioral: Negative for altered mental status, depression and suicidal ideas.  Allergic/Immunologic: Negative for HIV exposure and persistent infections.    EKGs/Labs/Other Studies Reviewed:    The following studies were reviewed today:   EKG:  The ekg ordered today demonstrates   Zio monitor  Patch Wear Time: 6 days and 0 hours (2022-03-03T11:48:50-0500 to 2022-03-09T12:08:55-0500)  Patient had a min HR of 53 bpm, max HR of 158 bpm, and avg HR of 77 bpm. Predominant underlying rhythm was Sinus Rhythm. First Degree AV Block was present. 2 Supraventricular Tachycardia runs occurred, the run with the fastest interval lasting 5 beats  with a max rate of 158 bpm, the longest lasting 6 beats with an avg rate of 105 bpm. Isolated SVEs were rare (<1.0%), SVE Triplets were rare (<1.0%), and no SVE Couplets were present. Isolated VEs were rare (<1.0%), and no VE Couplets or VE Triplets were present.  There were no pauses of 3 seconds or  greater and no episodes of second or third-degree AV nodal block or sinus node exit block. There were 6 triggered and 6 diary events all sinus rhythm. Ventricular ectopy was rare. Supraventricular ectopy was rare.  Conclusion unremarkable event monitor.   TTE 10/30/20  IMPRESSIONS  1. Left ventricular ejection fraction, by estimation, is 60 to 65%. The left ventricle has normal function. The left ventricle has no regional wall motion abnormalities. There is mild concentric left ventricular hypertrophy. Left ventricular diastolic parameters are consistent with Grade I diastolic dysfunction (impaired relaxation).  2. Right ventricular systolic function is normal. The right ventricular size is normal. Tricuspid regurgitation signal is inadequate for assessing PA pressure.  3. The mitral valve is normal in structure. Trivial mitral valve regurgitation. No evidence of mitral stenosis.  4. The aortic valve was not well visualized. Aortic valve regurgitation is not visualized. No aortic stenosis is present. Aortic valve area, by VTI measures 2.60 cm. Aortic valve mean gradient measures 3.0 mmHg. Aortic valve Vmax measures 1.01 m/s.  5. The inferior vena cava is normal in size with greater than 50% respiratory variability, suggesting right atrial pressure of 3 mmHg.   FINDINGS  Left Ventricle: Left ventricular ejection fraction, by estimation, is 60  to 65%. The left ventricle has normal function. The left ventricle has no  regional wall motion abnormalities. The left ventricular internal cavity  size was normal in size. There is  mild concentric left ventricular hypertrophy. Left ventricular diastolic  parameters are consistent with Grade I diastolic dysfunction (impaired  relaxation). Normal left ventricular filling pressure.   Right Ventricle: The right ventricular size is normal. No increase in  right ventricular wall thickness. Right ventricular systolic function is  normal.  Tricuspid regurgitation signal is inadequate for assessing PA  pressure.   Left Atrium: Left atrial size was normal in size.   Right Atrium: Right atrial size was normal in size.   Pericardium: There is no evidence of pericardial effusion.   Mitral Valve: The mitral valve is normal in structure. Trivial mitral  valve regurgitation. No evidence of mitral valve stenosis. MV peak  gradient, 5.6 mmHg. The mean mitral valve gradient is 2.0 mmHg.   Tricuspid Valve: The tricuspid valve is normal in structure. Tricuspid  valve regurgitation is trivial. No evidence of tricuspid stenosis.   Aortic Valve: The aortic valve was not well visualized. Aortic valve  regurgitation is not visualized. No aortic  stenosis is present. Aortic  valve mean gradient measures 3.0 mmHg. Aortic valve peak gradient measures  4.1 mmHg. Aortic valve area, by VTI  measures 2.60 cm.   Pulmonic Valve: The pulmonic valve was normal in structure. Pulmonic valve  regurgitation is not visualized. No evidence of pulmonic stenosis.   Aorta: The aortic root is normal in size and structure.   Venous: The inferior vena cava is normal in size with greater than 50%  respiratory variability, suggesting right atrial pressure of 3 mmHg.   IAS/Shunts: No atrial level shunt detected by color flow Doppler.   Recent Labs: 10/13/2020: TSH 2.740 12/28/2020: ALT 26; BUN 12; Creatinine, Ser 0.87; Hemoglobin 13.9; Platelets 239; Potassium 5.0; Sodium 137  Recent Lipid Panel    Component Value Date/Time   CHOL 182 11/14/2020 1007   TRIG 61 11/14/2020 1007   HDL 63 11/14/2020 1007   CHOLHDL 2.9 11/14/2020 1007   LDLCALC 107 (H) 11/14/2020 1007    Physical Exam:    VS:  BP (!) 158/88 (BP Location: Left Arm)   Pulse 73   Ht _0  (1.549 m)   Wt 117 lb (53.1 kg)   SpO2 98%   BMI 22.11 kg/m     Wt Readings from Last 3 Encounters:  01/11/21 117 lb (53.1 kg)  12/28/20 115 lb (52.2 kg)  11/14/20 115 lb 3.2 oz (52.3 kg)      GEN: Well nourished, well developed in no acute distress HEENT: Normal NECK: No JVD; No carotid bruits LYMPHATICS: No lymphadenopathy CARDIAC: S1S2 noted,RRR, no murmurs, rubs, gallops RESPIRATORY:  Clear to auscultation without rales, wheezing or rhonchi  ABDOMEN: Soft, non-tender, non-distended, +bowel sounds, no guarding. EXTREMITIES: No edema, No cyanosis, no clubbing MUSCULOSKELETAL:  No deformity  SKIN: Warm and dry NEUROLOGIC:  Alert and oriented x 3, non-focal PSYCHIATRIC:  Normal affect, good insight  ASSESSMENT:    1. Essential (primary) hypertension   2. PSVT (paroxysmal supraventricular tachycardia) (HCC)    PLAN:     Her blood pressure which was manually taken by me in the office today right arm 160/92 mmHg, left arm 158/88 mmHg.  On review of her home blood pressure readings she is averaging in the 458K to 998P systolic.  There is a discrepancy.  I would rather hold on increasing blood pressure medication for now to avoid hypotension.  I have asked the patient to take her blood pressure daily I will follow-up with the patient in 2 to 3 weeks at which time she is going to bring her automated blood pressure cuff.  I am suspecting that some level of whitecoat hypertension may be playing a role here.   The patient is in agreement with the above plan. The patient left the office in stable condition.  The patient will follow up in   Medication Adjustments/Labs and Tests Ordered: Current medicines are reviewed at length with the patient today.  Concerns regarding medicines are outlined above.  No orders of the defined types were placed in this encounter.  No orders of the defined types were placed in this encounter.   Patient Instructions  Medication Instructions:  Your physician recommends that you continue on your current medications as directed. Please refer to the Current Medication list given to you today.   *If you need a refill on your cardiac medications  before your next appointment, please call your pharmacy*   Lab Work: None If you have labs (blood work) drawn today and your tests are completely normal, you  will receive your results only by: Marland Kitchen MyChart Message (if you have MyChart) OR . A paper copy in the mail If you have any lab test that is abnormal or we need to change your treatment, we will call you to review the results.   Testing/Procedures: None   Follow-Up: At [facility], you and your health needs are our priority.  As part of our continuing mission to provide you with exceptional heart care, we have created designated Provider Care Teams.  These Care Teams include your primary Cardiologist (physician) and Advanced Practice Providers (APPs -  Physician Assistants and Nurse Practitioners) who all work together to provide you with the care you need, when you need it.  We recommend signing up for the patient portal called "MyChart".  Sign up information is provided on this After Visit Summary.  MyChart is used to connect with patients for Virtual Visits (Telemedicine).  Patients are able to view lab/test results, encounter notes, upcoming appointments, etc.  Non-urgent messages can be sent to your provider as well.   To learn more about what you can do with MyChart, go to [email or web address].    Your next appointment:   4-6 weeks  The format for your next appointment:   In Person  Provider:   Berniece Salines, DO   Other Instructions      Adopting a Healthy Lifestyle.  Know what a healthy weight is for you (roughly BMI <25) and aim to maintain this   Aim for 7+ servings of fruits and vegetables daily   65-80+ fluid ounces of water or unsweet tea for healthy kidneys   Limit to max 1 drink of alcohol per day; avoid smoking/tobacco   Limit animal fats in diet for cholesterol and heart health - choose grass fed whenever available   Avoid highly processed foods, and foods high in saturated/trans fats   Aim for  low stress - take time to unwind and care for your mental health   Aim for 150 min of moderate intensity exercise weekly for heart health, and weights twice weekly for bone health   Aim for 7-9 hours of sleep daily   When it comes to diets, agreement about the perfect plan isnt easy to find, even among the experts. Experts at the [employer] developed an idea known as the Healthy Eating Plate. Just imagine a plate divided into logical, healthy portions.   The emphasis is on diet quality:   Load up on vegetables and fruits - one-half of your plate: Aim for color and variety, and remember that potatoes dont count.   Go for whole grains - one-quarter of your plate: Whole wheat, barley, wheat berries, quinoa, oats, brown rice, and foods made with them. If you want pasta, go with whole wheat pasta.   Protein power - one-quarter of your plate: Fish, chicken, beans, and nuts are all healthy, versatile protein sources. Limit red meat.   The diet, however, does go beyond the plate, offering a few other suggestions.   Use healthy plant oils, such as olive, canola, soy, corn, sunflower and peanut. Check the labels, and avoid partially hydrogenated oil, which have unhealthy trans fats.   If youre thirsty, drink water. Coffee and tea are good in moderation, but skip sugary drinks and limit milk and dairy products to one or two daily servings.   The type of carbohydrate in the diet is more important than the amount. Some sources of carbohydrates, such as vegetables, fruits, whole grains,  and beans-are healthier than others.   Finally, stay active  Signed, Berniece Salines, DO  01/11/2021 11:39 AM    [place]

## 2021-01-16 ENCOUNTER — Telehealth: Payer: Self-pay

## 2021-01-16 DIAGNOSIS — Z006 Encounter for examination for normal comparison and control in clinical research program: Secondary | ICD-10-CM

## 2021-01-16 NOTE — Telephone Encounter (Signed)
Called patient for 90 day Identify phone call no answer, I left a voicemail stating the intent of the phone call and our call back number to be reached in our department. 

## 2021-02-06 ENCOUNTER — Ambulatory Visit
Admission: RE | Admit: 2021-02-06 | Discharge: 2021-02-06 | Disposition: A | Discharge status: Home / Self Care | Payer: PPO | Source: Ambulatory Visit | Attending: Family Medicine | Admitting: Family Medicine

## 2021-02-06 ENCOUNTER — Other Ambulatory Visit: Payer: Self-pay

## 2021-02-06 DIAGNOSIS — Z1231 Encounter for screening mammogram for malignant neoplasm of breast: Secondary | ICD-10-CM | POA: Diagnosis not present

## 2021-02-14 ENCOUNTER — Ambulatory Visit: Payer: PPO | Admitting: Cardiology

## 2021-02-14 ENCOUNTER — Other Ambulatory Visit: Payer: Self-pay

## 2021-02-14 ENCOUNTER — Encounter: Payer: Self-pay | Admitting: Cardiology

## 2021-02-14 VITALS — BP 154/70 | HR 65 | Ht 61.0 in | Wt 115.0 lb

## 2021-02-14 DIAGNOSIS — I471 Supraventricular tachycardia: Secondary | ICD-10-CM | POA: Diagnosis not present

## 2021-02-14 DIAGNOSIS — I10 Essential (primary) hypertension: Secondary | ICD-10-CM | POA: Insufficient documentation

## 2021-02-14 NOTE — Progress Notes (Signed)
Cardiology Office Note:    Date:  02/14/2021   ID:  Crystal Oneal, DOB 10-15-1950, MRN [id number]  PCP:  Rochel Brome, MD  Cardiologist:  Berniece Salines, DO  Electrophysiologist:  None   Referring MD: Rochel Brome, MD   No chief complaint on file. I am doing fine  History of Present Illness:    Crystal Oneal is a 70 y.o. female with a hx of hypertension, hyperlipidemia, paroxysmal SVT is here today for follow-up visit.  Did see the patient April 2022 at that time we discussed her ZIO monitor results which show paroxysmal SVT.  We also talked about her coronary CTA as well as her echocardiogram.  For paroxysmal SVT we started patient on low-dose Toprol-XL 12.5 mg daily.  We continued her on her losartan as well as her hydrochlorothiazide.  At her last visit on January 11, 2021 the patient reported that at home her blood pressure has been running in the 623J systolic to the 628B.  Because I did asked the patient to take her blood pressure daily and did not change medication despite systolic blood pressure in the 150s here.  She is here today and did bring her blood pressure readings from home for the month of June and I am able to physically review this which showed that her blood pressure averages 112-120 millimeters mercury systolic.  Diastolic averages 60 to 70 mmHg.  Past Medical History:  Diagnosis Date   Age-related osteoporosis without current pathological fracture    Arthritis    Cancer ([place]) 04/2020   SCC skin cancer removed from nose   Cervical stenosis of spine    C11-12   Dysphagia    Esophageal dilation x2   Essential (primary) hypertension    GERD (gastroesophageal reflux disease)    Hypertension    Mixed hyperlipidemia    PONV (postoperative nausea and vomiting)    Spondylolisthesis of lumbar region     Past Surgical History:  Procedure Laterality Date   ABDOMINAL HYSTERECTOMY     ANTERIOR AND POSTERIOR REPAIR N/A 03/20/2016   Procedure: REPAIR CYSTOCELE AND  RECTOCELE;  Surgeon: Bjorn Loser, MD;  Location: [facility];  Service: Urology;  Laterality: N/A;   BACK SURGERY  2019   has hardware   COLONOSCOPY     ESOPHAGEAL DILATION  2005   x 2   MOHS SURGERY  04/2020   SCC skin cancer removed from nose   VAGINAL HYSTERECTOMY Bilateral 03/20/2016   Procedure: TOTAL VAGINAL HYSTERECTOMY WITH BILATERAL SALPINGO OOPHORECTOMY mccall coloplasty;  Surgeon: Servando Salina, MD;  Location: [facility];  Service: Gynecology;  Laterality: Bilateral;   VAGINAL PROLAPSE REPAIR N/A 03/20/2016   Procedure: REPAIR VAULT PROLAPSE AND GRAFT;  Surgeon: Bjorn Loser, MD;  Location: [facility];  Service: Urology;  Laterality: N/A;    Current Medications: Current Meds  Medication Sig   alendronate (FOSAMAX) 70 MG tablet Take 1 tablet (70 mg total) by mouth every Friday. Take with a full glass of water on an empty stomach.   aspirin EC 81 MG tablet Take 81 mg by mouth daily.   b complex vitamins tablet Take 1 tablet by mouth daily.   calcium citrate-vitamin D (CITRACAL+D) 315-200 MG-UNIT tablet Take 2 tablets by mouth 2 (two) times daily.   cetirizine (ZYRTEC) 10 MG tablet Take 5 mg by mouth daily as needed for allergies.   Cholecalciferol (VITAMIN D) 2000 units tablet Take 2,000 Units by mouth daily.   famotidine (PEPCID) 40 MG tablet Take 1  tablet (40 mg total) by mouth daily.   fluticasone (FLONASE) 50 MCG/ACT nasal spray Place 1 spray into both nostrils daily as needed for allergies or rhinitis.   Ginger, Zingiber officinalis, (GINGER ROOT) 550 MG CAPS Take 550 mg by mouth daily.   losartan (COZAAR) 50 MG tablet Take 1 tablet (50 mg total) by mouth daily.   melatonin 1 MG TABS tablet Take 1 mg by mouth at bedtime.   metoprolol succinate (TOPROL-XL) 25 MG 24 hr tablet Take 12.5 mg by mouth daily.   Olopatadine HCl (PATADAY OP) Place 1 drop into both eyes daily as needed (allergy).   Omega-3 Fatty Acids (FISH OIL PO) Take 1 capsule by mouth daily.   TURMERIC CURCUMIN PO  Take 750 mg by mouth daily.   vitamin C (ASCORBIC ACID) 500 MG tablet Take 500 mg by mouth daily.   [DISCONTINUED] pantoprazole (PROTONIX) 40 MG tablet Take 1 tablet by mouth daily.     Allergies:   Pneumovax 23 [pneumococcal vac polyvalent], Lisinopril, and Sulfa antibiotics   Social History   Socioeconomic History   Marital status: Married    Spouse name: Not on file   Number of children: 2   Years of education: Not on file   Highest education level: Not on file  Occupational History   Not on file  Tobacco Use   Smoking status: Never   Smokeless tobacco: Never  Vaping Use   Vaping Use: Never used  Substance and Sexual Activity   Alcohol use: Yes    Alcohol/week: 1.0 standard drink    Types: 1 Glasses of wine per week    Comment: rarely   Drug use: Never   Sexual activity: Not on file  Other Topics Concern   Not on file  Social History Narrative   Not on file   Social Determinants of Health   Financial Resource Strain: Low Risk    Difficulty of Paying Living Expenses: Not hard at all  Food Insecurity: No Food Insecurity   Worried About Charity fundraiser in the Last Year: Never true   [place] in the Last Year: Never true  Transportation Needs: No Transportation Needs   Lack of Transportation (Medical): No   Lack of Transportation (Non-Medical): No  Physical Activity: Sufficiently Active   Days of Exercise per Week: 5 days   Minutes of Exercise per Session: 30 min  Stress: No Stress Concern Present   Feeling of Stress : Not at all  Social Connections: Socially Integrated   Frequency of Communication with Friends and Family: More than three times a week   Frequency of Social Gatherings with Friends and Family: Twice a week   Attends Religious Services: More than 4 times per year   Active Member of [employer] or Organizations: Yes   Attends Music therapist: More than 4 times per year   Marital Status: Married     Family History: The patient's  family history includes Breast cancer in her mother and sister; Lung disease in her father; Multiple myeloma in her brother; Uterine cancer in her mother.  ROS:   Review of Systems  Constitution: Negative for decreased appetite, fever and weight gain.  HENT: Negative for congestion, ear discharge, hoarse voice and sore throat.   Eyes: Negative for discharge, redness, vision loss in right eye and visual halos.  Cardiovascular: Negative for chest pain, dyspnea on exertion, leg swelling, orthopnea and palpitations.  Respiratory: Negative for cough, hemoptysis, shortness of breath and  snoring.   Endocrine: Negative for heat intolerance and polyphagia.  Hematologic/Lymphatic: Negative for bleeding problem. Does not bruise/bleed easily.  Skin: Negative for flushing, nail changes, rash and suspicious lesions.  Musculoskeletal: Negative for arthritis, joint pain, muscle cramps, myalgias, neck pain and stiffness.  Gastrointestinal: Negative for abdominal pain, bowel incontinence, diarrhea and excessive appetite.  Genitourinary: Negative for decreased libido, genital sores and incomplete emptying.  Neurological: Negative for brief paralysis, focal weakness, headaches and loss of balance.  Psychiatric/Behavioral: Negative for altered mental status, depression and suicidal ideas.  Allergic/Immunologic: Negative for HIV exposure and persistent infections.    EKGs/Labs/Other Studies Reviewed:    The following studies were reviewed today:   EKG: None today   Zio monitor  Patch Wear Time:  6 days and 0 hours (2022-03-03T11:48:50-0500 to 2022-03-09T12:08:55-0500)   Patient had a min HR of 53 bpm, max HR of 158 bpm, and avg HR of 77 bpm. Predominant underlying rhythm was Sinus Rhythm. First Degree AV Block was present. 2 Supraventricular Tachycardia runs occurred, the run with the fastest interval lasting 5 beats with a max rate of 158 bpm, the longest lasting 6 beats with an avg rate of 105 bpm.  Isolated SVEs were rare (<1.0%), SVE Triplets were rare (<1.0%), and no SVE Couplets were present. Isolated VEs were rare (<1.0%), and no VE Couplets or VE Triplets were  present.   There were no pauses of 3 seconds or greater and no episodes of second or third-degree AV nodal block or sinus node exit block. There were 6 triggered and 6 diary events all sinus rhythm. Ventricular ectopy was rare. Supraventricular ectopy was rare.   Conclusion unremarkable event monitor.     TTE 10/30/20  IMPRESSIONS   1. Left ventricular ejection fraction, by estimation, is 60 to 65%. The left ventricle has normal function. The left ventricle has no regional wall motion abnormalities. There is mild concentric left ventricular hypertrophy. Left ventricular diastolic parameters are consistent with Grade I diastolic dysfunction (impaired relaxation).   2. Right ventricular systolic function is normal. The right ventricular size is normal. Tricuspid regurgitation signal is inadequate for assessing PA pressure.   3. The mitral valve is normal in structure. Trivial mitral valve regurgitation. No evidence of mitral stenosis.   4. The aortic valve was not well visualized. Aortic valve regurgitation is not visualized. No aortic stenosis is present. Aortic valve area, by VTI measures 2.60 cm. Aortic valve mean gradient measures 3.0 mmHg. Aortic valve Vmax measures 1.01 m/s.   5. The inferior vena cava is normal in size with greater than 50% respiratory variability, suggesting right atrial pressure of 3 mmHg.   FINDINGS   Left Ventricle: Left ventricular ejection fraction, by estimation, is 60  to 65%. The left ventricle has normal function. The left ventricle has no  regional wall motion abnormalities. The left ventricular internal cavity  size was normal in size. There is   mild concentric left ventricular hypertrophy. Left ventricular diastolic  parameters are consistent with Grade I diastolic dysfunction (impaired   relaxation). Normal left ventricular filling pressure.   Right Ventricle: The right ventricular size is normal. No increase in  right ventricular wall thickness. Right ventricular systolic function is  normal. Tricuspid regurgitation signal is inadequate for assessing PA  pressure.   Left Atrium: Left atrial size was normal in size.   Right Atrium: Right atrial size was normal in size.   Pericardium: There is no evidence of pericardial effusion.   Mitral Valve: The mitral  valve is normal in structure. Trivial mitral  valve regurgitation. No evidence of mitral valve stenosis. MV peak  gradient, 5.6 mmHg. The mean mitral valve gradient is 2.0 mmHg.   Tricuspid Valve: The tricuspid valve is normal in structure. Tricuspid  valve regurgitation is trivial. No evidence of tricuspid stenosis.   Aortic Valve: The aortic valve was not well visualized. Aortic valve  regurgitation is not visualized. No aortic stenosis is present. Aortic  valve mean gradient measures 3.0 mmHg. Aortic valve peak gradient measures  4.1 mmHg. Aortic valve area, by VTI  measures 2.60 cm.   Pulmonic Valve: The pulmonic valve was normal in structure. Pulmonic valve  regurgitation is not visualized. No evidence of pulmonic stenosis.   Aorta: The aortic root is normal in size and structure.   Venous: The inferior vena cava is normal in size with greater than 50%  respiratory variability, suggesting right atrial pressure of 3 mmHg.   IAS/Shunts: No atrial level shunt detected by color flow Doppler.   Recent Labs: 10/13/2020: TSH 2.740 12/28/2020: ALT 26; BUN 12; Creatinine, Ser 0.87; Hemoglobin 13.9; Platelets 239; Potassium 5.0; Sodium 137  Recent Lipid Panel    Component Value Date/Time   CHOL 182 11/14/2020 1007   TRIG 61 11/14/2020 1007   HDL 63 11/14/2020 1007   CHOLHDL 2.9 11/14/2020 1007   LDLCALC 107 (H) 11/14/2020 1007    Physical Exam:    VS:  BP (!) 154/70   Pulse 65   Ht 5' 1"  (1.549 m)    Wt 115 lb (52.2 kg)   SpO2 98%   BMI 21.73 kg/m     Wt Readings from Last 3 Encounters:  02/14/21 115 lb (52.2 kg)  01/11/21 117 lb (53.1 kg)  12/28/20 115 lb (52.2 kg)     GEN: Well nourished, well developed in no acute distress HEENT: Normal NECK: No JVD; No carotid bruits LYMPHATICS: No lymphadenopathy CARDIAC: S1S2 noted,RRR, no murmurs, rubs, gallops RESPIRATORY:  Clear to auscultation without rales, wheezing or rhonchi  ABDOMEN: Soft, non-tender, non-distended, +bowel sounds, no guarding. EXTREMITIES: No edema, No cyanosis, no clubbing MUSCULOSKELETAL:  No deformity  SKIN: Warm and dry NEUROLOGIC:  Alert and oriented x 3, non-focal PSYCHIATRIC:  Normal affect, good insight  ASSESSMENT:    1. Hypertension, unspecified type   2. White coat syndrome with diagnosis of hypertension   3. PSVT (paroxysmal supraventricular tachycardia) ([place])    PLAN:     1.  I did check Blood pressure manually and then with her home blood pressure cuff which did align.  I am suspecting that whitecoat hypertension may be playing a role with this patient.  As her home blood pressure is within normal limits.  With this no changes will be made to her current antihypertensive medication regimen.  She will continue to monitor her blood pressure.  2.  Symptoms have improved for her paroxysmal SVT since being on her beta-blocker.   The patient is in agreement with the above plan. The patient left the office in stable condition.  The patient will follow up in 1 year.   Medication Adjustments/Labs and Tests Ordered: Current medicines are reviewed at length with the patient today.  Concerns regarding medicines are outlined above.  No orders of the defined types were placed in this encounter.  No orders of the defined types were placed in this encounter.   Patient Instructions  Medication Instructions:  Your physician recommends that you continue on your current medications as directed. Please  refer to the Current Medication list given to you today.  *If you need a refill on your cardiac medications before your next appointment, please call your pharmacy*   Lab Work: None ordered   If you have labs (blood work) drawn today and your tests are completely normal, you will receive your results only by: [place] (if you have MyChart) OR A paper copy in the mail If you have any lab test that is abnormal or we need to change your treatment, we will call you to review the results.   Testing/Procedures: None ordered    Follow-Up: At [facility], you and your health needs are our priority.  As part of our continuing mission to provide you with exceptional heart care, we have created designated Provider Care Teams.  These Care Teams include your primary Cardiologist (physician) and Advanced Practice Providers (APPs -  Physician Assistants and Nurse Practitioners) who all work together to provide you with the care you need, when you need it.  We recommend signing up for the patient portal called "MyChart".  Sign up information is provided on this After Visit Summary.  MyChart is used to connect with patients for Virtual Visits (Telemedicine).  Patients are able to view lab/test results, encounter notes, upcoming appointments, etc.  Non-urgent messages can be sent to your provider as well.   To learn more about what you can do with MyChart, go to [email or web address].    Your next appointment:   12 month(s) at the [facility] location   The format for your next appointment:   In Person  Provider:   Dr. Berniece Salines   Other Instructions If you need to be seen sooner, call our office or send a mychart message     Adopting a Healthy Lifestyle.  Know what a healthy weight is for you (roughly BMI <25) and aim to maintain this   Aim for 7+ servings of fruits and vegetables daily   65-80+ fluid ounces of water or unsweet tea for healthy kidneys   Limit to max 1 drink of  alcohol per day; avoid smoking/tobacco   Limit animal fats in diet for cholesterol and heart health - choose grass fed whenever available   Avoid highly processed foods, and foods high in saturated/trans fats   Aim for low stress - take time to unwind and care for your mental health   Aim for 150 min of moderate intensity exercise weekly for heart health, and weights twice weekly for bone health   Aim for 7-9 hours of sleep daily   When it comes to diets, agreement about the perfect plan isnt easy to find, even among the experts. Experts at the [place] developed an idea known as the Healthy Eating Plate. Just imagine a plate divided into logical, healthy portions.   The emphasis is on diet quality:   Load up on vegetables and fruits - one-half of your plate: Aim for color and variety, and remember that potatoes dont count.   Go for whole grains - one-quarter of your plate: Whole wheat, barley, wheat berries, quinoa, oats, brown rice, and foods made with them. If you want pasta, go with whole wheat pasta.   Protein power - one-quarter of your plate: Fish, chicken, beans, and nuts are all healthy, versatile protein sources. Limit red meat.   The diet, however, does go beyond the plate, offering a few other suggestions.   Use healthy plant oils, such as olive, canola, soy, corn, sunflower  and peanut. Check the labels, and avoid partially hydrogenated oil, which have unhealthy trans fats.   If youre thirsty, drink water. Coffee and tea are good in moderation, but skip sugary drinks and limit milk and dairy products to one or two daily servings.   The type of carbohydrate in the diet is more important than the amount. Some sources of carbohydrates, such as vegetables, fruits, whole grains, and beans-are healthier than others.   Finally, stay active  Signed, Berniece Salines, DO  02/14/2021 11:52 AM    [place]

## 2021-02-14 NOTE — Patient Instructions (Signed)
Medication Instructions:  Your physician recommends that you continue on your current medications as directed. Please refer to the Current Medication list given to you today.  *If you need a refill on your cardiac medications before your next appointment, please call your pharmacy*   Lab Work: None ordered   If you have labs (blood work) drawn today and your tests are completely normal, you will receive your results only by: [place] (if you have MyChart) OR A paper copy in the mail If you have any lab test that is abnormal or we need to change your treatment, we will call you to review the results.   Testing/Procedures: None ordered    Follow-Up: At [facility], you and your health needs are our priority.  As part of our continuing mission to provide you with exceptional heart care, we have created designated Provider Care Teams.  These Care Teams include your primary Cardiologist (physician) and Advanced Practice Providers (APPs -  Physician Assistants and Nurse Practitioners) who all work together to provide you with the care you need, when you need it.  We recommend signing up for the patient portal called "MyChart".  Sign up information is provided on this After Visit Summary.  MyChart is used to connect with patients for Virtual Visits (Telemedicine).  Patients are able to view lab/test results, encounter notes, upcoming appointments, etc.  Non-urgent messages can be sent to your provider as well.   To learn more about what you can do with MyChart, go to [email or web address].    Your next appointment:   12 month(s) at the [facility] location   The format for your next appointment:   In Person  Provider:   Dr. Berniece Salines   Other Instructions If you need to be seen sooner, call our office or send a mychart message

## 2021-03-13 DIAGNOSIS — Z006 Encounter for examination for normal comparison and control in clinical research program: Secondary | ICD-10-CM

## 2021-03-13 NOTE — Research (Signed)
Identify Research Study  Spoke with patient for 90 day Identify follow-up call. Patient stated that she did have a few cardiac symptoms since enrollment in the study on 10-19-2020, she sates that she had a few episodes of chest pain which radiated into shoulder area and that she had one syncopal event in March. Patient states that since her BP was controlled, she has had no further symptoms at this time.

## 2021-03-27 ENCOUNTER — Other Ambulatory Visit: Payer: Self-pay | Admitting: Family Medicine

## 2021-03-27 DIAGNOSIS — I10 Essential (primary) hypertension: Secondary | ICD-10-CM

## 2021-04-04 DIAGNOSIS — I10 Essential (primary) hypertension: Secondary | ICD-10-CM | POA: Diagnosis not present

## 2021-04-04 DIAGNOSIS — M4316 Spondylolisthesis, lumbar region: Secondary | ICD-10-CM | POA: Diagnosis not present

## 2021-04-27 DIAGNOSIS — M48062 Spinal stenosis, lumbar region with neurogenic claudication: Secondary | ICD-10-CM | POA: Diagnosis not present

## 2021-04-27 DIAGNOSIS — M5416 Radiculopathy, lumbar region: Secondary | ICD-10-CM | POA: Insufficient documentation

## 2021-04-27 DIAGNOSIS — I10 Essential (primary) hypertension: Secondary | ICD-10-CM | POA: Diagnosis not present

## 2021-05-04 DIAGNOSIS — M545 Low back pain, unspecified: Secondary | ICD-10-CM | POA: Diagnosis not present

## 2021-05-04 DIAGNOSIS — M48062 Spinal stenosis, lumbar region with neurogenic claudication: Secondary | ICD-10-CM | POA: Diagnosis not present

## 2021-05-08 ENCOUNTER — Encounter: Payer: Self-pay | Admitting: Family Medicine

## 2021-05-08 ENCOUNTER — Ambulatory Visit (INDEPENDENT_AMBULATORY_CARE_PROVIDER_SITE_OTHER): Payer: PPO | Admitting: Family Medicine

## 2021-05-08 ENCOUNTER — Other Ambulatory Visit: Payer: Self-pay

## 2021-05-08 VITALS — BP 128/78 | HR 66 | Temp 95.1°F | Ht 61.0 in | Wt 118.0 lb

## 2021-05-08 DIAGNOSIS — K219 Gastro-esophageal reflux disease without esophagitis: Secondary | ICD-10-CM | POA: Diagnosis not present

## 2021-05-08 DIAGNOSIS — Z23 Encounter for immunization: Secondary | ICD-10-CM | POA: Diagnosis not present

## 2021-05-08 DIAGNOSIS — M810 Age-related osteoporosis without current pathological fracture: Secondary | ICD-10-CM

## 2021-05-08 DIAGNOSIS — I471 Supraventricular tachycardia: Secondary | ICD-10-CM

## 2021-05-08 DIAGNOSIS — I10 Essential (primary) hypertension: Secondary | ICD-10-CM

## 2021-05-08 DIAGNOSIS — Z1159 Encounter for screening for other viral diseases: Secondary | ICD-10-CM | POA: Diagnosis not present

## 2021-05-08 DIAGNOSIS — E782 Mixed hyperlipidemia: Secondary | ICD-10-CM

## 2021-05-08 DIAGNOSIS — Z1211 Encounter for screening for malignant neoplasm of colon: Secondary | ICD-10-CM | POA: Insufficient documentation

## 2021-05-08 DIAGNOSIS — Z9189 Other specified personal risk factors, not elsewhere classified: Secondary | ICD-10-CM | POA: Diagnosis not present

## 2021-05-08 NOTE — Assessment & Plan Note (Signed)
>>  ASSESSMENT AND PLAN FOR HYPERTENSION WRITTEN ON 05/08/2021  8:35 AM BY COX, KIRSTEN, MD  Well-controlled.  Continue current medications.

## 2021-05-08 NOTE — Assessment & Plan Note (Signed)
Review chart to determine if patient's been on Fosamax for greater than 5 years.  Repeat bone density in 2023. Continue calcium citrate with daily.

## 2021-05-08 NOTE — Assessment & Plan Note (Signed)
Continue Mediterranean diet. Continue exercise. Await lipids to determine further medical treatment.

## 2021-05-08 NOTE — Assessment & Plan Note (Addendum)
Well-controlled.  Occasionally has an episode of palpitations when walking.  I recommended the patient to take an extra 12.5 mg metoprolol if needed.   Continue metoprolol 25 mg half tablet daily.

## 2021-05-08 NOTE — Assessment & Plan Note (Signed)
Continue Pepcid once daily.

## 2021-05-08 NOTE — Progress Notes (Signed)
Subjective:  Patient ID: Crystal Oneal, female    DOB: Feb 01, 1951  Age: 70 y.o. MRN: [id number]  Chief Complaint  Patient presents with   Hypertension   Hyperlipidemia    HPI Hyperlipidemia: Current medications: Fish Oil   Hypertension/SVT: Complications: Current medications: Losartan 50 mg daily, Metoprolol 12.5 mg daily  Diet: Mediterranean diet Exercise: Walks daily, attempts to walk 10K steps when capable.  Patient has been having right sciatica.  She had an MRI of her lumbar spine last week and is scheduled to see Dr. Arnoldo Morale in follow-up from this.  She is currently taking Advil which helps some.  She also has Tylenol which does not help at all.  GERD: On Pepcid 40 mg once daily. Allergic rhinitis (seasonal) takes Zyrtec, Flonase, Allegra, Pataday eyedrops as needed.  Currently is not taking these.  Osteoporosis: Patient is currently on Fosamax and calcium citrate.  Her last bone mineral density was April 2021.  Patient is unsure how long she has been on Fosamax but believes she can look back in her records.  She will let us know. Current Outpatient Medications on File Prior to Visit  Medication Sig Dispense Refill   alendronate (FOSAMAX) 70 MG tablet Take 1 tablet (70 mg total) by mouth every Friday. Take with a full glass of water on an empty stomach. 12 tablet 2   aspirin EC 81 MG tablet Take 81 mg by mouth daily.     b complex vitamins tablet Take 1 tablet by mouth daily.     calcium citrate-vitamin D (CITRACAL+D) 315-200 MG-UNIT tablet Take 2 tablets by mouth 2 (two) times daily.     cetirizine (ZYRTEC) 10 MG tablet Take 5 mg by mouth daily as needed for allergies.     Cholecalciferol (VITAMIN D) 2000 units tablet Take 2,000 Units by mouth daily.     famotidine (PEPCID) 40 MG tablet Take 1 tablet (40 mg total) by mouth daily. 90 tablet 3   fluticasone (FLONASE) 50 MCG/ACT nasal spray Place 1 spray into both nostrils daily as needed for allergies or rhinitis.      Ginger, Zingiber officinalis, (GINGER ROOT) 550 MG CAPS Take 550 mg by mouth daily.     losartan (COZAAR) 50 MG tablet TAKE ONE (1) TABLET BY MOUTH EVERY DAY 90 tablet 0   melatonin 1 MG TABS tablet Take 1 mg by mouth at bedtime.     metoprolol succinate (TOPROL-XL) 25 MG 24 hr tablet Take 12.5 mg by mouth daily.     Olopatadine HCl (PATADAY OP) Place 1 drop into both eyes daily as needed (allergy).     Omega-3 Fatty Acids (FISH OIL PO) Take 1 capsule by mouth daily.     TURMERIC CURCUMIN PO Take 750 mg by mouth daily.     vitamin C (ASCORBIC ACID) 500 MG tablet Take 500 mg by mouth daily.     No current facility-administered medications on file prior to visit.   Past Medical History:  Diagnosis Date   Age-related osteoporosis without current pathological fracture    Arthritis    Cancer ([place]) 04/2020   SCC skin cancer removed from nose   Cervical stenosis of spine    C11-12   Dysphagia    Esophageal dilation x2   Essential (primary) hypertension    GERD (gastroesophageal reflux disease)    Hypertension    Mixed hyperlipidemia    PONV (postoperative nausea and vomiting)    Spondylolisthesis of lumbar region    Past Surgical History:  Procedure Laterality Date   ABDOMINAL HYSTERECTOMY     ANTERIOR AND POSTERIOR REPAIR N/A 03/20/2016   Procedure: REPAIR CYSTOCELE AND RECTOCELE;  Surgeon: Bjorn Loser, MD;  Location: [facility];  Service: Urology;  Laterality: N/A;   BACK SURGERY  2019   has hardware   COLONOSCOPY     ESOPHAGEAL DILATION  2005   x 2   MOHS SURGERY  04/2020   SCC skin cancer removed from nose   VAGINAL HYSTERECTOMY Bilateral 03/20/2016   Procedure: TOTAL VAGINAL HYSTERECTOMY WITH BILATERAL SALPINGO OOPHORECTOMY mccall coloplasty;  Surgeon: Servando Salina, MD;  Location: [facility];  Service: Gynecology;  Laterality: Bilateral;   VAGINAL PROLAPSE REPAIR N/A 03/20/2016   Procedure: REPAIR VAULT PROLAPSE AND GRAFT;  Surgeon: Bjorn Loser, MD;  Location: [facility];   Service: Urology;  Laterality: N/A;    Family History  Problem Relation Age of Onset   Uterine cancer Mother    Breast cancer Mother    Lung disease Father    Breast cancer Sister    Multiple myeloma Brother    Social History   Socioeconomic History   Marital status: Married    Spouse name: Not on file   Number of children: 2   Years of education: Not on file   Highest education level: Not on file  Occupational History   Not on file  Tobacco Use   Smoking status: Never   Smokeless tobacco: Never  Vaping Use   Vaping Use: Never used  Substance and Sexual Activity   Alcohol use: Yes    Alcohol/week: 1.0 standard drink    Types: 1 Glasses of wine per week    Comment: rarely   Drug use: Never   Sexual activity: Not on file  Other Topics Concern   Not on file  Social History Narrative   Not on file   Social Determinants of Health   Financial Resource Strain: Low Risk    Difficulty of Paying Living Expenses: Not hard at all  Food Insecurity: No Food Insecurity   Worried About Charity fundraiser in the Last Year: Never true   [place] in the Last Year: Never true  Transportation Needs: No Transportation Needs   Lack of Transportation (Medical): No   Lack of Transportation (Non-Medical): No  Physical Activity: Sufficiently Active   Days of Exercise per Week: 5 days   Minutes of Exercise per Session: 30 min  Stress: No Stress Concern Present   Feeling of Stress : Not at all  Social Connections: Socially Integrated   Frequency of Communication with Friends and Family: More than three times a week   Frequency of Social Gatherings with Friends and Family: Twice a week   Attends Religious Services: More than 4 times per year   Active Member of [employer] or Organizations: Yes   Attends Music therapist: More than 4 times per year   Marital Status: Married    Review of Systems  Constitutional:  Negative for chills, fatigue and fever.  HENT:  Negative  for congestion, ear pain, rhinorrhea and sore throat.   Respiratory:  Negative for cough and shortness of breath.   Cardiovascular:  Negative for chest pain.  Gastrointestinal:  Negative for abdominal pain, constipation, diarrhea, nausea and vomiting.  Genitourinary:  Negative for dysuria and urgency.  Musculoskeletal:  Positive for arthralgias and back pain. Negative for myalgias.  Neurological:  Negative for dizziness, weakness, light-headedness and headaches.  Psychiatric/Behavioral:  Negative for dysphoric mood.  The patient is not nervous/anxious.     Objective:  BP 128/78   Pulse 66   Temp (!) 95.1 F (35.1 C)   Ht 5' 1"  (1.549 m)   Wt 118 lb (53.5 kg)   SpO2 100%   BMI 22.30 kg/m   BP/Weight 05/08/2021 0/09/6376 12/18/8500  Systolic BP 774 128 786  Diastolic BP 78 70 88  Wt. (Lbs) 118 115 117  BMI 22.3 21.73 22.11    Physical Exam Vitals reviewed.  Constitutional:      Appearance: Normal appearance. She is normal weight.  Neck:     Vascular: No carotid bruit.  Cardiovascular:     Rate and Rhythm: Normal rate and regular rhythm.     Pulses: Normal pulses.     Heart sounds: Normal heart sounds.  Pulmonary:     Effort: Pulmonary effort is normal. No respiratory distress.     Breath sounds: Normal breath sounds.  Abdominal:     General: Abdomen is flat. Bowel sounds are normal.     Palpations: Abdomen is soft.     Tenderness: There is no abdominal tenderness.  Neurological:     Mental Status: She is alert and oriented to person, place, and time.  Psychiatric:        Mood and Affect: Mood normal.        Behavior: Behavior normal.    Diabetic Foot Exam - Simple   No data filed      Lab Results  Component Value Date   WBC 4.4 12/28/2020   HGB 13.9 12/28/2020   HCT 41.4 12/28/2020   PLT 239 12/28/2020   GLUCOSE 92 12/28/2020   CHOL 182 11/14/2020   TRIG 61 11/14/2020   HDL 63 11/14/2020   LDLCALC 107 (H) 11/14/2020   ALT 26 12/28/2020   AST 34 12/28/2020    NA 137 12/28/2020   K 5.0 12/28/2020   CL 100 12/28/2020   CREATININE 0.87 12/28/2020   BUN 12 12/28/2020   CO2 26 12/28/2020   TSH 2.740 10/13/2020   INR 0.95 03/15/2016      Assessment & Plan:   Problem List Items Addressed This Visit       Cardiovascular and Mediastinum   Hypertension - Primary    Well-controlled.  Continue current medications.      Relevant Orders   Comprehensive metabolic panel   PSVT (paroxysmal supraventricular tachycardia) (HCC)    Well-controlled.  Occasionally has an episode of palpitations when walking.  I recommended the patient to take an extra 12.5 mg metoprolol if needed.   Continue metoprolol 25 mg half tablet daily.        Digestive   GERD (gastroesophageal reflux disease)    Continue Pepcid once daily.        Musculoskeletal and Integument   Age-related osteoporosis without current pathological fracture    Review chart to determine if patient's been on Fosamax for greater than 5 years.  Repeat bone density in 2023. Continue calcium citrate with daily.        Other   Mixed hyperlipidemia    Continue Mediterranean diet. Continue exercise. Await lipids to determine further medical treatment.      Relevant Orders   CBC with Differential/Platelet   Lipid panel   Encounter for hepatitis C virus screening test for high risk patient   Relevant Orders   HCV Ab w Reflex to Quant PCR   Other Visit Diagnoses     Need for immunization against influenza  Relevant Orders   Flu Vaccine QUAD High Dose(Fluad) (Completed)     .  No orders of the defined types were placed in this encounter.   Orders Placed This Encounter  Procedures   Flu Vaccine QUAD High Dose(Fluad)   CBC with Differential/Platelet   Comprehensive metabolic panel   Lipid panel   HCV Ab w Reflex to Quant PCR      Follow-up: Return in about 6 months (around 11/05/2021) for fasting, chronic fasting.  An After Visit Summary was printed and given to  the patient.  Rochel Brome, MD [facility] [phone]

## 2021-05-08 NOTE — Assessment & Plan Note (Addendum)
Well controlled. Continue current medications  

## 2021-05-09 DIAGNOSIS — M48062 Spinal stenosis, lumbar region with neurogenic claudication: Secondary | ICD-10-CM | POA: Diagnosis not present

## 2021-05-09 DIAGNOSIS — M4316 Spondylolisthesis, lumbar region: Secondary | ICD-10-CM | POA: Diagnosis not present

## 2021-05-09 DIAGNOSIS — M5136 Other intervertebral disc degeneration, lumbar region: Secondary | ICD-10-CM | POA: Diagnosis not present

## 2021-05-09 LAB — COMPREHENSIVE METABOLIC PANEL
ALT: 22 IU/L (ref 0–32)
AST: 33 IU/L (ref 0–40)
Albumin/Globulin Ratio: 2 (ref 1.2–2.2)
Albumin: 4.4 g/dL (ref 3.8–4.8)
Alkaline Phosphatase: 32 IU/L — ABNORMAL LOW (ref 44–121)
BUN/Creatinine Ratio: 15 (ref 12–28)
BUN: 12 mg/dL (ref 8–27)
Bilirubin Total: 0.3 mg/dL (ref 0.0–1.2)
CO2: 23 mmol/L (ref 20–29)
Calcium: 9.9 mg/dL (ref 8.7–10.3)
Chloride: 103 mmol/L (ref 96–106)
Creatinine, Ser: 0.8 mg/dL (ref 0.57–1.00)
Globulin, Total: 2.2 g/dL (ref 1.5–4.5)
Glucose: 91 mg/dL (ref 65–99)
Potassium: 4.5 mmol/L (ref 3.5–5.2)
Sodium: 141 mmol/L (ref 134–144)
Total Protein: 6.6 g/dL (ref 6.0–8.5)
eGFR: 80 mL/min/{1.73_m2} (ref >59)

## 2021-05-09 LAB — LIPID PANEL
Chol/HDL Ratio: 2.9 ratio (ref 0.0–4.4)
Cholesterol, Total: 208 mg/dL — ABNORMAL HIGH (ref 100–199)
HDL: 71 mg/dL (ref >39)
LDL Chol Calc (NIH): 124 mg/dL — ABNORMAL HIGH (ref 0–99)
Triglycerides: 70 mg/dL (ref 0–149)
VLDL Cholesterol Cal: 13 mg/dL (ref 5–40)

## 2021-05-09 LAB — CBC WITH DIFFERENTIAL/PLATELET
Basophils Absolute: 0 10*3/uL (ref 0.0–0.2)
Basos: 1 %
EOS (ABSOLUTE): 0.3 10*3/uL (ref 0.0–0.4)
Eos: 6 %
Hematocrit: 38.9 % (ref 34.0–46.6)
Hemoglobin: 12.7 g/dL (ref 11.1–15.9)
Immature Grans (Abs): 0 10*3/uL (ref 0.0–0.1)
Immature Granulocytes: 0 %
Lymphocytes Absolute: 1.3 10*3/uL (ref 0.7–3.1)
Lymphs: 32 %
MCH: 29.1 pg (ref 26.6–33.0)
MCHC: 32.6 g/dL (ref 31.5–35.7)
MCV: 89 fL (ref 79–97)
Monocytes Absolute: 0.6 10*3/uL (ref 0.1–0.9)
Monocytes: 15 %
Neutrophils Absolute: 2 10*3/uL (ref 1.4–7.0)
Neutrophils: 46 %
Platelets: 254 10*3/uL (ref 150–450)
RBC: 4.37 x10E6/uL (ref 3.77–5.28)
RDW: 11.9 % (ref 11.7–15.4)
WBC: 4.1 10*3/uL (ref 3.4–10.8)

## 2021-05-09 LAB — CARDIOVASCULAR RISK ASSESSMENT

## 2021-05-09 LAB — HCV INTERPRETATION

## 2021-05-09 LAB — HCV AB W REFLEX TO QUANT PCR: HCV Ab: <0.1 s/co ratio (ref 0.0–0.9)

## 2021-05-10 ENCOUNTER — Other Ambulatory Visit: Payer: Self-pay | Admitting: Neurosurgery

## 2021-05-10 ENCOUNTER — Telehealth: Payer: Self-pay | Admitting: *Deleted

## 2021-05-10 NOTE — Telephone Encounter (Signed)
   Name: Crystal Oneal  DOB: Aug 30, 1950  MRN: [id number]   Primary Cardiologist: Berniece Salines, DO  Chart reviewed as part of pre-operative protocol coverage. Patient was contacted 05/10/2021 in reference to pre-operative risk assessment for pending surgery as outlined below.  Crystal Oneal was last seen on 02/14/21 by Dr. Harriet Masson.  Since that day, Crystal Oneal has done a cardiac standpoint.  Despite her back pain she is able to complete 4 METS without anginal complaints.  She had a reassuring cardiac work-up this year including a coronary CTA which showed no evidence of CAD.  Therefore, based on ACC/AHA guidelines, the patient would be at acceptable risk for the planned procedure without further cardiovascular testing.   The patient was advised that if she develops new symptoms prior to surgery to contact our office to arrange for a follow-up visit, and she verbalized understanding.  Okay to hold aspirin 7 days prior to her upcoming procedure.  From a cardiac standpoint no indication to continue aspirin 81 mg daily.  She reports she was initially started on aspirin for colon cancer risk reduction.  Patient will discuss with PCP/gastroenterology to determine need for continuing this medication going forward.  I will route this recommendation to the requesting party via Epic fax function and remove from pre-op pool. Please call with questions.  Abigail Butts, PA-C 05/10/2021, 2:26 PM

## 2021-05-10 NOTE — Telephone Encounter (Signed)
   [facility] Pre-operative Risk Assessment    Patient Name: Crystal Oneal  DOB: Oct 01, 1950 MRN: [id number]  HEARTCARE STAFF:  - IMPORTANT!!!!!! Under Visit Info/Reason for Call, type in Other and utilize the format Clearance MM/DD/YY or Clearance TBD. Do not use dashes or single digits. - Please review there is not already an duplicate clearance open for this procedure. - If request is for dental extraction, please clarify the # of teeth to be extracted. - If the patient is currently at the dentist's office, call Pre-Op Callback Staff (MA/nurse) to input urgent request.  - If the patient is not currently in the dentist office, please route to the Pre-Op pool.  Request for surgical clearance:  What type of surgery is being performed? LUMBAR FUSION  When is this surgery scheduled? 05/22/21  What type of clearance is required (medical clearance vs. Pharmacy clearance to hold med vs. Both)? MEDICAL  Are there any medications that need to be held prior to surgery and how long? ASA  Practice name and name of physician performing surgery? [facility]; DR. Newman Pies  What is the office phone number? [phone]   7.   What is the office fax number? [place]: [email or web address]  8.   Anesthesia type (None, local, MAC, general) ? GENERAL   Julaine Hua 05/10/2021, 1:34 PM  _________________________________________________________________   (provider comments below)

## 2021-05-15 ENCOUNTER — Ambulatory Visit (INDEPENDENT_AMBULATORY_CARE_PROVIDER_SITE_OTHER): Payer: PPO

## 2021-05-15 VITALS — BP 138/78 | HR 73 | Resp 16 | Ht 61.0 in | Wt 119.8 lb

## 2021-05-15 DIAGNOSIS — M47816 Spondylosis without myelopathy or radiculopathy, lumbar region: Secondary | ICD-10-CM | POA: Insufficient documentation

## 2021-05-15 DIAGNOSIS — Z0000 Encounter for general adult medical examination without abnormal findings: Secondary | ICD-10-CM | POA: Diagnosis not present

## 2021-05-15 NOTE — Patient Instructions (Signed)
Health Maintenance, Female Adopting a healthy lifestyle and getting preventive care are important in promoting health and wellness. Ask your health care provider about: The right schedule for you to have regular tests and exams. Things you can do on your own to prevent diseases and keep yourself healthy. What should I know about diet, weight, and exercise? Eat a healthy diet  Eat a diet that includes plenty of vegetables, fruits, low-fat dairy products, and lean protein. Do not eat a lot of foods that are high in solid fats, added sugars, or sodium. Maintain a healthy weight Body mass index (BMI) is used to identify weight problems. It estimates body fat based on height and weight. Your health care provider can help determine your BMI and help you achieve or maintain a healthy weight. Get regular exercise Get regular exercise. This is one of the most important things you can do for your health. Most adults should: Exercise for at least 150 minutes each week. The exercise should increase your heart rate and make you sweat (moderate-intensity exercise). Do strengthening exercises at least twice a week. This is in addition to the moderate-intensity exercise. Spend less time sitting. Even light physical activity can be beneficial. Watch cholesterol and blood lipids Have your blood tested for lipids and cholesterol at 70 years of age, then have this test every 5 years. Have your cholesterol levels checked more often if: Your lipid or cholesterol levels are high. You are older than 70 years of age. You are at high risk for heart disease. What should I know about cancer screening? Depending on your health history and family history, you may need to have cancer screening at various ages. This may include screening for: Breast cancer. Cervical cancer. Colorectal cancer. Skin cancer. Lung cancer. What should I know about heart disease, diabetes, and high blood pressure? Blood pressure and heart  disease High blood pressure causes heart disease and increases the risk of stroke. This is more likely to develop in people who have high blood pressure readings, are of African descent, or are overweight. Have your blood pressure checked: Every 3-5 years if you are 18-39 years of age. Every year if you are 40 years old or older. Diabetes Have regular diabetes screenings. This checks your fasting blood sugar level. Have the screening done: Once every three years after age 40 if you are at a normal weight and have a low risk for diabetes. More often and at a younger age if you are overweight or have a high risk for diabetes. What should I know about preventing infection? Hepatitis B If you have a higher risk for hepatitis B, you should be screened for this virus. Talk with your health care provider to find out if you are at risk for hepatitis B infection. Hepatitis C Testing is recommended for: Everyone born from 1945 through 1965. Anyone with known risk factors for hepatitis C. Sexually transmitted infections (STIs) Get screened for STIs, including gonorrhea and chlamydia, if: You are sexually active and are younger than 70 years of age. You are older than 70 years of age and your health care provider tells you that you are at risk for this type of infection. Your sexual activity has changed since you were last screened, and you are at increased risk for chlamydia or gonorrhea. Ask your health care provider if you are at risk. Ask your health care provider about whether you are at high risk for HIV. Your health care provider may recommend a prescription medicine   to help prevent HIV infection. If you choose to take medicine to prevent HIV, you should first get tested for HIV. You should then be tested every 3 months for as long as you are taking the medicine. Pregnancy If you are about to stop having your period (premenopausal) and you may become pregnant, seek counseling before you get  pregnant. Take 400 to 800 micrograms (mcg) of folic acid every day if you become pregnant. Ask for birth control (contraception) if you want to prevent pregnancy. Osteoporosis and menopause Osteoporosis is a disease in which the bones lose minerals and strength with aging. This can result in bone fractures. If you are 65 years old or older, or if you are at risk for osteoporosis and fractures, ask your health care provider if you should: Be screened for bone loss. Take a calcium or vitamin D supplement to lower your risk of fractures. Be given hormone replacement therapy (HRT) to treat symptoms of menopause. Follow these instructions at home: Lifestyle Do not use any products that contain nicotine or tobacco, such as cigarettes, e-cigarettes, and chewing tobacco. If you need help quitting, ask your health care provider. Do not use street drugs. Do not share needles. Ask your health care provider for help if you need support or information about quitting drugs. Alcohol use Do not drink alcohol if: Your health care provider tells you not to drink. You are pregnant, may be pregnant, or are planning to become pregnant. If you drink alcohol: Limit how much you use to 0-1 drink a day. Limit intake if you are breastfeeding. Be aware of how much alcohol is in your drink. In the [place] one drink equals one 12 oz bottle of beer (355 mL), one 5 oz glass of wine (148 mL), or one 1 oz glass of hard liquor (44 mL). General instructions Schedule regular health, dental, and eye exams. Stay current with your vaccines. Tell your health care provider if: You often feel depressed. You have ever been abused or do not feel safe at home. Summary Adopting a healthy lifestyle and getting preventive care are important in promoting health and wellness. Follow your health care provider's instructions about healthy diet, exercising, and getting tested or screened for diseases. Follow your health care provider's  instructions on monitoring your cholesterol and blood pressure. This information is not intended to replace advice given to you by your health care provider. Make sure you discuss any questions you have with your health care provider. Document Revised: 10/07/2020 Document Reviewed: 07/23/2018 Elsevier Patient Education  2022 [employer]  

## 2021-05-15 NOTE — Progress Notes (Signed)
Subjective:   Crystal Oneal is a 70 y.o. female who presents for Medicare Annual (Subsequent) preventive examination.  This wellness visit is conducted by a nurse.  The patient's medications were reviewed and reconciled since the patient's last visit.  History details were provided by the patient.  The history appears to be reliable.    Patient's last AWV was more than two years ago.   Medical History: Patient history and Family history was reviewed  Medications, Allergies, and preventative health maintenance was reviewed and updated.   Review of Systems    Review of Systems  Constitutional: Negative.   HENT: Negative.    Respiratory:  Negative for cough and shortness of breath.   Cardiovascular:  Negative for chest pain and palpitations.  Gastrointestinal: Negative.   Musculoskeletal:  Positive for back pain.  Psychiatric/Behavioral: Negative.  Negative for dysphoric mood and suicidal ideas.    Cardiac Risk Factors include: advanced age (>3mn, >>81women);dyslipidemia;hypertension     Objective:    Today's Vitals   05/15/21 1358 05/15/21 1359  BP: 138/78   Pulse: 73   Resp: 16   SpO2: 95%   Weight: 119 lb 12.8 oz (54.3 kg)   Height: _0  (1.549 m)   PainSc:  5    Body mass index is 22.64 kg/m.  Advanced Directives 05/15/2021 09/21/2020 01/02/2018 12/25/2017 03/20/2016 03/15/2016  Does Patient Have a Medical Advance Directive? Yes No Yes Yes Yes Yes  Type of [place] will - Living will Living [place] Does patient want to make changes to medical advance directive? No - Patient declined - No - Patient declined No - Patient declined - No - Patient declined  Copy of [place] Chart? No - copy requested - - - No - copy requested No - copy requested  Would patient like information on creating a medical advance directive? - No - Patient declined - - -  -    Current Medications (verified) Outpatient Encounter Medications as of 05/15/2021  Medication Sig   acetaminophen (TYLENOL) 500 MG tablet Take 1,000 mg by mouth 2 (two) times daily as needed (pain.).   alendronate (FOSAMAX) 70 MG tablet Take 1 tablet (70 mg total) by mouth every Friday. Take with a full glass of water on an empty stomach. (Patient taking differently: Take 70 mg by mouth every Monday. Take with a full glass of water on an empty stomach.)   aspirin EC 81 MG tablet Take 81 mg by mouth every evening.   b complex vitamins tablet Take 1 tablet by mouth every evening.   calcium citrate-vitamin D (CITRACAL+D) 315-200 MG-UNIT tablet Take 2 tablets by mouth 2 (two) times daily.   cetirizine (ZYRTEC) 10 MG tablet Take 5-10 mg by mouth daily as needed for allergies (during spring months).   Cholecalciferol (VITAMIN D) 2000 units tablet Take 2,000 Units by mouth in the morning.   famotidine (PEPCID) 40 MG tablet Take 1 tablet (40 mg total) by mouth daily.   fluticasone (FLONASE) 50 MCG/ACT nasal spray Place 1 spray into both nostrils daily as needed for allergies or rhinitis.   Ginger, Zingiber officinalis, (GINGER ROOT) 550 MG CAPS Take 1 capsule by mouth in the morning and at bedtime.   ibuprofen (ADVIL) 200 MG tablet Take 400 mg by mouth every 4 (four) hours as needed (for pain).   Lidocaine 4 % PTCH Place 1 patch onto the  skin daily as needed (hip/back pain.).   losartan (COZAAR) 50 MG tablet TAKE ONE (1) TABLET BY MOUTH EVERY DAY (Patient taking differently: Take 50 mg by mouth every evening.)   melatonin 1 MG TABS tablet Take 1 mg by mouth at bedtime.   metoprolol succinate (TOPROL-XL) 25 MG 24 hr tablet Take 12.5 mg by mouth daily.   Olopatadine HCl (PATADAY OP) Place 1 drop into both eyes daily as needed (allergy).   Omega-3 Fatty Acids (FISH OIL PO) Take 2,400 mg by mouth in the morning.   trolamine salicylate (ASPERCREME) 10 % cream Apply 1 application topically as needed for  muscle pain (hip/back pain.).   TURMERIC CURCUMIN PO Take 1,000 mg by mouth in the morning.   vitamin B-12 (CYANOCOBALAMIN) 1000 MCG tablet Take 1,000 mcg by mouth in the morning.   vitamin C (ASCORBIC ACID) 250 MG tablet Take 250 mg by mouth 2 (two) times daily.   No facility-administered encounter medications on file as of 05/15/2021.    Allergies (verified) Pneumovax 23 [pneumococcal vac polyvalent], Lisinopril, and Sulfa antibiotics   History: Past Medical History:  Diagnosis Date   Age-related osteoporosis without current pathological fracture    Arthritis    Cancer ([place]) 04/2020   SCC skin cancer removed from nose   Cervical stenosis of spine    C11-12   Dysphagia    Esophageal dilation x2   Essential (primary) hypertension    GERD (gastroesophageal reflux disease)    Hypertension    Mixed hyperlipidemia    PONV (postoperative nausea and vomiting)    Spondylolisthesis of lumbar region    Past Surgical History:  Procedure Laterality Date   ABDOMINAL HYSTERECTOMY     ANTERIOR AND POSTERIOR REPAIR N/A 03/20/2016   Procedure: REPAIR CYSTOCELE AND RECTOCELE;  Surgeon: Bjorn Loser, MD;  Location: [facility];  Service: Urology;  Laterality: N/A;   BACK SURGERY  2019   has hardware   COLONOSCOPY     ESOPHAGEAL DILATION  2005   x 2   MOHS SURGERY  04/2020   SCC skin cancer removed from nose   VAGINAL HYSTERECTOMY Bilateral 03/20/2016   Procedure: TOTAL VAGINAL HYSTERECTOMY WITH BILATERAL SALPINGO OOPHORECTOMY mccall coloplasty;  Surgeon: Servando Salina, MD;  Location: [facility];  Service: Gynecology;  Laterality: Bilateral;   VAGINAL PROLAPSE REPAIR N/A 03/20/2016   Procedure: REPAIR VAULT PROLAPSE AND GRAFT;  Surgeon: Bjorn Loser, MD;  Location: [facility];  Service: Urology;  Laterality: N/A;   Family History  Problem Relation Age of Onset   Uterine cancer Mother    Breast cancer Mother    Lung disease Father    Breast cancer Sister    Multiple myeloma Brother     Kidney cancer Brother    Social History   Socioeconomic History   Marital status: Married    Spouse name: [name]   Number of children: 2  Occupational History   Occupation: Retired  Tobacco Use   Smoking status: Never   Smokeless tobacco: Never  Vaping Use   Vaping Use: Never used  Substance and Sexual Activity   Alcohol use: Yes    Alcohol/week: 1.0 standard drink    Types: 1 Glasses of wine per week    Comment: rarely   Drug use: Never   Sexual activity: Not on file   Social Determinants of Health   Financial Resource Strain: Low Risk    Difficulty of Paying Living Expenses: Not hard at all  Food Insecurity: No Food Insecurity  Worried About Charity fundraiser in the Last Year: Never true   [place] in the Last Year: Never true  Transportation Needs: No Transportation Needs   Lack of Transportation (Medical): No   Lack of Transportation (Non-Medical): No  Physical Activity: Sufficiently Active   Days of Exercise per Week: 5 days   Minutes of Exercise per Session: 30 min  Stress: No Stress Concern Present   Feeling of Stress : Not at all  Social Connections: Socially Integrated   Frequency of Communication with Friends and Family: More than three times a week   Frequency of Social Gatherings with Friends and Family: Twice a week   Attends Religious Services: More than 4 times per year   Active Member of [employer] or Organizations: Yes   Attends Music therapist: More than 4 times per year   Marital Status: Married    Tobacco Counseling Counseling given: Never tobacco user   Clinical Intake:  Pre-visit preparation completed: Yes  Pain : 0-10 Pain Score: 5  Pain Location: Other (Comment) (back and leg) Pain Descriptors / Indicators: Aching Pain Frequency: Constant   BMI - recorded: 22.64 Nutritional Status: BMI of 19-24  Normal Nutritional Risks: None Diabetes: No How often do you need to have someone help you when you read  instructions, pamphlets, or other written materials from your doctor or pharmacy?: 1 - Never Interpreter Needed?: No    Activities of Daily Living In your present state of health, do you have any difficulty performing the following activities: 05/15/2021 06/16/2020  Hearing? N N  Vision? N N  Difficulty concentrating or making decisions? N N  Walking or climbing stairs? N N  Dressing or bathing? N N  Doing errands, shopping? N N  Preparing Food and eating ? N -  Using the Toilet? N -  In the past six months, have you accidently leaked urine? N -  Do you have problems with loss of bowel control? N -  Managing your Medications? N -  Managing your Finances? N -  Housekeeping or managing your Housekeeping? N -  Some recent data might be hidden    Patient Care Team: Rochel Brome, MD as PCP - General (Family Medicine) Berniece Salines, DO as PCP - Cardiology (Cardiology) Nehemiah Settle, MD (Gastroenterology) Newman Pies, MD as Consulting Physician (Neurosurgery)    Assessment:   This is a routine wellness examination for [place].  Hearing/Vision screen No results found.  Dietary issues and exercise activities discussed: Current Exercise Habits: Home exercise routine, Type of exercise: walking, Time (Minutes): 20, Frequency (Times/Week): 3, Weekly Exercise (Minutes/Week): 60, Intensity: Mild, Exercise limited by: orthopedic condition(s)   Depression Screen PHQ 2/9 Scores 05/15/2021 05/08/2021 12/28/2020 06/16/2020  PHQ - 2 Score 0 0 0 0    Fall Risk Fall Risk  05/15/2021 05/08/2021 12/28/2020 06/16/2020 07/03/2019  Falls in the past year? 0 0 0 0 0  Comment - - - - Emmi Telephone Survey: data to providers prior to load  Number falls in past yr: 0 0 0 0 -  Comment - - - - -  Injury with Fall? 0 0 0 0 -  Risk for fall due to : No Fall Risks No Fall Risks - - -  Follow up Falls evaluation completed;Falls prevention discussed Falls evaluation completed - - -    FALL RISK PREVENTION  PERTAINING TO THE HOME:  Any stairs in or around the home? Yes  If so, are there any without  handrails? No  Home free of loose throw rugs in walkways, pet beds, electrical cords, etc? Yes  Adequate lighting in your home to reduce risk of falls? Yes   ASSISTIVE DEVICES UTILIZED TO PREVENT FALLS:  Use of a cane, walker or w/c? No  Gait steady and fast without use of assistive device  Cognitive Function:     6CIT Screen 05/15/2021  What Year? 0 points  What month? 0 points  What time? 0 points  Count back from 20 0 points  Months in reverse 0 points  Repeat phrase 0 points  Total Score 0    Immunizations Immunization History  Administered Date(s) Administered   Fluad Quad(high Dose 65+) 06/16/2020, 05/08/2021   Influenza-Unspecified 04/26/2017, 06/05/2019   Moderna SARS-COV2 Booster Vaccination 12/28/2020   Moderna Sars-Covid-2 Vaccination 09/11/2019, 10/07/2019, 06/06/2020   Pneumococcal Polysaccharide-23 05/29/2013   Tdap 08/13/2014    TDAP status: Up to date  Flu Vaccine status: Up to date  Pneumococcal vaccine status: Up to date  Covid-19 vaccine status: Information provided on how to obtain vaccines.   Qualifies for Shingles Vaccine? Yes   Zostavax completed No   Shingrix Completed?: Yes  Screening Tests Health Maintenance  Topic Date Due   Zoster Vaccines- Shingrix (1 of 2) Never done   COVID-19 Vaccine (5 - Booster for Moderna series) 04/30/2021   COLONOSCOPY (Pts 45-52yr Insurance coverage will need to be confirmed)  11/16/2021   MAMMOGRAM  02/06/2022   TETANUS/TDAP  08/13/2024   INFLUENZA VACCINE  Completed   DEXA SCAN  Completed   Hepatitis C Screening  Completed   HPV VACCINES  Aged Out    Health Maintenance  Health Maintenance Due  Topic Date Due   Zoster Vaccines- Shingrix (1 of 2) Never done   COVID-19 Vaccine (5 - Booster for Moderna series) 04/30/2021    Colorectal cancer screening: Type of screening: Colonoscopy. Completed 11/2016.  Repeat every 5 years  Mammogram status: Completed 01/2021. Repeat every year  Bone Density status: Completed 11/2019. Results reflect: Bone density results: OSTEOPOROSIS. Repeat every 2 years.  Lung Cancer Screening: (Low Dose CT Chest recommended if Age 70-80years, 30 pack-year currently smoking OR have quit w/in 15years.) does not qualify.   Additional Screening:  Vision Screening: Recommended annual ophthalmology exams for early detection of glaucoma and other disorders of the eye. Is the patient up to date with their annual eye exam?  No   Dental Screening: Recommended annual dental exams for proper oral hygiene    Plan:    1- COVID Booster recommended, patient plans to get after recovery from upcoming back surgery 2- Mammogram  - Due 01/2022 3- DEXA due 11/2021 (osteoporosis, Fosamax) 4- Colonoscopy - due 11/2021  I have personally reviewed and noted the following in the patient's chart:   Medical and social history Use of alcohol, tobacco or illicit drugs  Current medications and supplements including opioid prescriptions.  Functional ability and status Nutritional status Physical activity Advanced directives List of other physicians Hospitalizations, surgeries, and ER visits in previous 12 months Vitals Screenings to include cognitive, depression, and falls Referrals and appointments  In addition, I have reviewed and discussed with patient certain preventive protocols, quality metrics, and best practice recommendations. A written personalized care plan for preventive services as well as general preventive health recommendations were provided to patient.     [name] Noe LPN   [id number]

## 2021-05-16 ENCOUNTER — Other Ambulatory Visit: Payer: Self-pay | Admitting: Neurosurgery

## 2021-05-17 NOTE — Progress Notes (Signed)
Surgical Instructions    Your procedure is scheduled on Monday October 10.  Report to [facility] Main Entrance "A" at 5:30 A.M., then check in with the Admitting office.  Call this number if you have problems the morning of surgery:  [phone]   If you have any questions prior to your surgery date call [phone]: Open Monday-Friday 8am-4pm    Remember:  Do not eat or drink anything after midnight the night before your surgery     Take these medicines the morning of surgery with A SIP OF WATER: acetaminophen (TYLENOL) if needed famotidine (PEPCID) fluticasone (FLONASE) if needed metoprolol succinate (TOPROL-XL)   After your COVID test   You are not required to quarantine however you are required to wear a well-fitting mask when you are out and around people not in your household.  If your mask becomes wet or soiled, replace with a new one.  Wash your hands often with soap and water for 20 seconds or clean your hands with an alcohol-based hand sanitizer that contains at least 60% alcohol.  Do not share personal items.  Notify your provider: if you are in close contact with someone who has COVID  or if you develop a fever of 100.4 or greater, sneezing, cough, sore throat, shortness of breath or body aches.    As of today, STOP taking any Aspirin (unless otherwise instructed by your surgeon) Aleve, Naproxen, Ibuprofen, Motrin, Advil, Goody's, BC's, all herbal medications, fish oil, and all vitamins.          Do not wear jewelry or makeup Do not wear lotions, powders, perfumes/colognes, or deodorant. Do not shave 48 hours prior to surgery.  Men may shave face and neck. Do not bring valuables to the hospital. DO Not wear nail polish, gel polish, artificial nails, or any other type of covering on natural nails including finger and toenails. If patients have artificial nails, gel coating, etc. that need to be removed by a nail salon please have this removed prior to surgery  or surgery may need to be canceled/delayed if the surgeon/ anesthesia feels like the patient is unable to be adequately monitored.             [facility] is not responsible for any belongings or valuables.  Do NOT Smoke (Tobacco/Vaping)  24 hours prior to your procedure If you use a CPAP at night, you may bring your mask for your overnight stay.   Contacts, glasses, dentures or bridgework may not be worn into surgery, please bring cases for these belongings   For patients admitted to the hospital, discharge time will be determined by your treatment team.   Patients discharged the day of surgery will not be allowed to drive home, and someone needs to stay with them for 24 hours.  NO VISITORS WILL BE ALLOWED IN PRE-OP WHERE PATIENTS ARE PREPPED FOR SURGERY.  ONLY 1 SUPPORT PERSON MAY BE PRESENT IN THE WAITING ROOM WHILE YOU ARE IN SURGERY.  IF YOU ARE TO BE ADMITTED, ONCE YOU ARE IN YOUR ROOM YOU WILL BE ALLOWED TWO (2) VISITORS. 1 (ONE) VISITOR MAY STAY OVERNIGHT BUT MUST ARRIVE TO THE ROOM BY 8pm.  Minor children may have two parents present. Special consideration for safety and communication needs will be reviewed on a case by case basis.  Special instructions:    Oral Hygiene is also important to reduce your risk of infection.  Remember - BRUSH YOUR TEETH THE MORNING OF SURGERY WITH YOUR REGULAR TOOTHPASTE  [facility]- Preparing For Surgery  Before surgery, you can play an important role. Because skin is not sterile, your skin needs to be as free of germs as possible. You can reduce the number of germs on your skin by washing with CHG (chlorahexidine gluconate) Soap before surgery.  CHG is an antiseptic cleaner which kills germs and bonds with the skin to continue killing germs even after washing.     Please do not use if you have an allergy to CHG or antibacterial soaps. If your skin becomes reddened/irritated stop using the CHG.  Do not shave (including legs and underarms) for at  least 48 hours prior to first CHG shower. It is OK to shave your face.  Please follow these instructions carefully.     Shower the NIGHT BEFORE SURGERY and the MORNING OF SURGERY with CHG Soap.   If you chose to wash your hair, wash your hair first as usual with your normal shampoo. After you shampoo, rinse your hair and body thoroughly to remove the shampoo.  Then [employer] and genitals (private parts) with your normal soap and rinse thoroughly to remove soap.  After that Use CHG Soap as you would any other liquid soap. You can apply CHG directly to the skin and wash gently with a scrungie or a clean washcloth.   Apply the CHG Soap to your body ONLY FROM THE NECK DOWN.  Do not use on open wounds or open sores. Avoid contact with your eyes, ears, mouth and genitals (private parts). Wash Face and genitals (private parts)  with your normal soap.   Wash thoroughly, paying special attention to the area where your surgery will be performed.  Thoroughly rinse your body with warm water from the neck down.  DO NOT shower/wash with your normal soap after using and rinsing off the CHG Soap.  Pat yourself dry with a CLEAN TOWEL.  Wear CLEAN PAJAMAS to bed the night before surgery  Place CLEAN SHEETS on your bed the night before your surgery  DO NOT SLEEP WITH PETS.   Day of Surgery:  Take a shower with CHG soap. Wear Clean/Comfortable clothing the morning of surgery Do not apply any deodorants/lotions.   Remember to brush your teeth WITH YOUR REGULAR TOOTHPASTE.   Please read over the following fact sheets that you were given.

## 2021-05-18 ENCOUNTER — Encounter (HOSPITAL_COMMUNITY): Payer: Self-pay

## 2021-05-18 ENCOUNTER — Other Ambulatory Visit: Payer: Self-pay | Admitting: Neurosurgery

## 2021-05-18 ENCOUNTER — Other Ambulatory Visit: Payer: Self-pay

## 2021-05-18 ENCOUNTER — Encounter (HOSPITAL_COMMUNITY)
Admission: RE | Admit: 2021-05-18 | Discharge: 2021-05-18 | Disposition: A | Discharge status: Home / Self Care | Payer: PPO | Source: Ambulatory Visit | Attending: Neurosurgery | Admitting: Neurosurgery

## 2021-05-18 DIAGNOSIS — Z20822 Contact with and (suspected) exposure to covid-19: Secondary | ICD-10-CM | POA: Diagnosis not present

## 2021-05-18 DIAGNOSIS — Z79899 Other long term (current) drug therapy: Secondary | ICD-10-CM | POA: Diagnosis not present

## 2021-05-18 DIAGNOSIS — Z7982 Long term (current) use of aspirin: Secondary | ICD-10-CM | POA: Insufficient documentation

## 2021-05-18 DIAGNOSIS — K219 Gastro-esophageal reflux disease without esophagitis: Secondary | ICD-10-CM | POA: Insufficient documentation

## 2021-05-18 DIAGNOSIS — E785 Hyperlipidemia, unspecified: Secondary | ICD-10-CM | POA: Diagnosis not present

## 2021-05-18 DIAGNOSIS — M4316 Spondylolisthesis, lumbar region: Secondary | ICD-10-CM | POA: Diagnosis not present

## 2021-05-18 DIAGNOSIS — I10 Essential (primary) hypertension: Secondary | ICD-10-CM | POA: Diagnosis not present

## 2021-05-18 DIAGNOSIS — Z01812 Encounter for preprocedural laboratory examination: Secondary | ICD-10-CM | POA: Diagnosis not present

## 2021-05-18 HISTORY — DX: Supraventricular tachycardia: I47.1

## 2021-05-18 HISTORY — DX: Supraventricular tachycardia, unspecified: I47.10

## 2021-05-18 LAB — TYPE AND SCREEN
ABO/RH(D): O POS
Antibody Screen: NEGATIVE

## 2021-05-18 LAB — SURGICAL PCR SCREEN
MRSA, PCR: NEGATIVE
Staphylococcus aureus: NEGATIVE

## 2021-05-18 LAB — SARS CORONAVIRUS 2 (TAT 6-24 HRS): SARS Coronavirus 2: NEGATIVE

## 2021-05-18 NOTE — Progress Notes (Signed)
PCP - Dr. Rochel Brome Cardiologist - Dr. Berniece Salines  PPM/ICD - n/a  Chest x-ray - 10/30/20 EKG - 10/30/20 Stress Test - pt denies ECHO - 10/30/20 Cardiac Cath - pt denies  Sleep Study - pt denies  Fasting Blood Sugar - n/a  Blood Thinner Instructions: n/a Aspirin Instructions:  As of today, STOP taking any Aspirin (unless otherwise instructed by your surgeon) Aleve, Naproxen, Ibuprofen, Motrin, Advil, Goody's, BC's, all herbal medications, fish oil, and all vitamins.  NPO after midnight  COVID TEST- 05/18/21 in PAT  **pt reports that consent should include lumbar three (currently reads lumbar four-lumbar five). Will call office to have consent updated."  Anesthesia review: yes, hx of SVT  Patient denies shortness of breath, fever, cough and chest pain at PAT appointment   All instructions explained to the patient, with a verbal understanding of the material. Patient agrees to go over the instructions while at home for a better understanding. Patient also instructed to self quarantine after being tested for COVID-19. The opportunity to ask questions was provided.

## 2021-05-19 NOTE — Anesthesia Preprocedure Evaluation (Addendum)
Anesthesia Evaluation  Patient identified by MRN, date of birth, ID band Patient awake    Reviewed: Allergy & Precautions, NPO status , Patient's Chart, lab work & pertinent test results  History of Anesthesia Complications (+) PONV  Airway Mallampati: I  TM Distance: >3 FB Neck ROM: Full    Dental  (+) Dental Advisory Given   Pulmonary neg pulmonary ROS,  05/18/2021 SARS coronavirus NEG   breath sounds clear to auscultation       Cardiovascular hypertension, Pt. on medications and Pt. on home beta blockers (-) angina+ dysrhythmias Supra Ventricular Tachycardia  Rhythm:Regular Rate:Normal  Echo 10/30/20: IMPRESSIONS  1. Left ventricular ejection fraction, by estimation, is 60 to 65%. The  left ventricle has normal function. The left ventricle has no regional  wall motion abnormalities. There is mild concentric left ventricular  hypertrophy. Left ventricular diastolic  parameters are consistent with Grade I diastolic dysfunction (impaired  relaxation).  2. Right ventricular systolic function is normal. The right ventricular  size is normal. Tricuspid regurgitation signal is inadequate for assessing  PA pressure.  3. The mitral valve is normal in structure. Trivial mitral valve  regurgitation. No evidence of mitral stenosis.  4. The aortic valve was not well visualized. Aortic valve regurgitation  is not visualized. No aortic stenosis is present. Aortic valve area, by  VTI measures 2.60 cm. Aortic valve mean gradient measures 3.0 mmHg.  Aortic valve Vmax measures 1.01 m/s.  5. The inferior vena cava is normal in size with greater than 50%  respiratory variability, suggesting right atrial pressure of 3 mmHg.    Coronary CT 10/19/20: IMPRESSION: 1. Coronary calcium score of 0. This was 0 percentile for age and sex matched control. 2. Normal coronary origin with right dominance. 3. No evidence of CAD.    Neuro/Psych Chronic back pain    GI/Hepatic Neg liver ROS, GERD  Medicated and Controlled,  Endo/Other  negative endocrine ROS  Renal/GU negative Renal ROS     Musculoskeletal   Abdominal   Peds  Hematology negative hematology ROS (+)   Anesthesia Other Findings   Reproductive/Obstetrics                           Anesthesia Physical Anesthesia Plan  ASA: 3  Anesthesia Plan: General   Post-op Pain Management:    Induction: Intravenous  PONV Risk Score and Plan: 4 or greater and Ondansetron, Dexamethasone and Scopolamine patch - Pre-op  Airway Management Planned: Oral ETT  Additional Equipment: None  Intra-op Plan:   Post-operative Plan: Extubation in OR  Informed Consent: I have reviewed the patients History and Physical, chart, labs and discussed the procedure including the risks, benefits and alternatives for the proposed anesthesia with the patient or authorized representative who has indicated his/her understanding and acceptance.     Dental advisory given  Plan Discussed with: CRNA and Surgeon  Anesthesia Plan Comments: (PAT note written 05/19/2021 by Myra Gianotti, PA-C. )      Anesthesia Quick Evaluation

## 2021-05-19 NOTE — Progress Notes (Signed)
Anesthesia Chart Review:  Case: [id number] Date/Time: 05/22/21 0715   Procedure: PLIF,IP,POSTERIOR INSTRUMENTATION L3-4; EXPLORE FUSION - 3C   Anesthesia type: General   Pre-op diagnosis: LUMBAR ADJACENT SEGMENT DISEASE WITH SPONDYLOLISTHESIS   Location: [facility] ROOM 82 / [facility]   Surgeons: Newman Pies, MD       DISCUSSION: Patient is a 70 year old female scheduled for the above procedure.  History includes never smoker, postoperative N/V, HTN (with component of white coat hypertension as well), GERD, HLD, skin cancer, dysphagia (s/p esophageal dilatation x2), PSVT (2 episodes < 6 beats 10/2020 event monitor), spinal surgery (L4-5 PLIF 01/02/18), hysterectomy/BSO (03/20/16)  She had cardiac work-up around March 2022 for evaluation of chest pain. Overall reassuring echo, CCTA, and event monitor (see CV below). She does have preoperative cardiology input outlined by Roby Lofts, PA-A from 05/10/21, "[name] was last seen on 02/14/21 by Dr. Harriet Masson.  Since that day, SHAQUELA WEICHERT has done a cardiac standpoint.  Despite her back pain she is able to complete 4 METS without anginal complaints.  She had a reassuring cardiac work-up this year including a coronary CTA which showed no evidence of CAD.   Therefore, based on ACC/AHA guidelines, the patient would be at acceptable risk for the planned procedure without further cardiovascular testing...  Okay to hold aspirin 7 days prior to her upcoming procedure.  From a cardiac standpoint no indication to continue aspirin 81 mg daily.  She reports she was initially started on aspirin for colon cancer risk reduction.  Patient will discuss with PCP/gastroenterology to determine need for continuing this medication going forward."  She is on metoprolol for PSVT and can take an extra 12.5 mg if needed for palpitations.  She also has a history of whitecoat hypertension.   05/18/2021 presurgical COVID-19 test negative.  Anesthesia team to evaluate on the day of  surgery.   VS: BP (!) 181/86   Pulse 68   Temp 36.4 C (Oral)   Resp 18   Ht 5\' 1"  (1.549 m)   Wt 54.1 kg   SpO2 100%   BMI 22.52 kg/m   BP Readings from Last 3 Encounters:  05/18/21 (!) 181/86  05/15/21 138/78  05/08/21 128/78     PROVIDERS: Rochel Brome, MD is PCP  Berniece Salines, DO is cardiologist   LABS: Last labs from 05/08/21 and 05/18/21 reviewed. Results include: Lab Results  Component Value Date   WBC 4.1 05/08/2021   HGB 12.7 05/08/2021   HCT 38.9 05/08/2021   PLT 254 05/08/2021   GLUCOSE 91 05/08/2021   ALT 22 05/08/2021   AST 33 05/08/2021   NA 141 05/08/2021   K 4.5 05/08/2021   CL 103 05/08/2021   CREATININE 0.80 05/08/2021   BUN 12 05/08/2021   CO2 23 05/08/2021   TSH 2.740 10/13/2020     IMAGES: CXR 10/30/20: FINDINGS: Lungs clear. Heart size normal. No pneumothorax or pleural fluid. No acute or focal bony abnormality. IMPRESSION: Negative chest.    EKG: 10/30/20: Normal sinus rhythm Right atrial enlargement Septal infarct , age undetermined Abnormal ECG no change from previous Confirmed by Charlesetta Shanks [phone]) on 10/30/2020 1:34:14 PM - Negative T wave in V1-2 present on 09/21/20 tracing as well.    CV: Echo 10/30/20: IMPRESSIONS   1. Left ventricular ejection fraction, by estimation, is 60 to 65%. The  left ventricle has normal function. The left ventricle has no regional  wall motion abnormalities. There is mild concentric left ventricular  hypertrophy.  Left ventricular diastolic  parameters are consistent with Grade I diastolic dysfunction (impaired  relaxation).   2. Right ventricular systolic function is normal. The right ventricular  size is normal. Tricuspid regurgitation signal is inadequate for assessing  PA pressure.   3. The mitral valve is normal in structure. Trivial mitral valve  regurgitation. No evidence of mitral stenosis.   4. The aortic valve was not well visualized. Aortic valve regurgitation  is not  visualized. No aortic stenosis is present. Aortic valve area, by  VTI measures 2.60 cm. Aortic valve mean gradient measures 3.0 mmHg.  Aortic valve Vmax measures 1.01 m/s.   5. The inferior vena cava is normal in size with greater than 50%  respiratory variability, suggesting right atrial pressure of 3 mmHg.    Coronary CT 10/19/20: IMPRESSION: 1. Coronary calcium score of 0. This was 0 percentile for age and sex matched control. 2. Normal coronary origin with right dominance. 3. No evidence of CAD.    Long term event monitor 10/13/20-10/19/20: Patch Wear Time:  6 days and 0 hours (2022-03-03T11:48:50-0500 to 2022-03-09T12:08:55-0500)   Patient had a min HR of 53 bpm, max HR of 158 bpm, and avg HR of 77 bpm. Predominant underlying rhythm was Sinus Rhythm. First Degree AV Block was present. 2 Supraventricular Tachycardia runs occurred, the run with the fastest interval lasting 5 beats  with a max rate of 158 bpm, the longest lasting 6 beats with an avg rate of 105 bpm. Isolated SVEs were rare (<1.0%), SVE Triplets were rare (<1.0%), and no SVE Couplets were present. Isolated VEs were rare (<1.0%), and no VE Couplets or VE Triplets were  present.   There were no pauses of 3 seconds or greater and no episodes of second or third-degree AV nodal block or sinus node exit block. There were 6 triggered and 6 diary events all sinus rhythm. Ventricular ectopy was rare. Supraventricular ectopy was rare.   Conclusion unremarkable event monitor.   Past Medical History:  Diagnosis Date   Age-related osteoporosis without current pathological fracture    Arthritis    Cancer ([place]) 04/2020   SCC skin cancer removed from nose   Cervical stenosis of spine    C11-12   Dysphagia    Esophageal dilation x2   Essential (primary) hypertension    GERD (gastroesophageal reflux disease)    Hypertension    Mixed hyperlipidemia    Paroxysmal SVT (supraventricular tachycardia) (HCC)    PONV (postoperative  nausea and vomiting)    Spondylolisthesis of lumbar region     Past Surgical History:  Procedure Laterality Date   ABDOMINAL HYSTERECTOMY     ANTERIOR AND POSTERIOR REPAIR N/A 03/20/2016   Procedure: REPAIR CYSTOCELE AND RECTOCELE;  Surgeon: Bjorn Loser, MD;  Location: [facility];  Service: Urology;  Laterality: N/A;   BACK SURGERY  2019   has hardware   COLONOSCOPY     ESOPHAGEAL DILATION  2005   x 2   MOHS SURGERY  04/2020   SCC skin cancer removed from nose   VAGINAL HYSTERECTOMY Bilateral 03/20/2016   Procedure: TOTAL VAGINAL HYSTERECTOMY WITH BILATERAL SALPINGO OOPHORECTOMY mccall coloplasty;  Surgeon: Servando Salina, MD;  Location: [facility];  Service: Gynecology;  Laterality: Bilateral;   VAGINAL PROLAPSE REPAIR N/A 03/20/2016   Procedure: REPAIR VAULT PROLAPSE AND GRAFT;  Surgeon: Bjorn Loser, MD;  Location: [facility];  Service: Urology;  Laterality: N/A;    MEDICATIONS:  acetaminophen (TYLENOL) 500 MG tablet   alendronate (FOSAMAX) 70 MG tablet  aspirin EC 81 MG tablet   b complex vitamins tablet   calcium citrate-vitamin D (CITRACAL+D) 315-200 MG-UNIT tablet   cetirizine (ZYRTEC) 10 MG tablet   Cholecalciferol (VITAMIN D) 2000 units tablet   famotidine (PEPCID) 40 MG tablet   fluticasone (FLONASE) 50 MCG/ACT nasal spray   Ginger, Zingiber officinalis, (GINGER ROOT) 550 MG CAPS   ibuprofen (ADVIL) 200 MG tablet   Lidocaine 4 % PTCH   losartan (COZAAR) 50 MG tablet   melatonin 1 MG TABS tablet   metoprolol succinate (TOPROL-XL) 25 MG 24 hr tablet   Olopatadine HCl (PATADAY OP)   Omega-3 Fatty Acids (FISH OIL PO)   trolamine salicylate (ASPERCREME) 10 % cream   TURMERIC CURCUMIN PO   vitamin B-12 (CYANOCOBALAMIN) 1000 MCG tablet   vitamin C (ASCORBIC ACID) 250 MG tablet   No current facility-administered medications for this encounter.  Per PAT RN documentation, last aspirin 05/18/2021 unless otherwise instructed by surgeon.   Myra Gianotti, PA-C Surgical  Short Stay/Anesthesiology [facility] Phone [phone] [facility] Phone [phone] 05/19/2021 12:34 PM

## 2021-05-22 ENCOUNTER — Inpatient Hospital Stay (HOSPITAL_COMMUNITY)
Admission: RE | Admit: 2021-05-22 | Discharge: 2021-05-23 | DRG: 455 | Disposition: A | Discharge status: Home / Self Care | Payer: PPO | Attending: Neurosurgery | Admitting: Neurosurgery

## 2021-05-22 ENCOUNTER — Encounter (HOSPITAL_COMMUNITY)
Admission: RE | Disposition: A | Discharge status: Home / Self Care | Payer: Self-pay | Source: Home / Self Care | Attending: Neurosurgery

## 2021-05-22 ENCOUNTER — Other Ambulatory Visit: Payer: Self-pay

## 2021-05-22 ENCOUNTER — Inpatient Hospital Stay (HOSPITAL_COMMUNITY): Payer: PPO | Admitting: Certified Registered"

## 2021-05-22 ENCOUNTER — Inpatient Hospital Stay (HOSPITAL_COMMUNITY): Payer: PPO

## 2021-05-22 ENCOUNTER — Inpatient Hospital Stay (HOSPITAL_COMMUNITY): Payer: PPO | Admitting: Vascular Surgery

## 2021-05-22 ENCOUNTER — Encounter (HOSPITAL_COMMUNITY): Payer: Self-pay | Admitting: Neurosurgery

## 2021-05-22 DIAGNOSIS — M810 Age-related osteoporosis without current pathological fracture: Secondary | ICD-10-CM | POA: Diagnosis not present

## 2021-05-22 DIAGNOSIS — E782 Mixed hyperlipidemia: Secondary | ICD-10-CM | POA: Diagnosis present

## 2021-05-22 DIAGNOSIS — Z882 Allergy status to sulfonamides status: Secondary | ICD-10-CM | POA: Diagnosis not present

## 2021-05-22 DIAGNOSIS — Z79899 Other long term (current) drug therapy: Secondary | ICD-10-CM | POA: Diagnosis not present

## 2021-05-22 DIAGNOSIS — M4316 Spondylolisthesis, lumbar region: Secondary | ICD-10-CM | POA: Diagnosis not present

## 2021-05-22 DIAGNOSIS — M48062 Spinal stenosis, lumbar region with neurogenic claudication: Secondary | ICD-10-CM | POA: Diagnosis not present

## 2021-05-22 DIAGNOSIS — M4802 Spinal stenosis, cervical region: Secondary | ICD-10-CM | POA: Diagnosis present

## 2021-05-22 DIAGNOSIS — K219 Gastro-esophageal reflux disease without esophagitis: Secondary | ICD-10-CM | POA: Diagnosis present

## 2021-05-22 DIAGNOSIS — Z90710 Acquired absence of both cervix and uterus: Secondary | ICD-10-CM | POA: Diagnosis not present

## 2021-05-22 DIAGNOSIS — M4726 Other spondylosis with radiculopathy, lumbar region: Secondary | ICD-10-CM | POA: Diagnosis not present

## 2021-05-22 DIAGNOSIS — M4326 Fusion of spine, lumbar region: Secondary | ICD-10-CM | POA: Diagnosis not present

## 2021-05-22 DIAGNOSIS — Z7983 Long term (current) use of bisphosphonates: Secondary | ICD-10-CM | POA: Diagnosis not present

## 2021-05-22 DIAGNOSIS — Z888 Allergy status to other drugs, medicaments and biological substances status: Secondary | ICD-10-CM

## 2021-05-22 DIAGNOSIS — Z419 Encounter for procedure for purposes other than remedying health state, unspecified: Secondary | ICD-10-CM

## 2021-05-22 DIAGNOSIS — Z981 Arthrodesis status: Secondary | ICD-10-CM

## 2021-05-22 DIAGNOSIS — I10 Essential (primary) hypertension: Secondary | ICD-10-CM | POA: Diagnosis not present

## 2021-05-22 DIAGNOSIS — Z85828 Personal history of other malignant neoplasm of skin: Secondary | ICD-10-CM

## 2021-05-22 DIAGNOSIS — Z887 Allergy status to serum and vaccine status: Secondary | ICD-10-CM | POA: Diagnosis not present

## 2021-05-22 DIAGNOSIS — M5136 Other intervertebral disc degeneration, lumbar region: Secondary | ICD-10-CM

## 2021-05-22 DIAGNOSIS — M5116 Intervertebral disc disorders with radiculopathy, lumbar region: Secondary | ICD-10-CM | POA: Diagnosis not present

## 2021-05-22 DIAGNOSIS — Z7982 Long term (current) use of aspirin: Secondary | ICD-10-CM

## 2021-05-22 SURGERY — POSTERIOR LUMBAR FUSION 1 LEVEL
Anesthesia: General

## 2021-05-22 MED ORDER — SCOPOLAMINE 1 MG/3DAYS TD PT72
1.0000 | MEDICATED_PATCH | TRANSDERMAL | Status: DC
  Administered 2021-05-22: 1.5 mg via TRANSDERMAL
  Filled 2021-05-22: qty 1

## 2021-05-22 MED ORDER — ONDANSETRON HCL 4 MG PO TABS: 4.0000 mg | ORAL_TABLET | Freq: Four times a day (QID) | ORAL | Status: DC | PRN

## 2021-05-22 MED ORDER — BISACODYL 10 MG RE SUPP: 10.0000 mg | Freq: Every day | RECTAL | Status: DC | PRN

## 2021-05-22 MED ORDER — METOPROLOL SUCCINATE ER 25 MG PO TB24: 12.5000 mg | ORAL_TABLET | Freq: Every day | ORAL | Status: DC

## 2021-05-22 MED ORDER — SODIUM CHLORIDE 0.9 % IV SOLN
250.0000 mL | INTRAVENOUS | Status: DC
  Administered 2021-05-22: 250 mL via INTRAVENOUS

## 2021-05-22 MED ORDER — HYDROMORPHONE HCL 1 MG/ML IJ SOLN
0.2500 mg | INTRAMUSCULAR | Status: DC | PRN
  Administered 2021-05-22: 0.25 mg via INTRAVENOUS

## 2021-05-22 MED ORDER — ACETAMINOPHEN 325 MG PO TABS
650.0000 mg | ORAL_TABLET | ORAL | Status: DC | PRN
  Administered 2021-05-22 – 2021-05-23 (×2): 650 mg via ORAL
  Filled 2021-05-22 (×2): qty 2

## 2021-05-22 MED ORDER — BUPIVACAINE-EPINEPHRINE (PF) 0.5% -1:200000 IJ SOLN
INTRAMUSCULAR | Status: DC | PRN
  Administered 2021-05-22: 10 mL via PERINEURAL

## 2021-05-22 MED ORDER — PHENOL 1.4 % MT LIQD: 1.0000 | OROMUCOSAL | Status: DC | PRN

## 2021-05-22 MED ORDER — ONDANSETRON HCL 4 MG/2ML IJ SOLN
INTRAMUSCULAR | Status: AC
  Filled 2021-05-22: qty 2

## 2021-05-22 MED ORDER — FENTANYL CITRATE (PF) 250 MCG/5ML IJ SOLN
INTRAMUSCULAR | Status: DC | PRN
  Administered 2021-05-22 (×5): 50 ug via INTRAVENOUS

## 2021-05-22 MED ORDER — ONDANSETRON HCL 4 MG/2ML IJ SOLN: 4.0000 mg | Freq: Four times a day (QID) | INTRAMUSCULAR | Status: DC | PRN

## 2021-05-22 MED ORDER — LOSARTAN POTASSIUM 50 MG PO TABS
50.0000 mg | ORAL_TABLET | Freq: Every evening | ORAL | Status: DC
  Administered 2021-05-22: 50 mg via ORAL
  Filled 2021-05-22: qty 1

## 2021-05-22 MED ORDER — PROPOFOL 10 MG/ML IV BOLUS
INTRAVENOUS | Status: AC
  Filled 2021-05-22: qty 20

## 2021-05-22 MED ORDER — PROPOFOL 10 MG/ML IV BOLUS
INTRAVENOUS | Status: DC | PRN
  Administered 2021-05-22: 30 mg via INTRAVENOUS
  Administered 2021-05-22: 120 mg via INTRAVENOUS

## 2021-05-22 MED ORDER — SODIUM CHLORIDE 0.9% FLUSH: 3.0000 mL | INTRAVENOUS | Status: DC | PRN

## 2021-05-22 MED ORDER — PROMETHAZINE HCL 25 MG/ML IJ SOLN: 6.2500 mg | INTRAMUSCULAR | Status: DC | PRN

## 2021-05-22 MED ORDER — BUPIVACAINE LIPOSOME 1.3 % IJ SUSP
INTRAMUSCULAR | Status: DC | PRN
  Administered 2021-05-22: 20 mL

## 2021-05-22 MED ORDER — PHENYLEPHRINE HCL-NACL 20-0.9 MG/250ML-% IV SOLN
INTRAVENOUS | Status: DC | PRN
  Administered 2021-05-22: 20 ug/min via INTRAVENOUS

## 2021-05-22 MED ORDER — LIDOCAINE 2% (20 MG/ML) 5 ML SYRINGE
INTRAMUSCULAR | Status: DC | PRN
  Administered 2021-05-22: 20 mg via INTRAVENOUS

## 2021-05-22 MED ORDER — LIDOCAINE 2% (20 MG/ML) 5 ML SYRINGE
INTRAMUSCULAR | Status: AC
  Filled 2021-05-22: qty 5

## 2021-05-22 MED ORDER — BUPIVACAINE-EPINEPHRINE 0.5% -1:200000 IJ SOLN
INTRAMUSCULAR | Status: AC
  Filled 2021-05-22: qty 1

## 2021-05-22 MED ORDER — CEFAZOLIN SODIUM-DEXTROSE 2-4 GM/100ML-% IV SOLN
2.0000 g | INTRAVENOUS | Status: AC
  Administered 2021-05-22: 2 g via INTRAVENOUS
  Filled 2021-05-22: qty 100

## 2021-05-22 MED ORDER — MIDAZOLAM HCL 2 MG/2ML IJ SOLN
INTRAMUSCULAR | Status: AC
  Filled 2021-05-22: qty 2

## 2021-05-22 MED ORDER — CYCLOBENZAPRINE HCL 10 MG PO TABS
10.0000 mg | ORAL_TABLET | Freq: Three times a day (TID) | ORAL | Status: DC | PRN
  Administered 2021-05-22: 10 mg via ORAL
  Filled 2021-05-22: qty 1

## 2021-05-22 MED ORDER — FENTANYL CITRATE (PF) 250 MCG/5ML IJ SOLN
INTRAMUSCULAR | Status: AC
  Filled 2021-05-22: qty 5

## 2021-05-22 MED ORDER — FAMOTIDINE 20 MG PO TABS: 40.0000 mg | ORAL_TABLET | Freq: Every day | ORAL | Status: DC

## 2021-05-22 MED ORDER — BACITRACIN ZINC 500 UNIT/GM EX OINT
TOPICAL_OINTMENT | CUTANEOUS | Status: AC
  Filled 2021-05-22: qty 28.35

## 2021-05-22 MED ORDER — HYDROXYZINE HCL 50 MG/ML IM SOLN: 50.0000 mg | Freq: Four times a day (QID) | INTRAMUSCULAR | Status: DC | PRN

## 2021-05-22 MED ORDER — MORPHINE SULFATE (PF) 4 MG/ML IV SOLN
4.0000 mg | INTRAVENOUS | Status: DC | PRN
  Filled 2021-05-22: qty 1

## 2021-05-22 MED ORDER — CHLORHEXIDINE GLUCONATE CLOTH 2 % EX PADS: 6.0000 | MEDICATED_PAD | Freq: Once | CUTANEOUS | Status: DC

## 2021-05-22 MED ORDER — ROCURONIUM BROMIDE 10 MG/ML (PF) SYRINGE
PREFILLED_SYRINGE | INTRAVENOUS | Status: AC
  Filled 2021-05-22: qty 10

## 2021-05-22 MED ORDER — BACITRACIN ZINC 500 UNIT/GM EX OINT
TOPICAL_OINTMENT | CUTANEOUS | Status: DC | PRN
  Administered 2021-05-22: 1 via TOPICAL

## 2021-05-22 MED ORDER — HYDROMORPHONE HCL 1 MG/ML IJ SOLN
INTRAMUSCULAR | Status: AC
  Filled 2021-05-22: qty 1

## 2021-05-22 MED ORDER — MENTHOL 3 MG MT LOZG: 1.0000 | LOZENGE | OROMUCOSAL | Status: DC | PRN

## 2021-05-22 MED ORDER — 0.9 % SODIUM CHLORIDE (POUR BTL) OPTIME
TOPICAL | Status: DC | PRN
  Administered 2021-05-22: 1000 mL

## 2021-05-22 MED ORDER — LACTATED RINGERS IV SOLN: INTRAVENOUS | Status: DC | PRN

## 2021-05-22 MED ORDER — OXYCODONE HCL 5 MG/5ML PO SOLN
5.0000 mg | Freq: Once | ORAL | Status: DC | PRN
Start: 2021-05-22 — End: 2021-05-22

## 2021-05-22 MED ORDER — ROCURONIUM BROMIDE 10 MG/ML (PF) SYRINGE
PREFILLED_SYRINGE | INTRAVENOUS | Status: DC | PRN
  Administered 2021-05-22: 20 mg via INTRAVENOUS
  Administered 2021-05-22: 60 mg via INTRAVENOUS
  Administered 2021-05-22: 20 mg via INTRAVENOUS

## 2021-05-22 MED ORDER — ONDANSETRON HCL 4 MG/2ML IJ SOLN
INTRAMUSCULAR | Status: DC | PRN
  Administered 2021-05-22: 4 mg via INTRAVENOUS

## 2021-05-22 MED ORDER — DEXAMETHASONE SODIUM PHOSPHATE 10 MG/ML IJ SOLN
INTRAMUSCULAR | Status: AC
  Filled 2021-05-22: qty 1

## 2021-05-22 MED ORDER — SODIUM CHLORIDE 0.9% FLUSH
3.0000 mL | Freq: Two times a day (BID) | INTRAVENOUS | Status: DC
  Administered 2021-05-22: 3 mL via INTRAVENOUS

## 2021-05-22 MED ORDER — OXYCODONE HCL 5 MG PO TABS: 5.0000 mg | ORAL_TABLET | Freq: Once | ORAL | Status: DC | PRN

## 2021-05-22 MED ORDER — MIDAZOLAM HCL 2 MG/2ML IJ SOLN: 0.5000 mg | Freq: Once | INTRAMUSCULAR | Status: DC | PRN

## 2021-05-22 MED ORDER — OXYCODONE HCL 5 MG PO TABS: 5.0000 mg | ORAL_TABLET | ORAL | Status: DC | PRN

## 2021-05-22 MED ORDER — DEXAMETHASONE SODIUM PHOSPHATE 10 MG/ML IJ SOLN
INTRAMUSCULAR | Status: DC | PRN
  Administered 2021-05-22: 10 mg via INTRAVENOUS

## 2021-05-22 MED ORDER — BUPIVACAINE LIPOSOME 1.3 % IJ SUSP
INTRAMUSCULAR | Status: AC
  Filled 2021-05-22: qty 20

## 2021-05-22 MED ORDER — LORATADINE 10 MG PO TABS: 10.0000 mg | ORAL_TABLET | Freq: Every day | ORAL | Status: DC

## 2021-05-22 MED ORDER — ZOLPIDEM TARTRATE 5 MG PO TABS: 5.0000 mg | ORAL_TABLET | Freq: Every evening | ORAL | Status: DC | PRN

## 2021-05-22 MED ORDER — CEFAZOLIN SODIUM-DEXTROSE 2-4 GM/100ML-% IV SOLN
2.0000 g | Freq: Three times a day (TID) | INTRAVENOUS | Status: AC
  Administered 2021-05-22 (×2): 2 g via INTRAVENOUS
  Filled 2021-05-22 (×2): qty 100

## 2021-05-22 MED ORDER — DOCUSATE SODIUM 100 MG PO CAPS
100.0000 mg | ORAL_CAPSULE | Freq: Two times a day (BID) | ORAL | Status: DC
  Administered 2021-05-22: 100 mg via ORAL
  Filled 2021-05-22: qty 1

## 2021-05-22 MED ORDER — ORAL CARE MOUTH RINSE: 15.0000 mL | Freq: Once | OROMUCOSAL | Status: AC

## 2021-05-22 MED ORDER — OXYCODONE HCL 5 MG PO TABS
10.0000 mg | ORAL_TABLET | ORAL | Status: DC | PRN
  Administered 2021-05-22 – 2021-05-23 (×5): 10 mg via ORAL
  Filled 2021-05-22 (×5): qty 2

## 2021-05-22 MED ORDER — LACTATED RINGERS IV SOLN: INTRAVENOUS | Status: DC

## 2021-05-22 MED ORDER — SUGAMMADEX SODIUM 200 MG/2ML IV SOLN
INTRAVENOUS | Status: DC | PRN
  Administered 2021-05-22: 200 mg via INTRAVENOUS

## 2021-05-22 MED ORDER — ACETAMINOPHEN 500 MG PO TABS
1000.0000 mg | ORAL_TABLET | Freq: Four times a day (QID) | ORAL | Status: DC
  Administered 2021-05-22 – 2021-05-23 (×2): 1000 mg via ORAL
  Filled 2021-05-22 (×2): qty 2

## 2021-05-22 MED ORDER — CHLORHEXIDINE GLUCONATE 0.12 % MT SOLN
15.0000 mL | Freq: Once | OROMUCOSAL | Status: AC
  Administered 2021-05-22: 15 mL via OROMUCOSAL
  Filled 2021-05-22: qty 15

## 2021-05-22 MED ORDER — PHENYLEPHRINE 40 MCG/ML (10ML) SYRINGE FOR IV PUSH (FOR BLOOD PRESSURE SUPPORT)
PREFILLED_SYRINGE | INTRAVENOUS | Status: DC | PRN
  Administered 2021-05-22 (×4): 80 ug via INTRAVENOUS

## 2021-05-22 MED ORDER — ACETAMINOPHEN 650 MG RE SUPP: 650.0000 mg | RECTAL | Status: DC | PRN

## 2021-05-22 MED ORDER — THROMBIN 5000 UNITS EX SOLR
CUTANEOUS | Status: AC
  Filled 2021-05-22: qty 5000

## 2021-05-22 MED ORDER — THROMBIN 5000 UNITS EX SOLR
OROMUCOSAL | Status: DC | PRN
  Administered 2021-05-22: 5 mL via TOPICAL

## 2021-05-22 MED ORDER — MIDAZOLAM HCL 5 MG/5ML IJ SOLN
INTRAMUSCULAR | Status: DC | PRN
  Administered 2021-05-22: 2 mg via INTRAVENOUS

## 2021-05-22 SURGICAL SUPPLY — 67 items
BAG COUNTER SPONGE SURGICOUNT (BAG) ×4 IMPLANT
BASKET BONE COLLECTION (BASKET) ×2 IMPLANT
BENZOIN TINCTURE PRP APPL 2/3 (GAUZE/BANDAGES/DRESSINGS) IMPLANT
BLADE CLIPPER SURG (BLADE) IMPLANT
BUR MATCHSTICK NEURO 3.0 LAGG (BURR) ×2 IMPLANT
BUR PRECISION FLUTE 6.0 (BURR) ×2 IMPLANT
CAGE ALTERA 10X31X9-13 15D (Cage) ×2 IMPLANT
CANISTER SUCT 3000ML PPV (MISCELLANEOUS) ×2 IMPLANT
CAP REVERE LOCKING (Cap) ×14 IMPLANT
CARTRIDGE OIL MAESTRO DRILL (MISCELLANEOUS) ×1 IMPLANT
CNTNR URN SCR LID CUP LEK RST (MISCELLANEOUS) ×1 IMPLANT
CONT SPEC 4OZ STRL OR WHT (MISCELLANEOUS) ×1
COVER BACK TABLE 60X90IN (DRAPES) ×2 IMPLANT
DECANTER SPIKE VIAL GLASS SM (MISCELLANEOUS) ×2 IMPLANT
DIFFUSER DRILL AIR PNEUMATIC (MISCELLANEOUS) ×2 IMPLANT
DRAPE C-ARM 42X72 X-RAY (DRAPES) ×4 IMPLANT
DRAPE HALF SHEET 40X57 (DRAPES) ×2 IMPLANT
DRAPE LAPAROTOMY 100X72X124 (DRAPES) ×2 IMPLANT
DRAPE SURG 17X23 STRL (DRAPES) ×8 IMPLANT
DRSG OPSITE POSTOP 4X6 (GAUZE/BANDAGES/DRESSINGS) ×2 IMPLANT
ELECT BLADE 4.0 EZ CLEAN MEGAD (MISCELLANEOUS) ×2
ELECT REM PT RETURN 9FT ADLT (ELECTROSURGICAL) ×2
ELECTRODE BLDE 4.0 EZ CLN MEGD (MISCELLANEOUS) ×1 IMPLANT
ELECTRODE REM PT RTRN 9FT ADLT (ELECTROSURGICAL) ×1 IMPLANT
EVACUATOR 1/8 PVC DRAIN (DRAIN) IMPLANT
GAUZE 4X4 16PLY ~~LOC~~+RFID DBL (SPONGE) ×2 IMPLANT
GLOVE EXAM NITRILE XL STR (GLOVE) IMPLANT
GLOVE SURG ENC MOIS LTX SZ8 (GLOVE) ×4 IMPLANT
GLOVE SURG ENC MOIS LTX SZ8.5 (GLOVE) ×4 IMPLANT
GLOVE SURG LTX SZ6.5 (GLOVE) ×2 IMPLANT
GLOVE SURG LTX SZ7.5 (GLOVE) ×2 IMPLANT
GLOVE SURG UNDER POLY LF SZ6.5 (GLOVE) ×2 IMPLANT
GLOVE SURG UNDER POLY LF SZ7 (GLOVE) ×2 IMPLANT
GLOVE SURG UNDER POLY LF SZ7.5 (GLOVE) ×2 IMPLANT
GOWN STRL REUS W/ TWL LRG LVL3 (GOWN DISPOSABLE) ×2 IMPLANT
GOWN STRL REUS W/ TWL XL LVL3 (GOWN DISPOSABLE) ×3 IMPLANT
GOWN STRL REUS W/TWL 2XL LVL3 (GOWN DISPOSABLE) IMPLANT
GOWN STRL REUS W/TWL LRG LVL3 (GOWN DISPOSABLE) ×2
GOWN STRL REUS W/TWL XL LVL3 (GOWN DISPOSABLE) ×3
HEMOSTAT POWDER KIT SURGIFOAM (HEMOSTASIS) ×2 IMPLANT
KIT BASIN OR (CUSTOM PROCEDURE TRAY) ×2 IMPLANT
KIT GRAFTMAG DEL NEURO DISP (NEUROSURGERY SUPPLIES) ×2 IMPLANT
KIT TURNOVER KIT B (KITS) ×2 IMPLANT
MILL MEDIUM DISP (BLADE) ×2 IMPLANT
NEEDLE HYPO 21X1.5 SAFETY (NEEDLE) ×2 IMPLANT
NEEDLE HYPO 22GX1.5 SAFETY (NEEDLE) ×2 IMPLANT
NS IRRIG 1000ML POUR BTL (IV SOLUTION) ×2 IMPLANT
OIL CARTRIDGE MAESTRO DRILL (MISCELLANEOUS) ×2
PACK LAMINECTOMY NEURO (CUSTOM PROCEDURE TRAY) ×2 IMPLANT
PAD ARMBOARD 7.5X6 YLW CONV (MISCELLANEOUS) ×6 IMPLANT
PATTIES SURGICAL .5 X1 (DISPOSABLE) IMPLANT
PUTTY DBM 10CC CALC GRAN (Putty) ×2 IMPLANT
ROD REVERE 7.5MM (Rod) ×4 IMPLANT
SCREW 7.5X50MM (Screw) ×4 IMPLANT
SPONGE NEURO XRAY DETECT 1X3 (DISPOSABLE) IMPLANT
SPONGE SURGIFOAM ABS GEL 100 (HEMOSTASIS) IMPLANT
SPONGE T-LAP 4X18 ~~LOC~~+RFID (SPONGE) ×2 IMPLANT
STRIP CLOSURE SKIN 1/2X4 (GAUZE/BANDAGES/DRESSINGS) ×2 IMPLANT
SUT VIC AB 1 CT1 18XBRD ANBCTR (SUTURE) ×2 IMPLANT
SUT VIC AB 1 CT1 8-18 (SUTURE) ×2
SUT VIC AB 2-0 CP2 18 (SUTURE) ×4 IMPLANT
SYR 20ML LL LF (SYRINGE) IMPLANT
TOWEL GREEN STERILE (TOWEL DISPOSABLE) ×2 IMPLANT
TOWEL GREEN STERILE FF (TOWEL DISPOSABLE) ×2 IMPLANT
TRAY FOLEY MTR SLVR 14FR STAT (SET/KITS/TRAYS/PACK) ×2 IMPLANT
TRAY FOLEY MTR SLVR 16FR STAT (SET/KITS/TRAYS/PACK) IMPLANT
WATER STERILE IRR 1000ML POUR (IV SOLUTION) ×2 IMPLANT

## 2021-05-22 NOTE — Anesthesia Postprocedure Evaluation (Signed)
Anesthesia Post Note  Patient: Crystal Oneal  Procedure(s) Performed: POSTERIOR LUMBAR INTERBODY FUSION ,INSTRUMENTED PROTHESIS ,POSTERIOR INSTRUMENTATION LUMBAR THREE-FOUR; EXPLORE PREVIOUS FUSION     Patient location during evaluation: PACU Anesthesia Type: General Level of consciousness: sedated, patient cooperative and oriented Pain management: pain level controlled Vital Signs Assessment: post-procedure vital signs reviewed and stable Respiratory status: spontaneous breathing, nonlabored ventilation and respiratory function stable Cardiovascular status: blood pressure returned to baseline and stable Postop Assessment: no apparent nausea or vomiting Anesthetic complications: no   No notable events documented.  Last Vitals:  Vitals:   05/22/21 1200 05/22/21 1215  BP: 136/77 137/77  Pulse: 74 85  Resp: 14 14  Temp:    SpO2: 100% 97%    Last Pain:  Vitals:   05/22/21 1215  TempSrc:   PainSc: 6                  Ilse Billman,E. Ayaana Biondo

## 2021-05-22 NOTE — Progress Notes (Signed)
Orthopedic Tech Progress Note Patient Details:  Crystal Oneal 04/19/1951 [id number]  Patient ID: Crystal Oneal, female   DOB: [birth year], 70 y.o.   MRN: [id number] Pt's husband brought back brace from home. Crystal Oneal 05/22/2021, 1:41 PM

## 2021-05-22 NOTE — Transfer of Care (Signed)
Immediate Anesthesia Transfer of Care Note  Patient: Crystal Oneal  Procedure(s) Performed: POSTERIOR LUMBAR INTERBODY FUSION ,INSTRUMENTED PROTHESIS ,POSTERIOR INSTRUMENTATION LUMBAR THREE-FOUR; EXPLORE PREVIOUS FUSION  Patient Location: PACU  Anesthesia Type:General  Level of Consciousness: drowsy, patient cooperative and responds to stimulation  Airway & Oxygen Therapy: Patient Spontanous Breathing and Patient connected to face mask oxygen  Post-op Assessment: Report given to RN, Post -op Vital signs reviewed and stable and Patient moving all extremities X 4  Post vital signs: Reviewed and stable  Last Vitals:  Vitals Value Taken Time  BP 149/80 05/22/21 1116  Temp    Pulse 51   Resp 14   SpO2 100   Vitals shown include unvalidated device data.  Last Pain:  Vitals:   05/22/21 0625  TempSrc:   PainSc: 6       Patients Stated Pain Goal: 3 (61/51/83 4373)  Complications: No notable events documented.

## 2021-05-22 NOTE — H&P (Signed)
Subjective: Crystal Oneal is a 70 year old white female on whom I previously performed a L4-5 decompression, instrumentation and fusion in 2019.  Crystal Oneal has done well until recently when she developed back and bilateral leg pain.  She failed medical management.  She was worked up with lumbar x-rays and lumbar MRI which demonstrated adjacent segment disease with spinal stenosis.  I discussed Crystal various treatment options with her.  She has decided proceed with surgery.  Past Medical History:  Diagnosis Date   Age-related osteoporosis without current pathological fracture    Arthritis    Cancer ([place]) 04/2020   SCC skin cancer removed from nose   Cervical stenosis of spine    C11-12   Dysphagia    Esophageal dilation x2   Essential (primary) hypertension    GERD (gastroesophageal reflux disease)    Hypertension    Mixed hyperlipidemia    Paroxysmal SVT (supraventricular tachycardia) (HCC)    PONV (postoperative nausea and vomiting)    Spondylolisthesis of lumbar region     Past Surgical History:  Procedure Laterality Date   ABDOMINAL HYSTERECTOMY     ANTERIOR AND POSTERIOR REPAIR N/A 03/20/2016   Procedure: REPAIR CYSTOCELE AND RECTOCELE;  Surgeon: Bjorn Loser, MD;  Location: [facility];  Service: Urology;  Laterality: N/A;   BACK SURGERY  2019   has hardware   COLONOSCOPY     ESOPHAGEAL DILATION  2005   x 2   MOHS SURGERY  04/2020   SCC skin cancer removed from nose   VAGINAL HYSTERECTOMY Bilateral 03/20/2016   Procedure: TOTAL VAGINAL HYSTERECTOMY WITH BILATERAL SALPINGO OOPHORECTOMY mccall coloplasty;  Surgeon: Servando Salina, MD;  Location: [facility];  Service: Gynecology;  Laterality: Bilateral;   VAGINAL PROLAPSE REPAIR N/A 03/20/2016   Procedure: REPAIR VAULT PROLAPSE AND GRAFT;  Surgeon: Bjorn Loser, MD;  Location: [facility];  Service: Urology;  Laterality: N/A;    Allergies  Allergen Reactions   Pneumovax 23 [Pneumococcal Vac Polyvalent] Palpitations    tachycardia    Betadine [Povidone-Iodine] Other (See Comments)    "Pt reports that she had angry red blisters all over her back after a procedure that scrubbed with betadine in 2019."   Lisinopril Cough   Sulfa Antibiotics Rash    Social History   Tobacco Use   Smoking status: Never   Smokeless tobacco: Never  Substance Use Topics   Alcohol use: Yes    Alcohol/week: 1.0 standard drink    Types: 1 Glasses of wine per week    Comment: rarely    Family History  Problem Relation Age of Onset   Uterine cancer Mother    Breast cancer Mother    Lung disease Father    Breast cancer Sister    Multiple myeloma Brother    Kidney cancer Brother    Prior to Admission medications   Medication Sig Start Date End Date Taking? Authorizing Provider  acetaminophen (TYLENOL) 500 MG tablet Take 1,000 mg by mouth 2 (two) times daily as needed (pain.).   Yes [provider]  alendronate (FOSAMAX) 70 MG tablet Take 1 tablet (70 mg total) by mouth every Friday. Take with a full glass of water on an empty stomach. Oneal taking differently: Take 70 mg by mouth every Monday. Take with a full glass of water on an empty stomach. 06/17/20  Yes Rip Harbour, NP  aspirin EC 81 MG tablet Take 81 mg by mouth every evening.   Yes [provider]  b complex vitamins tablet Take  1 tablet by mouth every evening.   Yes [provider]  calcium citrate-vitamin D (CITRACAL+D) 315-200 MG-UNIT tablet Take 2 tablets by mouth 2 (two) times daily.   Yes [provider]  cetirizine (ZYRTEC) 10 MG tablet Take 5-10 mg by mouth daily as needed for allergies (during spring months).   Yes [provider]  Cholecalciferol (VITAMIN D) 2000 units tablet Take 2,000 Units by mouth in Crystal morning.   Yes [provider]  famotidine (PEPCID) 40 MG tablet Take 1 tablet (40 mg total) by mouth daily. 12/28/20  Yes Cox, Kirsten, MD  fluticasone [facility]) 50 MCG/ACT nasal spray Place 1 spray into both  nostrils daily as needed for allergies or rhinitis.   Yes [provider]  Ginger, Zingiber officinalis, (GINGER ROOT) 550 MG CAPS Take 1 capsule by mouth in Crystal morning and at bedtime.   Yes [provider]  Lidocaine 4 % PTCH Place 1 patch onto Crystal skin daily as needed (hip/back pain.).   Yes [provider]  losartan (COZAAR) 50 MG tablet TAKE ONE (1) TABLET BY MOUTH EVERY DAY Oneal taking differently: Take 50 mg by mouth every evening. 03/27/21  Yes Cox, Kirsten, MD  melatonin 1 MG TABS tablet Take 1 mg by mouth at bedtime.   Yes [provider]  metoprolol succinate (TOPROL-XL) 25 MG 24 hr tablet Take 12.5 mg by mouth daily.   Yes [provider]  Olopatadine HCl (PATADAY OP) Place 1 drop into both eyes daily as needed (allergy).   Yes [provider]  Omega-3 Fatty Acids (FISH OIL PO) Take 2,400 mg by mouth in Crystal morning.   Yes [provider]  trolamine salicylate (ASPERCREME) 10 % cream Apply 1 application topically as needed for muscle pain (hip/back pain.).   Yes [provider]  TURMERIC CURCUMIN PO Take 1,000 mg by mouth in Crystal morning.   Yes [provider]  vitamin B-12 (CYANOCOBALAMIN) 1000 MCG tablet Take 1,000 mcg by mouth in Crystal morning.   Yes [provider]  vitamin C (ASCORBIC ACID) 250 MG tablet Take 250 mg by mouth 2 (two) times daily.   Yes [provider]  ibuprofen (ADVIL) 200 MG tablet Take 400 mg by mouth every 4 (four) hours as needed (for pain).    [provider]     Review of Systems  Positive ROS: As above  All other systems have been reviewed and were otherwise negative with Crystal exception of those mentioned in Crystal HPI and as above.  Objective: Vital signs in last 24 hours: Temp:  [98.8 F (37.1 C)] 98.8 F (37.1 C) (10/10 0602) Pulse Rate:  [89] 89 (10/10 0602) Resp:  [18] 18 (10/10 0602) BP: (163)/(89) 163/89 (10/10 0602) SpO2:  [98 %] 98 %  (10/10 0602) Weight:  [53.5 kg] 53.5 kg (10/10 0602) Estimated body mass index is 22.3 kg/m as calculated from Crystal following:   Height as of this encounter: 5' 1"  (1.549 m).   Weight as of this encounter: 53.5 kg.   General Appearance: Alert Head: Normocephalic, without obvious abnormality, atraumatic Eyes: PERRL, conjunctiva/corneas clear, EOM's intact,    Ears: Normal  Throat: Normal  Neck: Supple, Back: Crystal Oneal's lumbar incision is well-healed. Lungs: Clear to auscultation bilaterally, respirations unlabored Heart: Regular rate and rhythm, no murmur, rub or gallop Abdomen: Soft, non-tender Extremities: Extremities normal, atraumatic, no cyanosis or edema Skin: unremarkable  NEUROLOGIC:   Mental status: alert and oriented,Motor Exam - grossly normal  Sensory Exam - grossly normal Reflexes:  Coordination - grossly normal Gait - grossly normal Balance - grossly normal Cranial Nerves: I: smell Not tested  II: visual acuity  OS: Normal  OD: Normal   II: visual fields Full to confrontation  II: pupils Equal, round, reactive to light  III,VII: ptosis None  III,IV,VI: extraocular muscles  Full ROM  V: mastication Normal  V: facial light touch sensation  Normal  V,VII: corneal reflex  Present  VII: facial muscle function - upper  Normal  VII: facial muscle function - lower Normal  VIII: hearing Not tested  IX: soft palate elevation  Normal  IX,X: gag reflex Present  XI: trapezius strength  5/5  XI: sternocleidomastoid strength 5/5  XI: neck flexion strength  5/5  XII: tongue strength  Normal    Data Review Lab Results  Component Value Date   WBC 4.1 05/08/2021   HGB 12.7 05/08/2021   HCT 38.9 05/08/2021   MCV 89 05/08/2021   PLT 254 05/08/2021   Lab Results  Component Value Date   NA 141 05/08/2021   K 4.5 05/08/2021   CL 103 05/08/2021   CO2 23 05/08/2021   BUN 12 05/08/2021   CREATININE 0.80 05/08/2021   GLUCOSE 91 05/08/2021   Lab Results   Component Value Date   INR 0.95 03/15/2016    Assessment/Plan: 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  I have reviewed her imaging studies with her and pointed out Crystal abnormalities.  We have discussed Crystal various treatment options including surgery.  I have described Crystal surgical treatment option of an L3-4 decompression, instrumentation and fusion.  I have shown her surgical models.  I have given her a surgical pamphlet.  We have discussed Crystal risk, benefits, alternatives, expected postoperative course, and likelihood of achieving our goals with surgery.  I have answered all her questions.  She has decided proceed with surgery.   Ophelia Charter 05/22/2021 7:24 AM

## 2021-05-22 NOTE — Op Note (Signed)
Brief history: The patient is a 70 year old white female on whom I performed a lumbar fusion in 2019.  She initially did well but has developed recurrent back, bilateral buttock and leg pain.  She failed medical management and worked up with a lumbar MRI and lumbar x-rays which demonstrated adjacent segment disease with spinal stenosis.  I discussed the various treatment options.  She has decided proceed with surgery.  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 Etc. requiring a wide decompression of the nerve roots.);  L3-4 transforaminal lumbar interbody fusion with local morselized autograft bone and Zimmer DBM; insertion of interbody prosthesis at L3-4 (globus peek expandable interbody prosthesis); posterior segmental instrumentation from L3 to L5 with globus titanium pedicle screws and rods; posterior lateral arthrodesis at L3-4 with local morselized autograft bone and Zimmer DBM; exploration of lumbar fusion/removal of lumbar hardware  Surgeon: Dr. Earle Gell  Asst.: Dr. Ashok Pall and Arnetha Massy, NP  Anesthesia: Gen. endotracheal  Estimated blood loss: 200 cc  Drains: None  Complications: None  Description of procedure: The patient was brought to the operating room by the anesthesia team. General endotracheal anesthesia was induced. The patient was turned to the prone position on the Wilson frame. The patient's lumbosacral region was then prepared with Betadine scrub and Betadine  solution. Sterile drapes were applied.  I then injected the area to be incised with Marcaine with epinephrine solution. I then used the scalpel to make a linear midline incision over the L3-4 and L4-5 interspace. I then used electrocautery to perform a bilateral subperiosteal dissection exposing the spinous process and lamina of L3, L4 and L5, and exposing the old hardware at L4-5.  We then inserted the Verstrac retractor to provide exposure.  We explored the fusion by removing the caps from the old screws and then removing the rods.  I inspected the arthrodesis at L4-5.  It appeared solid.  I began the decompression by using the high speed drill to perform laminotomies at L3-4 bilaterally. We then used the Kerrison punches to widen the laminotomy and removed the ligamentum flavum at L3-4 bilaterally. We used the Kerrison punches to remove the medial facets at L3-4 bilaterally, we removed the left L4 facet. We performed wide foraminotomies about the bilateral L3 and L4 nerve roots completing the decompression.  We now turned our attention to the posterior lumbar interbody fusion. I used a scalpel to incise the intervertebral disc at L3-4 bilaterally. I then performed a partial intervertebral discectomy at L3-4 bilaterally using the pituitary forceps. We prepared the vertebral endplates at N2-7 bilaterally for the fusion by removing the soft tissues with the curettes. We then used the trial spacers to pick the appropriate sized interbody prosthesis. We prefilled his prosthesis with a combination of local morselized autograft bone that we obtained during the decompression as well as Zimmer DBM. We inserted the prefilled prosthesis into the interspace at L3-4 from the left, we then turned and expanded the prosthesis. There was a good snug fit of the prosthesis in the interspace. We then filled and the remainder of the intervertebral disc space with local morselized  autograft bone and Zimmer DBM. This completed  the posterior lumbar interbody arthrodesis.  During the decompression and insertion of the prosthesis the assistant protected the thecal sac and nerve roots with the D'Errico retractor.  We now turned attention to the instrumentation. Under fluoroscopic guidance we cannulated the bilateral L3 pedicles with the bone probe. We then removed the bone probe. We then tapped the pedicle with a 6.5 millimeter tap. We then removed the tap. We probed inside the tapped pedicle with a ball probe to rule out cortical breaches. We then inserted a 7.5 x 55 millimeter pedicle screw into the L3 pedicles bilaterally under fluoroscopic guidance. We then palpated along the medial aspect of the pedicles to rule out cortical breaches. There were none. The nerve roots were not injured. We then connected the unilateral pedicle screws with a lordotic rod. We compressed the construct and secured the rod in place with the caps. We then tightened the caps appropriately. This completed the instrumentation from L3-L5 bilaterally.  We now turned our attention to the posterior lateral arthrodesis at L3-4. We used the high-speed drill to decorticate the remainder of the facets, pars, transverse process at L3-4. We then applied a combination of local morselized autograft bone and Zimmer DBM over these decorticated posterior lateral structures. This completed the posterior lateral arthrodesis.  We then obtained hemostasis using bipolar electrocautery. We irrigated the wound out with bacitracin solution. We inspected the thecal sac and nerve roots and noted they were well decompressed. We then removed the retractor.  We injected Exparel . We reapproximated patient's thoracolumbar fascia with interrupted #1 Vicryl suture. We reapproximated patient's subcutaneous tissue with interrupted 2-0 Vicryl suture. The reapproximated patient's skin with Steri-Strips and benzoin. The wound was then coated with bacitracin ointment. A sterile dressing was  applied. The drapes were removed. The patient was subsequently returned to the supine position where they were extubated by the anesthesia team. He was then transported to the post anesthesia care unit in stable condition. All sponge instrument and needle counts were reportedly correct at the end of this case.

## 2021-05-22 NOTE — Anesthesia Procedure Notes (Signed)
Procedure Name: Intubation Date/Time: 05/22/2021 7:45 AM Performed by: Amadeo Garnet, CRNA Pre-anesthesia Checklist: Patient identified, Emergency Drugs available, Suction available and Patient being monitored Patient Re-evaluated:Patient Re-evaluated prior to induction Oxygen Delivery Method: Circle system utilized Preoxygenation: Pre-oxygenation with 100% oxygen Induction Type: IV induction Ventilation: Mask ventilation without difficulty Laryngoscope Size: Mac and 4 Grade View: Grade I Tube type: Oral Tube size: 7.0 mm Number of attempts: 1 Airway Equipment and Method: Stylet and Oral airway Placement Confirmation: ETT inserted through vocal cords under direct vision, positive ETCO2 and breath sounds checked- equal and bilateral Secured at: 22 cm Tube secured with: Tape Dental Injury: Teeth and Oropharynx as per pre-operative assessment

## 2021-05-22 NOTE — Evaluation (Signed)
Occupational Therapy Evaluation and Discharge Patient Details Name: Crystal Oneal MRN: [id number] DOB: 06-08-1951 Today's Date: 05/22/2021   History of Present Illness Pt is a 70 yo female s/p L3-4 transforaminal lumbar interbody fusion,posterior segmental instrumentation from L3 to L5, and posterior lateral arthrodesis at L3-4   Clinical Impression   This 70 yo female admitted and underwent above presents to acute OT with all education completed with pt and husband. Acute OT will sign off.      Recommendations for follow up therapy are one component of a multi-disciplinary discharge planning process, led by the attending physician.  Recommendations may be updated based on patient status, additional functional criteria and insurance authorization.   Follow Up Recommendations  No OT follow up;Supervision - Intermittent          Precautions / Restrictions Precautions Precautions: Back Precaution Booklet Issued: Yes (comment) Required Braces or Orthoses: Spinal Brace Spinal Brace: Lumbar corset;Applied in sitting position Restrictions Weight Bearing Restrictions: No      Mobility Bed Mobility Overal bed mobility: Needs Assistance Bed Mobility: Sit to Sidelying         Sit to sidelying: Supervision General bed mobility comments: VCs for technique    Transfers Overall transfer level: Needs assistance Equipment used: None Transfers: Sit to/from Stand Sit to Stand: Supervision         General transfer comment: VCs for stance    Balance Overall balance assessment: Mild deficits observed, not formally tested                                         ADL either performed or assessed with clinical judgement   ADL                                         General ADL Comments: Educated on use of reacher to doff socks and sock aid to donn socks, pt returned demonstrated--handout of AE provided; side step into tub (pt verbalized  understanding, sequence of dressing that is more efficient(pt verbalized understanding), using two cups for brushing teeth to avoid bending over the sink, (pt verbalized understanding), use of wet wipes for back peri care to avoid as much twisitng (pt verbalized understanding), pillow positiong for sleep (pt verbalized understanding),  back precautions (pt verbalized understanding), and stance for sit<>stand to keep back more straight (pt returned demonstrated).     Vision Patient Visual Report: No change from baseline              Pertinent Vitals/Pain Pain Assessment: 0-10 Pain Score: 7  Pain Location: incisional Pain Descriptors / Indicators: Aching;Sore Pain Intervention(s): Limited activity within patient's tolerance;Monitored during session;Premedicated before session;Repositioned     Hand Dominance Right   Extremity/Trunk Assessment Upper Extremity Assessment Upper Extremity Assessment: Overall WFL for tasks assessed           Communication Communication Communication: No difficulties   Cognition Arousal/Alertness: Awake/alert Behavior During Therapy: WFL for tasks assessed/performed Overall Cognitive Status: Within Functional Limits for tasks assessed                                                Home Living Family/patient  expects to be discharged to:: Private residence Living Arrangements: Spouse/significant other Available Help at Discharge: Family;Available 24 hours/day Type of Home: House Home Access: Ramped entrance (or 2 steps)     Home Layout: One level     Bathroom Shower/Tub: Tub/shower unit;Curtain   Biochemist, clinical: Standard     Home Equipment: Hand held shower head          Prior Functioning/Environment Level of Independence: Independent                 OT Problem List: Decreased range of motion;Pain         OT Goals(Current goals can be found in the care plan section) Acute Rehab OT Goals Patient Stated  Goal: to go home tomorrow   AM-PAC OT "6 Clicks" Daily Activity     Outcome Measure Help from another person eating meals?: None Help from another person taking care of personal grooming?: A Little (S) Help from another person toileting, which includes using toliet, bedpan, or urinal?: A Little (S) Help from another person bathing (including washing, rinsing, drying)?: A Little Help from another person to put on and taking off regular upper body clothing?:  (S) Help from another person to put on and taking off regular lower body clothing?:  (S, as to use AE) 6 Click Score: 13   End of Session    Activity Tolerance: Patient tolerated treatment well Patient left: in bed;with call bell/phone within reach;with family/visitor present  OT Visit Diagnosis: Pain Pain - part of body:  (incisional)                Time: 8657-8469 OT Time Calculation (min): 26 min Charges:  OT General Charges $OT Visit: 1 Visit OT Evaluation $OT Eval Moderate Complexity: 1 Mod OT Treatments $Self Care/Home Management : 8-22 mins  Golden Circle, OTR/L Acute Rehab Services Pager [phone] Office [phone]    Almon Register 05/22/2021, 5:17 PM

## 2021-05-23 LAB — CBC
HCT: 30.8 % — ABNORMAL LOW (ref 36.0–46.0)
Hemoglobin: 10.1 g/dL — ABNORMAL LOW (ref 12.0–15.0)
MCH: 29.4 pg (ref 26.0–34.0)
MCHC: 32.8 g/dL (ref 30.0–36.0)
MCV: 89.5 fL (ref 80.0–100.0)
Platelets: 195 10*3/uL (ref 150–400)
RBC: 3.44 MIL/uL — ABNORMAL LOW (ref 3.87–5.11)
RDW: 13.4 % (ref 11.5–15.5)
WBC: 10.8 10*3/uL — ABNORMAL HIGH (ref 4.0–10.5)
nRBC: 0 % (ref 0.0–0.2)

## 2021-05-23 LAB — BASIC METABOLIC PANEL
Anion gap: 7 (ref 5–15)
BUN: 11 mg/dL (ref 8–23)
CO2: 26 mmol/L (ref 22–32)
Calcium: 9 mg/dL (ref 8.9–10.3)
Chloride: 101 mmol/L (ref 98–111)
Creatinine, Ser: 0.83 mg/dL (ref 0.44–1.00)
GFR, Estimated: >60 mL/min (ref >60)
Glucose, Bld: 121 mg/dL — ABNORMAL HIGH (ref 70–99)
Potassium: 4.3 mmol/L (ref 3.5–5.1)
Sodium: 134 mmol/L — ABNORMAL LOW (ref 135–145)

## 2021-05-23 MED ORDER — OXYCODONE-ACETAMINOPHEN 5-325 MG PO TABS: 1.0000 | ORAL_TABLET | ORAL | Status: DC | PRN

## 2021-05-23 MED ORDER — OXYCODONE-ACETAMINOPHEN 5-325 MG PO TABS: 1.0000 | ORAL_TABLET | ORAL | 0 refills | Status: DC | PRN

## 2021-05-23 MED ORDER — DOCUSATE SODIUM 100 MG PO CAPS: 100.0000 mg | ORAL_CAPSULE | Freq: Two times a day (BID) | ORAL | 0 refills | Status: DC

## 2021-05-23 NOTE — Discharge Summary (Signed)
Physician Discharge Summary  Patient ID: Crystal Oneal MRN: [id number] DOB/AGE: [birth year] 70 y.o.  Admit date: 05/22/2021 Discharge date: 05/23/2021  Admission Diagnoses: Lumbar adjacent disease with spondylolisthesis, lumbar spinal stenosis, lumbar facet arthropathy, lumbago, lumbar radiculopathy, neurogenic claudication  Discharge Diagnoses: The same Active Problems:   Lumbar adjacent segment disease with spondylolisthesis   Discharged Condition: good  Hospital Course: I performed an exploration of patient's lumbar fusion with extension to L3-4 on 05/22/2021.  The surgery went well.  The patient's postoperative course was unremarkable.  On postoperative day #1 she requested discharge home.  The patient, and her husband, were given verbal and written discharge instructions.  All her questions were answered.  Consults: PT, OT, care management Significant Diagnostic Studies: None Treatments: Exploration of lumbar fusion, L3-4 decompression, instrumentation and fusion. Discharge Exam: Blood pressure 120/89, pulse 67, temperature 98.8 F (37.1 C), temperature source Oral, resp. rate 16, height 5\' 1"  (1.549 m), weight 53.5 kg, SpO2 99 %. The patient is alert and pleasant.  She looks well.  Her strength is normal.  Disposition: Home  Discharge Instructions     Call MD for:  difficulty breathing, headache or visual disturbances   Complete by: As directed    Call MD for:  extreme fatigue   Complete by: As directed    Call MD for:  hives   Complete by: As directed    Call MD for:  persistant dizziness or light-headedness   Complete by: As directed    Call MD for:  persistant nausea and vomiting   Complete by: As directed    Call MD for:  redness, tenderness, or signs of infection (pain, swelling, redness, odor or green/yellow discharge around incision site)   Complete by: As directed    Call MD for:  severe uncontrolled pain   Complete by: As directed    Call MD for:   temperature >100.4   Complete by: As directed    Diet - low sodium heart healthy   Complete by: As directed    Discharge instructions   Complete by: As directed    Call [phone] for a followup appointment. Take a stool softener while you are using pain medications.   Driving Restrictions   Complete by: As directed    Do not drive for 2 weeks.   Increase activity slowly   Complete by: As directed    Lifting restrictions   Complete by: As directed    Do not lift more than 5 pounds. No excessive bending or twisting.   May shower / Bathe   Complete by: As directed    Remove the dressing for 3 days after surgery.  You may shower, but leave the incision alone.   Remove dressing in 48 hours   Complete by: As directed       Allergies as of 05/23/2021       Reactions   Pneumovax 23 [pneumococcal Vac Polyvalent] Palpitations   tachycardia   Betadine [povidone-iodine] Other (See Comments)   "Pt reports that she had angry red blisters all over her back after a procedure that scrubbed with betadine in 2019."   Lisinopril Cough   Sulfa Antibiotics Rash        Medication List     STOP taking these medications    acetaminophen 500 MG tablet Commonly known as: TYLENOL   ibuprofen 200 MG tablet Commonly known as: ADVIL       TAKE these medications    alendronate 70 MG tablet Commonly  known as: FOSAMAX Take 1 tablet (70 mg total) by mouth every Friday. Take with a full glass of water on an empty stomach. What changed: when to take this   aspirin EC 81 MG tablet Take 81 mg by mouth every evening.   b complex vitamins tablet Take 1 tablet by mouth every evening.   calcium citrate-vitamin D 315-200 MG-UNIT tablet Commonly known as: CITRACAL+D Take 2 tablets by mouth 2 (two) times daily.   cetirizine 10 MG tablet Commonly known as: ZYRTEC Take 5-10 mg by mouth daily as needed for allergies (during spring months).   docusate sodium 100 MG capsule Commonly known as:  COLACE Take 1 capsule (100 mg total) by mouth 2 (two) times daily.   famotidine 40 MG tablet Commonly known as: PEPCID Take 1 tablet (40 mg total) by mouth daily.   FISH OIL PO Take 2,400 mg by mouth in the morning.   fluticasone 50 MCG/ACT nasal spray Commonly known as: FLONASE Place 1 spray into both nostrils daily as needed for allergies or rhinitis.   Ginger Root 550 MG Caps Take 1 capsule by mouth in the morning and at bedtime.   Lidocaine 4 % Ptch Place 1 patch onto the skin daily as needed (hip/back pain.).   losartan 50 MG tablet Commonly known as: COZAAR TAKE ONE (1) TABLET BY MOUTH EVERY DAY What changed:  how much to take when to take this   melatonin 1 MG Tabs tablet Take 1 mg by mouth at bedtime.   metoprolol succinate 25 MG 24 hr tablet Commonly known as: TOPROL-XL Take 12.5 mg by mouth daily.   oxyCODONE-acetaminophen 5-325 MG tablet Commonly known as: PERCOCET/ROXICET Take 1-2 tablets by mouth every 4 (four) hours as needed for moderate pain.   PATADAY OP Place 1 drop into both eyes daily as needed (allergy).   trolamine salicylate 10 % cream Commonly known as: ASPERCREME Apply 1 application topically as needed for muscle pain (hip/back pain.).   TURMERIC CURCUMIN PO Take 1,000 mg by mouth in the morning.   vitamin B-12 1000 MCG tablet Commonly known as: CYANOCOBALAMIN Take 1,000 mcg by mouth in the morning.   vitamin C 250 MG tablet Commonly known as: ASCORBIC ACID Take 250 mg by mouth 2 (two) times daily.   Vitamin D 50 MCG (2000 UT) tablet Take 2,000 Units by mouth in the morning.         Signed: Ophelia Charter 05/23/2021, 7:49 AM

## 2021-05-23 NOTE — Plan of Care (Signed)
Patient alert and oriented, Crystal Oneal's well, voiding adequate amount of urine, swallowing without difficulty, no c/o pain at time of discharge. Patient discharged home with family. Script and discharged instructions given to patient. Patient and family stated understanding of instructions given. Patient has an appointment with Dr. Jenkins in 2 weeks 

## 2021-05-23 NOTE — Evaluation (Signed)
Physical Therapy Evaluation and Discharge Patient Details Name: Crystal Oneal MRN: [id number] DOB: 1951/06/21 Today's Date: 05/23/2021  History of Present Illness  Pt is a 70 y/o female who presents s/p L3-4 TLIF on 05/22/2021.  Clinical Impression  Patient evaluated by Physical Therapy with no further acute PT needs identified. All education has been completed and the patient has no further questions. Pt was able to demonstrate transfers and ambulation with gross modified independence and no AD. Pt was educated on precautions, brace application/wearing schedule, appropriate activity progression, and car transfer. See below for any follow-up Physical Therapy or equipment needs. PT is signing off. Thank you for this referral.       Recommendations for follow up therapy are one component of a multi-disciplinary discharge planning process, led by the attending physician.  Recommendations may be updated based on patient status, additional functional criteria and insurance authorization.  Follow Up Recommendations No PT follow up;Supervision - Intermittent    Equipment Recommendations  None recommended by PT    Recommendations for Other Services       Precautions / Restrictions Precautions Precautions: Back Precaution Booklet Issued: Yes (comment) Required Braces or Orthoses: Spinal Brace Spinal Brace: Lumbar corset;Applied in sitting position Restrictions Weight Bearing Restrictions: No      Mobility  Bed Mobility Overal bed mobility: Modified Independent Bed Mobility: Rolling;Sidelying to Sit           General bed mobility comments: HOB flat and rails lowered to simulate home environment. No assist required.    Transfers Overall transfer level: Modified independent Equipment used: None Transfers: Sit to/from Stand           General transfer comment: No assist to power up to full stand. Pt demonstrated proper hand placement on seated surface for  safety.  Ambulation/Gait Ambulation/Gait assistance: Modified independent (Device/Increase time) Gait Distance (Feet): 475 Feet Assistive device: None Gait Pattern/deviations: Step-through pattern;Decreased stride length Gait velocity: Decreased Gait velocity interpretation: 1.31 - 2.62 ft/sec, indicative of limited community ambulator General Gait Details: Slow and guarded but without unsteadiness or LOB noted.  Stairs Stairs: Yes Stairs assistance: Min guard Stair Management: One rail Left;Forwards;Step to pattern;Alternating pattern Number of Stairs: 10 General stair comments: Step-to pattern to ascend and alternating step pattern for descent.  Wheelchair Mobility    Modified Rankin (Stroke Patients Only)       Balance Overall balance assessment: Mild deficits observed, not formally tested                                           Pertinent Vitals/Pain Pain Assessment: Faces Faces Pain Scale: Hurts even more Pain Location: incisional Pain Descriptors / Indicators: Sore;Operative site guarding    Home Living Family/patient expects to be discharged to:: Private residence Living Arrangements: Spouse/significant other Available Help at Discharge: Family;Available 24 hours/day Type of Home: House Home Access: Ramped entrance (or 2 steps)     Home Layout: One level Home Equipment: Hand held shower head      Prior Function Level of Independence: Independent               Hand Dominance   Dominant Hand: Right    Extremity/Trunk Assessment   Upper Extremity Assessment Upper Extremity Assessment: Defer to OT evaluation    Lower Extremity Assessment Lower Extremity Assessment: Generalized weakness (Consistent with pre-op diagnosis)    Cervical / Trunk Assessment  Cervical / Trunk Assessment: Other exceptions Cervical / Trunk Exceptions: s/p surgery  Communication   Communication: No difficulties  Cognition Arousal/Alertness:  Awake/alert Behavior During Therapy: WFL for tasks assessed/performed Overall Cognitive Status: Within Functional Limits for tasks assessed                                        General Comments      Exercises     Assessment/Plan    PT Assessment Patent does not need any further PT services  PT Problem List         PT Treatment Interventions      PT Goals (Current goals can be found in the Care Plan section)  Acute Rehab PT Goals Patient Stated Goal: Home today PT Goal Formulation: All assessment and education complete, DC therapy    Frequency     Barriers to discharge        Co-evaluation               AM-PAC PT "6 Clicks" Mobility  Outcome Measure Help needed turning from your back to your side while in a flat bed without using bedrails?: None Help needed moving from lying on your back to sitting on the side of a flat bed without using bedrails?: None Help needed moving to and from a bed to a chair (including a wheelchair)?: None Help needed standing up from a chair using your arms (e.g., wheelchair or bedside chair)?: None Help needed to walk in hospital room?: None Help needed climbing 3-5 steps with a railing? : None 6 Click Score: 24    End of Session Equipment Utilized During Treatment: Gait belt;Back brace Activity Tolerance: Patient tolerated treatment well Patient left: Other (comment) (Sitting EOB to dress with husband present) Nurse Communication: Mobility status PT Visit Diagnosis: Unsteadiness on feet (R26.81);Pain Pain - part of body:  (back)    Time: 3545-6256 PT Time Calculation (min) (ACUTE ONLY): 23 min   Charges:   PT Evaluation $PT Eval Low Complexity: 1 Low PT Treatments $Gait Training: 8-22 mins        Rolinda Roan, PT, DPT Acute Rehabilitation Services Pager: [phone] Office: [phone]   Thelma Comp 05/23/2021, 12:19 PM

## 2021-05-25 MED FILL — Electrolyte-R (PH 7.4) Solution: INTRAVENOUS | Qty: 2000 | Status: AC

## 2021-05-25 MED FILL — Heparin Sodium (Porcine) Inj 1000 Unit/ML: INTRAMUSCULAR | Qty: 30 | Status: AC

## 2021-06-06 ENCOUNTER — Telehealth: Payer: Self-pay

## 2021-06-06 NOTE — Telephone Encounter (Signed)
Called and left message for patient to call back to discuss scheduling a hospital follow up and told patient on the message to call us back so we can set it up.

## 2021-06-20 DIAGNOSIS — M48062 Spinal stenosis, lumbar region with neurogenic claudication: Secondary | ICD-10-CM | POA: Diagnosis not present

## 2021-06-30 ENCOUNTER — Other Ambulatory Visit: Payer: Self-pay | Admitting: Family Medicine

## 2021-06-30 DIAGNOSIS — I10 Essential (primary) hypertension: Secondary | ICD-10-CM

## 2021-07-12 ENCOUNTER — Other Ambulatory Visit: Payer: Self-pay | Admitting: Nurse Practitioner

## 2021-07-12 DIAGNOSIS — M810 Age-related osteoporosis without current pathological fracture: Secondary | ICD-10-CM

## 2021-09-18 ENCOUNTER — Other Ambulatory Visit: Payer: Self-pay

## 2021-09-18 DIAGNOSIS — I10 Essential (primary) hypertension: Secondary | ICD-10-CM

## 2021-09-18 MED ORDER — LOSARTAN POTASSIUM 50 MG PO TABS: ORAL_TABLET | ORAL | 0 refills | Status: DC

## 2021-09-26 DIAGNOSIS — I10 Essential (primary) hypertension: Secondary | ICD-10-CM | POA: Diagnosis not present

## 2021-09-26 DIAGNOSIS — M5136 Other intervertebral disc degeneration, lumbar region: Secondary | ICD-10-CM | POA: Diagnosis not present

## 2021-09-26 DIAGNOSIS — Z981 Arthrodesis status: Secondary | ICD-10-CM | POA: Diagnosis not present

## 2021-11-06 ENCOUNTER — Ambulatory Visit (INDEPENDENT_AMBULATORY_CARE_PROVIDER_SITE_OTHER): Payer: PPO

## 2021-11-06 ENCOUNTER — Ambulatory Visit (INDEPENDENT_AMBULATORY_CARE_PROVIDER_SITE_OTHER): Payer: PPO | Admitting: Family Medicine

## 2021-11-06 ENCOUNTER — Other Ambulatory Visit: Payer: Self-pay

## 2021-11-06 ENCOUNTER — Encounter: Payer: Self-pay | Admitting: Family Medicine

## 2021-11-06 VITALS — BP 158/80 | HR 72 | Temp 96.2°F | Resp 14 | Ht 61.0 in | Wt 118.0 lb

## 2021-11-06 DIAGNOSIS — I471 Supraventricular tachycardia, unspecified: Secondary | ICD-10-CM

## 2021-11-06 DIAGNOSIS — Z1231 Encounter for screening mammogram for malignant neoplasm of breast: Secondary | ICD-10-CM | POA: Diagnosis not present

## 2021-11-06 DIAGNOSIS — Z981 Arthrodesis status: Secondary | ICD-10-CM | POA: Insufficient documentation

## 2021-11-06 DIAGNOSIS — E782 Mixed hyperlipidemia: Secondary | ICD-10-CM | POA: Diagnosis not present

## 2021-11-06 DIAGNOSIS — Z1211 Encounter for screening for malignant neoplasm of colon: Secondary | ICD-10-CM | POA: Diagnosis not present

## 2021-11-06 DIAGNOSIS — I10 Essential (primary) hypertension: Secondary | ICD-10-CM

## 2021-11-06 DIAGNOSIS — Z23 Encounter for immunization: Secondary | ICD-10-CM

## 2021-11-06 DIAGNOSIS — J301 Allergic rhinitis due to pollen: Secondary | ICD-10-CM

## 2021-11-06 DIAGNOSIS — R7989 Other specified abnormal findings of blood chemistry: Secondary | ICD-10-CM | POA: Diagnosis not present

## 2021-11-06 DIAGNOSIS — M810 Age-related osteoporosis without current pathological fracture: Secondary | ICD-10-CM | POA: Diagnosis not present

## 2021-11-06 DIAGNOSIS — J309 Allergic rhinitis, unspecified: Secondary | ICD-10-CM | POA: Insufficient documentation

## 2021-11-06 NOTE — Assessment & Plan Note (Signed)
Increase losartan 100 mg daily  ?Continue metoprolol 25 mg 1/2 daily. ?Recommend continue to work on eating healthy diet and exercise. ? ?

## 2021-11-06 NOTE — Assessment & Plan Note (Signed)
The current medical regimen is effective;  continue present plan and medications. ?Continue metoprolol 12.5 mg daily ?

## 2021-11-06 NOTE — Patient Instructions (Signed)
Increase losartan 100 mg daily  ?Continue metoprolol 25 mg 1/2 daily. ?Recommend continue to work on eating healthy diet and exercise. ? ?

## 2021-11-06 NOTE — Assessment & Plan Note (Signed)
Await labs/testing for assessment and recommendations. Recommend continue to work on eating healthy diet and exercise.  

## 2021-11-06 NOTE — Progress Notes (Signed)
? ?Subjective:  ?Patient ID: Crystal Oneal, female    DOB: [birth year]  Age: 71 y.o. MRN: [id number] ? ?Chief Complaint  ?Patient presents with  ? Hypertension  ? ?HPI ?Hypertension: on losartan 50 mg daily and toprol xl 25 mg 1/2 daily.  ?Hyperlipidemia: on fish oil.  ?Osteoporosis: on alendronate 70 mg once weekly. ?Allergic rhinitis/conjunctivitis: pataday, flonase, and zyrtec.  ?GERD: on pepcid 40 mg daily.  ?Anemia  after back surgery in fall.  ?Eats healthy and exercises. ? ?Current Outpatient Medications on File Prior to Visit  ?Medication Sig Dispense Refill  ? alendronate (FOSAMAX) 70 MG tablet TAKE ONE TABLET BY MOUTH EVERY FRIDAY. TAKE WITH A FULL GLASS OF WATER ON AN EMPTY STOMACH. STAY UPRIGHT FOR 30 MIN AFTER TAKING. 12 tablet 2  ? aspirin EC 81 MG tablet Take 81 mg by mouth every evening.    ? b complex vitamins tablet Take 1 tablet by mouth every evening.    ? calcium citrate-vitamin D (CITRACAL+D) 315-200 MG-UNIT tablet Take 2 tablets by mouth 2 (two) times daily.    ? cetirizine (ZYRTEC) 10 MG tablet Take 5-10 mg by mouth daily as needed for allergies (during spring months).    ? Cholecalciferol (VITAMIN D) 2000 units tablet Take 2,000 Units by mouth in the morning.    ? docusate sodium (COLACE) 100 MG capsule Take 1 capsule (100 mg total) by mouth 2 (two) times daily. 60 capsule 0  ? famotidine (PEPCID) 40 MG tablet Take 1 tablet (40 mg total) by mouth daily. 90 tablet 3  ? fluticasone (FLONASE) 50 MCG/ACT nasal spray Place 1 spray into both nostrils daily as needed for allergies or rhinitis.    ? Ginger, Zingiber officinalis, (GINGER ROOT) 550 MG CAPS Take 1 capsule by mouth in the morning and at bedtime.    ? losartan (COZAAR) 50 MG tablet TAKE ONE (1) TABLET BY MOUTH EVERY DAY 90 tablet 0  ? melatonin 1 MG TABS tablet Take 1 mg by mouth at bedtime.    ? metoprolol succinate (TOPROL-XL) 25 MG 24 hr tablet Take 12.5 mg by mouth daily.    ? Olopatadine HCl (PATADAY OP) Place 1 drop into both eyes  daily as needed (allergy).    ? Omega-3 Fatty Acids (FISH OIL PO) Take 2,400 mg by mouth in the morning.    ? TURMERIC CURCUMIN PO Take 1,000 mg by mouth in the morning.    ? vitamin B-12 (CYANOCOBALAMIN) 1000 MCG tablet Take 1,000 mcg by mouth in the morning.    ? vitamin C (ASCORBIC ACID) 250 MG tablet Take 250 mg by mouth 2 (two) times daily.    ? ?No current facility-administered medications on file prior to visit.  ? ?Past Medical History:  ?Diagnosis Date  ? Age-related osteoporosis without current pathological fracture   ? Arthritis   ? Cancer ([place]) 04/2020  ? 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 Cervical stenosis of spine   ? C11-12  ? Dysphagia   ? Esophageal dilation x2  ? Essential (primary) hypertension   ? GERD (gastroesophageal reflux disease)   ? Hypertension   ? Mixed hyperlipidemia   ? Paroxysmal SVT (supraventricular tachycardia) ([facility])   ? PONV (postoperative nausea and vomiting)   ? Spondylolisthesis of lumbar region   ? ?Past Surgical History:  ?Procedure Laterality Date  ? ABDOMINAL HYSTERECTOMY    ? ANTERIOR AND POSTERIOR REPAIR N/A 03/20/2016  ? Procedure: REPAIR CYSTOCELE AND RECTOCELE;  Surgeon: Bjorn Loser, MD;  Location: [facility];  Service: Urology;  Laterality: N/A;  ? BACK SURGERY  2019  ? has hardware  ? COLONOSCOPY    ? ESOPHAGEAL DILATION  2005  ? x 2  ? MOHS SURGERY  04/2020  ? 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 VAGINAL HYSTERECTOMY Bilateral 03/20/2016  ? Procedure: TOTAL VAGINAL HYSTERECTOMY WITH BILATERAL SALPINGO OOPHORECTOMY mccall coloplasty;  Surgeon: Servando Salina, MD;  Location: [facility];  Service: Gynecology;  Laterality: Bilateral;  ? VAGINAL PROLAPSE REPAIR N/A 03/20/2016  ? Procedure: REPAIR VAULT PROLAPSE AND GRAFT;  Surgeon: Bjorn Loser, MD;  Location: [facility];  Service: Urology;  Laterality: N/A;  ?  ?Family History  ?Problem Relation Age of Onset  ? Uterine cancer Mother   ? Breast cancer Mother   ? Lung disease Father   ? Breast cancer Sister   ? Multiple  myeloma Brother   ? Kidney cancer Brother   ? ?Social History  ? ?Socioeconomic History  ? Marital status: Married  ?  Spouse name: [name]  ? Number of children: 2  ? Years of education: Not on file  ? Highest education level: Not on file  ?Occupational History  ? Occupation: Retired  ?Tobacco Use  ? Smoking status: Never  ? Smokeless tobacco: Never  ?Vaping Use  ? Vaping Use: Never used  ?Substance and Sexual Activity  ? Alcohol use: Yes  ?  Alcohol/week: 1.0 standard drink  ?  Types: 1 Glasses of wine per week  ?  Comment: rarely  ? Drug use: Never  ? Sexual activity: Not on file  ?Other Topics Concern  ? Not on file  ?Social History Narrative  ? Not on file  ? ?Social Determinants of Health  ? ?Financial Resource Strain: Not on file  ?Food Insecurity: Not on file  ?Transportation Needs: Not on file  ?Physical Activity: Not on file  ?Stress: Not on file  ?Social Connections: Not on file  ? ? ?Review of Systems  ?Constitutional:  Negative for chills, fatigue and fever.  ?HENT:  Negative for congestion, rhinorrhea and sore throat.   ?Respiratory:  Negative for cough and shortness of breath.   ?Cardiovascular:  Negative for chest pain.  ?Gastrointestinal:  Negative for abdominal pain, constipation, diarrhea, nausea and vomiting.  ?Genitourinary:  Negative for dysuria and urgency.  ?Musculoskeletal:  Positive for arthralgias (right thumb pain) and back pain (much improved since surgery in October). Negative for myalgias.  ?Neurological:  Positive for light-headedness. Negative for dizziness, weakness and headaches.  ?Psychiatric/Behavioral:  Negative for dysphoric mood. The patient is not nervous/anxious.   ? ? ?Objective:  ?BP (!) 158/80   Pulse 72   Temp (!) 96.2 ?F (35.7 ?C)   Resp 14   Ht '5\' 1"'$  (1.549 m)   Wt 118 lb (53.5 kg)   BMI 22.30 kg/m?  ? ? ?  11/06/2021  ?  8:19 AM 11/06/2021  ?  7:52 AM 05/23/2021  ?  7:21 AM  ?BP/Weight  ?Systolic BP [phone]  ?Diastolic BP 80 80 89  ?Wt. (Lbs)  118   ?BMI  22.3  kg/m2   ? ? ?Physical Exam ?Vitals reviewed.  ?Constitutional:   ?   Appearance: Normal appearance. She is normal weight.  ?Neck:  ?   Vascular: No carotid bruit.  ?Cardiovascular:  ?   Rate and Rhythm: Normal rate and regular rhythm.  ?   Heart sounds: Normal heart sounds.  ?Pulmonary:  ?   Effort: Pulmonary effort  is normal. No respiratory distress.  ?   Breath sounds: Normal breath sounds.  ?Neurological:  ?   Mental Status: She is alert and oriented to person, place, and time.  ?Psychiatric:     ?   Mood and Affect: Mood normal.     ?   Behavior: Behavior normal.  ? ? ?Diabetic Foot Exam - Simple   ?No data filed ?  ?  ? ?Lab Results  ?Component Value Date  ? WBC 4.1 11/06/2021  ? HGB 14.1 11/06/2021  ? HCT 42.4 11/06/2021  ? PLT 208 11/06/2021  ? GLUCOSE 97 11/06/2021  ? CHOL 200 (H) 11/06/2021  ? TRIG 74 11/06/2021  ? HDL 60 11/06/2021  ? LDLCALC 127 (H) 11/06/2021  ? ALT 27 11/06/2021  ? AST 35 11/06/2021  ? NA 141 11/06/2021  ? K 5.1 11/06/2021  ? CL 105 11/06/2021  ? CREATININE 0.76 11/06/2021  ? BUN 12 11/06/2021  ? CO2 27 11/06/2021  ? TSH 4.900 (H) 11/06/2021  ? INR 0.95 03/15/2016  ? ? ?Assessment & Plan:  ? ?Problem List Items Addressed This Visit   ? ?  ? Cardiovascular and Mediastinum  ? Hypertension  ?  Increase losartan 100 mg daily  ?Continue metoprolol 25 mg 1/2 daily. ?Recommend continue to work on eating healthy diet and exercise. ? ?  ?  ? Relevant Orders  ? CBC with Differential/Platelet (Completed)  ? Comprehensive metabolic panel (Completed)  ? TSH (Completed)  ? PSVT (paroxysmal supraventricular tachycardia) ([facility])  ?  The current medical regimen is effective;  continue present plan and medications. ?Continue metoprolol 12.5 mg daily ?  ?  ?  ? Respiratory  ? Allergic rhinitis  ?  The current medical regimen is effective;  continue present plan and medications. Continue : pataday, flonase, and zyrtec.  ?  ?  ?  ? Musculoskeletal and Integument  ? Age-related osteoporosis without current  pathological fracture - Primary  ?  ? Other  ? Abnormal TSH  ? Relevant Orders  ? T4, free (Completed)  ? Mixed hyperlipidemia  ?  Await labs/testing for assessment and recommendations. ?Recommend continue to wor

## 2021-11-06 NOTE — Assessment & Plan Note (Signed)
>>  ASSESSMENT AND PLAN FOR HYPERTENSION WRITTEN ON 11/06/2021  8:18 AM BY COX, KIRSTEN, MD  Increase losartan 100 mg daily  Continue metoprolol 25 mg 1/2 daily. Recommend continue to work on eating healthy diet and exercise.

## 2021-11-06 NOTE — Assessment & Plan Note (Signed)
The current medical regimen is effective;  continue present plan and medications. Continue : pataday, flonase, and zyrtec.  ?

## 2021-11-07 LAB — COMPREHENSIVE METABOLIC PANEL
ALT: 27 IU/L (ref 0–32)
AST: 35 IU/L (ref 0–40)
Albumin/Globulin Ratio: 2 (ref 1.2–2.2)
Albumin: 4.5 g/dL (ref 3.8–4.8)
Alkaline Phosphatase: 39 IU/L — ABNORMAL LOW (ref 44–121)
BUN/Creatinine Ratio: 16 (ref 12–28)
BUN: 12 mg/dL (ref 8–27)
Bilirubin Total: 0.5 mg/dL (ref 0.0–1.2)
CO2: 27 mmol/L (ref 20–29)
Calcium: 10.1 mg/dL (ref 8.7–10.3)
Chloride: 105 mmol/L (ref 96–106)
Creatinine, Ser: 0.76 mg/dL (ref 0.57–1.00)
Globulin, Total: 2.2 g/dL (ref 1.5–4.5)
Glucose: 97 mg/dL (ref 70–99)
Potassium: 5.1 mmol/L (ref 3.5–5.2)
Sodium: 141 mmol/L (ref 134–144)
Total Protein: 6.7 g/dL (ref 6.0–8.5)
eGFR: 84 mL/min/{1.73_m2} (ref >59)

## 2021-11-07 LAB — CBC WITH DIFFERENTIAL/PLATELET
Basophils Absolute: 0 10*3/uL (ref 0.0–0.2)
Basos: 1 %
EOS (ABSOLUTE): 0.2 10*3/uL (ref 0.0–0.4)
Eos: 4 %
Hematocrit: 42.4 % (ref 34.0–46.6)
Hemoglobin: 14.1 g/dL (ref 11.1–15.9)
Immature Grans (Abs): 0 10*3/uL (ref 0.0–0.1)
Immature Granulocytes: 0 %
Lymphocytes Absolute: 1.7 10*3/uL (ref 0.7–3.1)
Lymphs: 42 %
MCH: 29.1 pg (ref 26.6–33.0)
MCHC: 33.3 g/dL (ref 31.5–35.7)
MCV: 87 fL (ref 79–97)
Monocytes Absolute: 0.6 10*3/uL (ref 0.1–0.9)
Monocytes: 15 %
Neutrophils Absolute: 1.6 10*3/uL (ref 1.4–7.0)
Neutrophils: 38 %
Platelets: 208 10*3/uL (ref 150–450)
RBC: 4.85 x10E6/uL (ref 3.77–5.28)
RDW: 12.8 % (ref 11.7–15.4)
WBC: 4.1 10*3/uL (ref 3.4–10.8)

## 2021-11-07 LAB — TSH: TSH: 4.9 u[IU]/mL — ABNORMAL HIGH (ref 0.450–4.500)

## 2021-11-07 LAB — LIPID PANEL
Chol/HDL Ratio: 3.3 ratio (ref 0.0–4.4)
Cholesterol, Total: 200 mg/dL — ABNORMAL HIGH (ref 100–199)
HDL: 60 mg/dL (ref >39)
LDL Chol Calc (NIH): 127 mg/dL — ABNORMAL HIGH (ref 0–99)
Triglycerides: 74 mg/dL (ref 0–149)
VLDL Cholesterol Cal: 13 mg/dL (ref 5–40)

## 2021-11-08 NOTE — Progress Notes (Signed)
Blood count normal.  ?Liver function normal.  ?Kidney function normal.  ?Thyroid function abnormal. Add on free T4. ?Cholesterol: LDL 127. Worsened. Recommend statin.Lipitor 10 mg before bed.  ?Recheck in 3 months fasting ?

## 2021-11-09 ENCOUNTER — Other Ambulatory Visit: Payer: Self-pay

## 2021-11-09 ENCOUNTER — Other Ambulatory Visit: Payer: Self-pay | Admitting: Family Medicine

## 2021-11-09 DIAGNOSIS — E782 Mixed hyperlipidemia: Secondary | ICD-10-CM

## 2021-11-09 DIAGNOSIS — I10 Essential (primary) hypertension: Secondary | ICD-10-CM

## 2021-11-09 DIAGNOSIS — M810 Age-related osteoporosis without current pathological fracture: Secondary | ICD-10-CM

## 2021-11-09 MED ORDER — ATORVASTATIN CALCIUM 10 MG PO TABS
10.0000 mg | ORAL_TABLET | Freq: Every day | ORAL | 1 refills | Status: DC
Start: 2021-11-09 — End: 2022-06-01

## 2021-11-09 NOTE — Progress Notes (Signed)
Per Dr Tobie Poet recommendation Lipitor 10 mg ?

## 2021-11-10 LAB — T4, FREE: Free T4: 1.28 ng/dL (ref 0.82–1.77)

## 2021-11-19 DIAGNOSIS — R7989 Other specified abnormal findings of blood chemistry: Secondary | ICD-10-CM | POA: Insufficient documentation

## 2021-12-04 ENCOUNTER — Other Ambulatory Visit: Payer: Self-pay | Admitting: Family Medicine

## 2021-12-04 DIAGNOSIS — I10 Essential (primary) hypertension: Secondary | ICD-10-CM

## 2021-12-04 NOTE — Telephone Encounter (Signed)
Refill sent to pharmacy.   

## 2021-12-25 ENCOUNTER — Encounter: Payer: Self-pay | Admitting: Family Medicine

## 2021-12-25 ENCOUNTER — Ambulatory Visit (INDEPENDENT_AMBULATORY_CARE_PROVIDER_SITE_OTHER): Payer: PPO | Admitting: Family Medicine

## 2021-12-25 VITALS — BP 132/82 | HR 71 | Temp 97.2°F | Ht 61.0 in | Wt 118.0 lb

## 2021-12-25 DIAGNOSIS — R300 Dysuria: Secondary | ICD-10-CM | POA: Diagnosis not present

## 2021-12-25 DIAGNOSIS — N952 Postmenopausal atrophic vaginitis: Secondary | ICD-10-CM

## 2021-12-25 LAB — POCT URINALYSIS DIPSTICK
Bilirubin, UA: NEGATIVE
Blood, UA: NEGATIVE
Glucose, UA: NEGATIVE
Ketones, UA: NEGATIVE
Leukocytes, UA: NEGATIVE
Nitrite, UA: NEGATIVE
Protein, UA: NEGATIVE
Spec Grav, UA: <=1.005 — AB (ref 1.010–1.025)
Urobilinogen, UA: NEGATIVE E.U./dL — AB
pH, UA: 7 (ref 5.0–8.0)

## 2021-12-25 MED ORDER — ESTRADIOL 0.1 MG/GM VA CREA: TOPICAL_CREAM | VAGINAL | 12 refills | Status: AC

## 2021-12-25 NOTE — Progress Notes (Signed)
? ?Acute Office Visit ? ?Subjective:  ? ? Patient ID: Crystal Oneal, female    DOB: [birth year], 71 y.o.   MRN: [id number] ? ?Chief Complaint  ?Patient presents with  ? Urinary Tract Infection  ? ?HPI: ?Patient is in today for UTI symptoms. Mild burning and right flank pain and RLQ abdominal pain. Same, but not worsened. No constipation. NO diarrhea. No nausea or vomiting. Cloudy urine  ?Urinary Tract Infection  ?Associated symptoms include flank pain (Right side low back pain). Pertinent negatives include no hematuria, nausea or vomiting.  ? ?Past Medical History:  ?Diagnosis Date  ? Age-related osteoporosis without current pathological fracture   ? Arthritis   ? Cancer ([place]) 04/2020  ? 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 Cervical stenosis of spine   ? C11-12  ? Dysphagia   ? Esophageal dilation x2  ? Essential (primary) hypertension   ? GERD (gastroesophageal reflux disease)   ? Hypertension   ? Mixed hyperlipidemia   ? Paroxysmal SVT (supraventricular tachycardia) ([facility])   ? PONV (postoperative nausea and vomiting)   ? Spondylolisthesis of lumbar region   ? ? ?Past Surgical History:  ?Procedure Laterality Date  ? ABDOMINAL HYSTERECTOMY    ? ANTERIOR AND POSTERIOR REPAIR N/A 03/20/2016  ? Procedure: REPAIR CYSTOCELE AND RECTOCELE;  Surgeon: Bjorn Loser, MD;  Location: [facility];  Service: Urology;  Laterality: N/A;  ? BACK SURGERY  2019  ? has hardware  ? COLONOSCOPY    ? ESOPHAGEAL DILATION  2005  ? x 2  ? MOHS SURGERY  04/2020  ? 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 VAGINAL HYSTERECTOMY Bilateral 03/20/2016  ? Procedure: TOTAL VAGINAL HYSTERECTOMY WITH BILATERAL SALPINGO OOPHORECTOMY mccall coloplasty;  Surgeon: Servando Salina, MD;  Location: [facility];  Service: Gynecology;  Laterality: Bilateral;  ? VAGINAL PROLAPSE REPAIR N/A 03/20/2016  ? Procedure: REPAIR VAULT PROLAPSE AND GRAFT;  Surgeon: Bjorn Loser, MD;  Location: [facility];  Service: Urology;  Laterality: N/A;  ? ? ?Family History  ?Problem Relation  Age of Onset  ? Uterine cancer Mother   ? Breast cancer Mother   ? Lung disease Father   ? Breast cancer Sister   ? Multiple myeloma Brother   ? Kidney cancer Brother   ? ? ?Social History  ? ?Socioeconomic History  ? Marital status: Married  ?  Spouse name: Crystal Oneal  ? Number of children: 2  ? Years of education: Not on file  ? Highest education level: Not on file  ?Occupational History  ? Occupation: Retired  ?Tobacco Use  ? Smoking status: Never  ? Smokeless tobacco: Never  ?Vaping Use  ? Vaping Use: Never used  ?Substance and Sexual Activity  ? Alcohol use: Yes  ?  Alcohol/week: 1.0 standard drink  ?  Types: 1 Glasses of wine per week  ?  Comment: rarely  ? Drug use: Never  ? Sexual activity: Not on file  ?Other Topics Concern  ? Not on file  ?Social History Narrative  ? Not on file  ? ?Social Determinants of Health  ? ?Financial Resource Strain: Not on file  ?Food Insecurity: Not on file  ?Transportation Needs: Not on file  ?Physical Activity: Not on file  ?Stress: Not on file  ?Social Connections: Not on file  ?Intimate Partner Violence: Not on file  ? ? ?Outpatient Medications Prior to Visit  ?Medication Sig Dispense Refill  ? alendronate (FOSAMAX) 70 MG tablet TAKE ONE TABLET BY MOUTH EVERY FRIDAY. TAKE  WITH A FULL GLASS OF WATER ON AN EMPTY STOMACH. STAY UPRIGHT FOR 30 MIN AFTER TAKING. 12 tablet 2  ? aspirin EC 81 MG tablet Take 81 mg by mouth every evening.    ? atorvastatin (LIPITOR) 10 MG tablet Take 1 tablet (10 mg total) by mouth at bedtime. (Patient taking differently: Take 10 mg by mouth every other day.) 90 tablet 1  ? b complex vitamins tablet Take 1 tablet by mouth every evening.    ? calcium citrate-vitamin D (CITRACAL+D) 315-200 MG-UNIT tablet Take 2 tablets by mouth 2 (two) times daily.    ? cetirizine (ZYRTEC) 10 MG tablet Take 5-10 mg by mouth daily as needed for allergies (during spring months).    ? Cholecalciferol (VITAMIN D) 2000 units tablet Take 2,000 Units by mouth in the morning.    ?  docusate sodium (COLACE) 100 MG capsule Take 1 capsule (100 mg total) by mouth 2 (two) times daily. 60 capsule 0  ? famotidine (PEPCID) 40 MG tablet Take 1 tablet (40 mg total) by mouth daily. 90 tablet 3  ? fluticasone (FLONASE) 50 MCG/ACT nasal spray Place 1 spray into both nostrils daily as needed for allergies or rhinitis.    ? Ginger, Zingiber officinalis, (GINGER ROOT) 550 MG CAPS Take 1 capsule by mouth in the morning and at bedtime.    ? losartan (COZAAR) 50 MG tablet TAKE ONE (1) TABLET BY MOUTH EVERY DAY (Patient taking differently: Take 100 mg by mouth daily.) 90 tablet 0  ? melatonin 1 MG TABS tablet Take 1 mg by mouth at bedtime.    ? metoprolol succinate (TOPROL-XL) 25 MG 24 hr tablet Take 12.5 mg by mouth daily.    ? Olopatadine HCl (PATADAY OP) Place 1 drop into both eyes daily as needed (allergy).    ? Omega-3 Fatty Acids (FISH OIL PO) Take 2,400 mg by mouth in the morning.    ? TURMERIC CURCUMIN PO Take 1,000 mg by mouth in the morning.    ? vitamin B-12 (CYANOCOBALAMIN) 1000 MCG tablet Take 1,000 mcg by mouth in the morning.    ? vitamin C (ASCORBIC ACID) 250 MG tablet Take 250 mg by mouth 2 (two) times daily.    ? ?No facility-administered medications prior to visit.  ? ? ?Allergies  ?Allergen Reactions  ? Pneumovax 23 [Pneumococcal Vac Polyvalent] Palpitations  ?  tachycardia  ? Betadine [Povidone-Iodine] Other (See Comments)  ?  "Pt reports that she had angry red blisters all over her back after a procedure that scrubbed with betadine in 2019."  ? Lisinopril Cough  ? Sulfa Antibiotics Rash  ? ? ?Review of Systems  ?Constitutional:  Negative for appetite change, fatigue and fever.  ?HENT:  Negative for congestion, ear pain, sinus pressure and sore throat.   ?Respiratory:  Negative for cough, chest tightness, shortness of breath and wheezing.   ?Cardiovascular:  Negative for chest pain and palpitations.  ?Gastrointestinal:  Positive for abdominal pain (Right lower quadrant). Negative for  constipation, diarrhea, nausea and vomiting.  ?Genitourinary:  Positive for flank pain (Right side low back pain). Negative for dysuria, hematuria and vaginal discharge.  ?     Vaginal dryness.   ?Musculoskeletal:  Negative for arthralgias, back pain, joint swelling and myalgias.  ?Skin:  Negative for rash.  ?Neurological:  Negative for dizziness, weakness and headaches.  ?Psychiatric/Behavioral:  Negative for dysphoric mood. The patient is not nervous/anxious.   ? ?   ?Objective:  ?  ?Physical Exam ?Vitals reviewed.  ?  Constitutional:   ?   Appearance: Normal appearance. She is normal weight.  ?Cardiovascular:  ?   Rate and Rhythm: Normal rate and regular rhythm.  ?   Pulses: Normal pulses.  ?   Heart sounds: Normal heart sounds.  ?Pulmonary:  ?   Effort: Pulmonary effort is normal.  ?   Breath sounds: Normal breath sounds.  ?Abdominal:  ?   General: Abdomen is flat. Bowel sounds are normal.  ?   Palpations: Abdomen is soft.  ?Neurological:  ?   Mental Status: She is alert and oriented to person, place, and time.  ?Psychiatric:     ?   Mood and Affect: Mood normal.     ?   Behavior: Behavior normal.  ? ? ?BP 132/82 (BP Location: Left Arm, Patient Position: Sitting)   Pulse 71   Temp (!) 97.2 ?F (36.2 ?C) (Temporal)   Ht 5' 1"  (1.549 m)   Wt 118 lb (53.5 kg)   SpO2 99%   BMI 22.30 kg/m?  ?Wt Readings from Last 3 Encounters:  ?12/25/21 118 lb (53.5 kg)  ?11/06/21 118 lb (53.5 kg)  ?05/22/21 118 lb (53.5 kg)  ? ? ?Health Maintenance Due  ?Topic Date Due  ? Zoster Vaccines- Shingrix (1 of 2) Never done  ? Pneumonia Vaccine 47+ Years old (2 - PCV) 05/29/2014  ? COLONOSCOPY (Pts 45-53yr Insurance coverage will need to be confirmed)  11/16/2021  ? ? ?There are no preventive care reminders to display for this patient. ? ? ?Lab Results  ?Component Value Date  ? TSH 4.900 (H) 11/06/2021  ? ?Lab Results  ?Component Value Date  ? WBC 4.1 11/06/2021  ? HGB 14.1 11/06/2021  ? HCT 42.4 11/06/2021  ? MCV 87 11/06/2021  ? PLT  208 11/06/2021  ? ?Lab Results  ?Component Value Date  ? NA 141 11/06/2021  ? K 5.1 11/06/2021  ? CO2 27 11/06/2021  ? GLUCOSE 97 11/06/2021  ? BUN 12 11/06/2021  ? CREATININE 0.76 11/06/2021  ? BILITOT 0.5 11/07/18

## 2021-12-27 DIAGNOSIS — N952 Postmenopausal atrophic vaginitis: Secondary | ICD-10-CM | POA: Insufficient documentation

## 2021-12-27 DIAGNOSIS — R300 Dysuria: Secondary | ICD-10-CM | POA: Insufficient documentation

## 2021-12-27 NOTE — Assessment & Plan Note (Signed)
Recommend estrace vaginal cream as directed.  ?

## 2021-12-27 NOTE — Assessment & Plan Note (Signed)
UA normal.

## 2022-01-05 ENCOUNTER — Other Ambulatory Visit: Payer: Self-pay | Admitting: Family Medicine

## 2022-01-09 ENCOUNTER — Other Ambulatory Visit: Payer: Self-pay | Admitting: Family Medicine

## 2022-01-09 DIAGNOSIS — Z1231 Encounter for screening mammogram for malignant neoplasm of breast: Secondary | ICD-10-CM

## 2022-01-09 MED ORDER — METOPROLOL SUCCINATE ER 25 MG PO TB24: 12.5000 mg | ORAL_TABLET | Freq: Every day | ORAL | 0 refills | Status: DC

## 2022-01-17 DIAGNOSIS — Z800 Family history of malignant neoplasm of digestive organs: Secondary | ICD-10-CM | POA: Diagnosis not present

## 2022-01-17 DIAGNOSIS — Z1211 Encounter for screening for malignant neoplasm of colon: Secondary | ICD-10-CM | POA: Diagnosis not present

## 2022-01-17 LAB — HM COLONOSCOPY

## 2022-01-18 ENCOUNTER — Other Ambulatory Visit: Payer: Self-pay

## 2022-01-18 DIAGNOSIS — I10 Essential (primary) hypertension: Secondary | ICD-10-CM

## 2022-01-19 ENCOUNTER — Other Ambulatory Visit: Payer: Self-pay

## 2022-01-19 MED ORDER — FAMOTIDINE 40 MG PO TABS: 40.0000 mg | ORAL_TABLET | Freq: Every day | ORAL | 3 refills | Status: DC

## 2022-01-19 MED ORDER — LOSARTAN POTASSIUM 100 MG PO TABS: 100.0000 mg | ORAL_TABLET | Freq: Every day | ORAL | 1 refills | Status: DC

## 2022-01-22 ENCOUNTER — Other Ambulatory Visit: Payer: Self-pay

## 2022-01-22 DIAGNOSIS — I10 Essential (primary) hypertension: Secondary | ICD-10-CM

## 2022-01-22 MED ORDER — FAMOTIDINE 40 MG PO TABS: 40.0000 mg | ORAL_TABLET | Freq: Every day | ORAL | 1 refills | Status: DC

## 2022-01-22 MED ORDER — LOSARTAN POTASSIUM 100 MG PO TABS: 100.0000 mg | ORAL_TABLET | Freq: Every day | ORAL | 0 refills | Status: DC

## 2022-01-29 ENCOUNTER — Encounter: Payer: Self-pay | Admitting: Family Medicine

## 2022-02-07 ENCOUNTER — Ambulatory Visit
Admission: RE | Admit: 2022-02-07 | Discharge: 2022-02-07 | Disposition: A | Discharge status: Home / Self Care | Payer: PPO | Source: Ambulatory Visit | Attending: Family Medicine | Admitting: Family Medicine

## 2022-02-07 DIAGNOSIS — Z1231 Encounter for screening mammogram for malignant neoplasm of breast: Secondary | ICD-10-CM

## 2022-02-09 ENCOUNTER — Other Ambulatory Visit: Payer: Self-pay

## 2022-02-09 DIAGNOSIS — R928 Other abnormal and inconclusive findings on diagnostic imaging of breast: Secondary | ICD-10-CM

## 2022-02-12 ENCOUNTER — Other Ambulatory Visit: Payer: Self-pay | Admitting: Family Medicine

## 2022-02-12 DIAGNOSIS — R928 Other abnormal and inconclusive findings on diagnostic imaging of breast: Secondary | ICD-10-CM

## 2022-02-20 ENCOUNTER — Ambulatory Visit
Admission: RE | Admit: 2022-02-20 | Discharge: 2022-02-20 | Disposition: A | Discharge status: Home / Self Care | Payer: PPO | Source: Ambulatory Visit | Attending: Family Medicine | Admitting: Family Medicine

## 2022-02-20 ENCOUNTER — Other Ambulatory Visit: Payer: Self-pay | Admitting: Family Medicine

## 2022-02-20 DIAGNOSIS — R922 Inconclusive mammogram: Secondary | ICD-10-CM | POA: Diagnosis not present

## 2022-02-20 DIAGNOSIS — R928 Other abnormal and inconclusive findings on diagnostic imaging of breast: Secondary | ICD-10-CM

## 2022-02-20 DIAGNOSIS — N6320 Unspecified lump in the left breast, unspecified quadrant: Secondary | ICD-10-CM

## 2022-02-23 ENCOUNTER — Ambulatory Visit
Admission: RE | Admit: 2022-02-23 | Discharge: 2022-02-23 | Disposition: A | Discharge status: Home / Self Care | Payer: PPO | Source: Ambulatory Visit | Attending: Family Medicine | Admitting: Family Medicine

## 2022-02-23 DIAGNOSIS — N6320 Unspecified lump in the left breast, unspecified quadrant: Secondary | ICD-10-CM

## 2022-02-23 DIAGNOSIS — N6032 Fibrosclerosis of left breast: Secondary | ICD-10-CM | POA: Diagnosis not present

## 2022-02-23 DIAGNOSIS — N6323 Unspecified lump in the left breast, lower outer quadrant: Secondary | ICD-10-CM | POA: Diagnosis not present

## 2022-02-23 HISTORY — PX: BREAST BIOPSY: SHX20

## 2022-02-26 ENCOUNTER — Encounter: Payer: Self-pay | Admitting: Family Medicine

## 2022-03-27 DIAGNOSIS — Z981 Arthrodesis status: Secondary | ICD-10-CM | POA: Diagnosis not present

## 2022-03-27 DIAGNOSIS — M4316 Spondylolisthesis, lumbar region: Secondary | ICD-10-CM | POA: Diagnosis not present

## 2022-04-02 ENCOUNTER — Other Ambulatory Visit: Payer: Self-pay

## 2022-04-02 DIAGNOSIS — M810 Age-related osteoporosis without current pathological fracture: Secondary | ICD-10-CM

## 2022-04-02 MED ORDER — ALENDRONATE SODIUM 70 MG PO TABS: ORAL_TABLET | ORAL | 2 refills | Status: DC

## 2022-04-12 ENCOUNTER — Other Ambulatory Visit: Payer: Self-pay

## 2022-04-12 MED ORDER — METOPROLOL SUCCINATE ER 25 MG PO TB24: 12.5000 mg | ORAL_TABLET | Freq: Every day | ORAL | 0 refills | Status: DC

## 2022-04-23 IMAGING — CT CT HEART MORP W/ CTA COR W/ SCORE W/ CA W/CM &/OR W/O CM
4 of 7 series · 8 of 20 positions shown, 9 images · IV contrast (APPLIED)
Comparison: None.
COMPARISON: None.

Addendum:
EXAM:
OVER-READ INTERPRETATION  CT CHEST

The following report is an over-read performed by radiologist Dr.
Kun Mcgrath [[email or web address]] on 10/19/2020. This
over-read does not include interpretation of cardiac or coronary
anatomy or pathology. The coronary calcium score/coronary CTA
interpretation by the cardiologist is attached.
CLINICAL DATA: This is a 69 year old female with chest pain.
Cardiac/Coronary  CT
TECHNIQUE: The patient was scanned on a [employer] Force scanner.

[Series 6: best diast 71 % · axial · 0.35mm/px · z∈[-406,-365]mm · 2 of 311 slices shown]
[im 104/311  vessel]
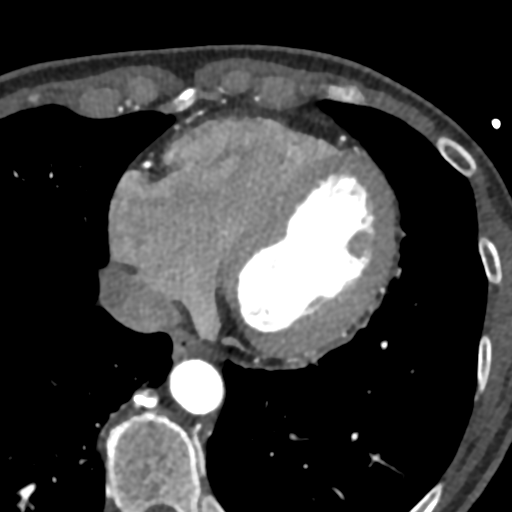
[im 207/311  vessel]
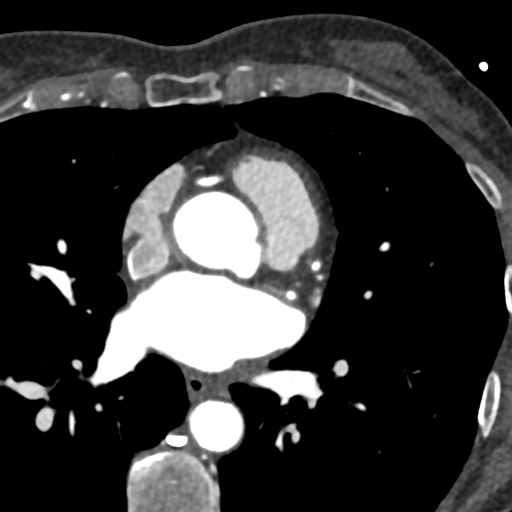

[Series 7: best syst · axial · 0.35mm/px · z∈[-406,-365]mm · 2 of 311 slices shown, 3 images]
[im 104/311  vessel]
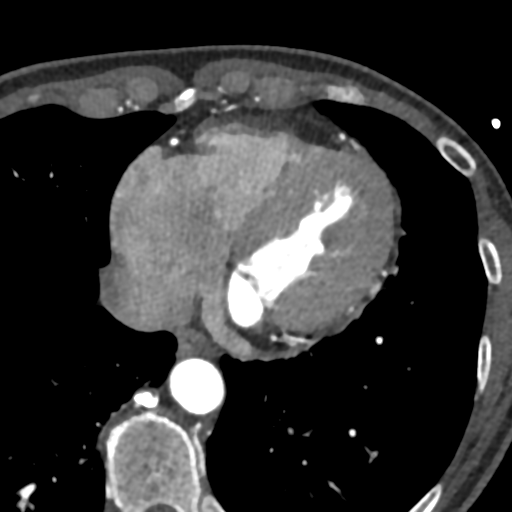
[im 104/311  lung]
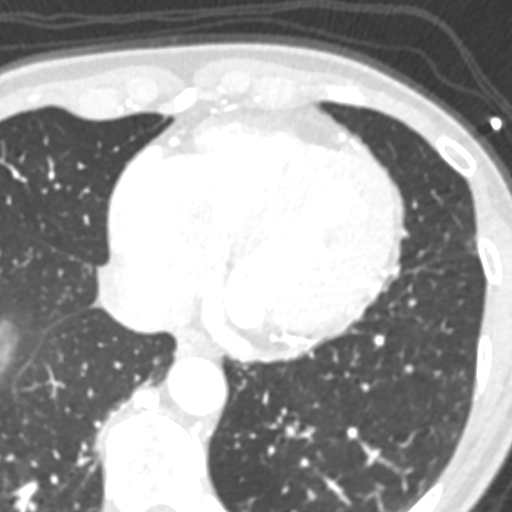
[im 207/311  vessel]
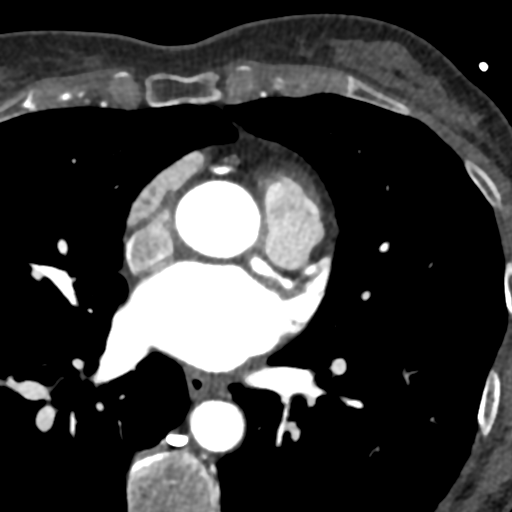

[Series 8: ts diast sharp 71 % · axial · 0.35mm/px · z∈[-406,-365]mm · 2 of 311 slices shown]
[im 104/311  lung]
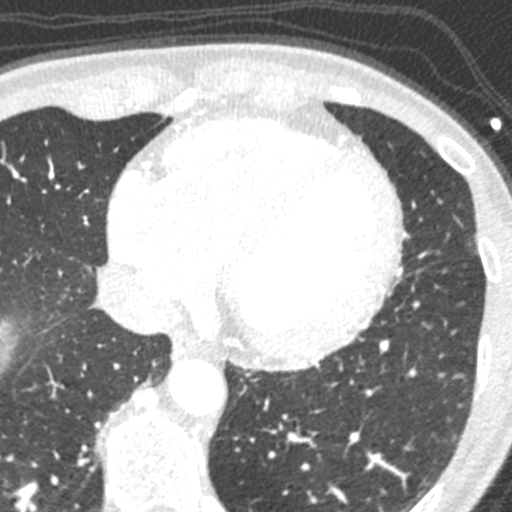
[im 207/311  lung]
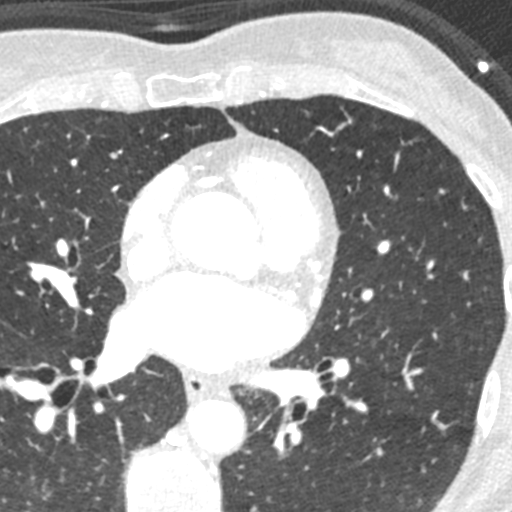

[Series 9: ts syst sharp · axial · 0.35mm/px · z∈[-406,-365]mm · 2 of 311 slices shown]
[im 104/311  lung]
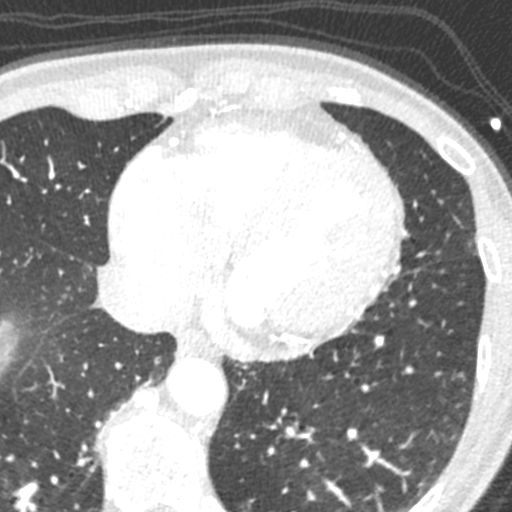
[im 207/311  lung]
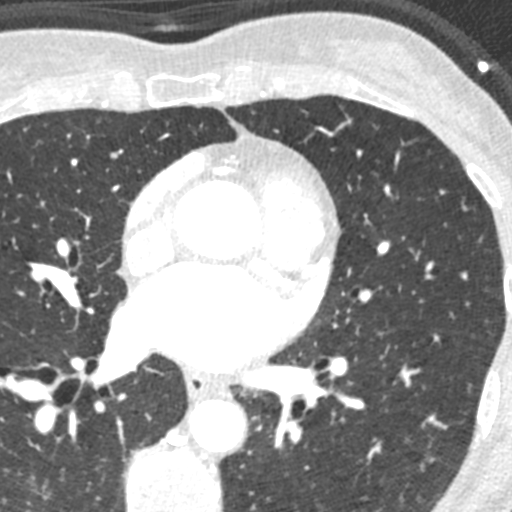

[8 of 20 positions shown; findings below may reference images not displayed]

FINDINGS: Atherosclerotic calcifications in the thoracic aorta. Within the
visualized portions of the thorax there are no suspicious appearing
pulmonary nodules or masses, there is no acute consolidative
airspace disease, no pleural effusions, no pneumothorax and no
lymphadenopathy. Visualized portions of the upper abdomen are
unremarkable. There are no aggressive appearing lytic or blastic
lesions noted in the visualized portions of the skeleton.
IMPRESSION: 1.  Aortic Atherosclerosis (V432N-9EU.U).
FINDINGS: A 120 kV prospective scan was triggered in the descending thoracic
aorta at 111 HU's. Axial non-contrast 3 mm slices were carried out
through the heart. The data set was analyzed on a dedicated work
station and scored using the Agatson method. Gantry rotation speed
was 250 msecs and collimation was .6 mm. No beta blockade and 0.8 mg
of sl NTG was given. The 3D data set was reconstructed in 5%
intervals of the 67-82 % of the R-R cycle. Diastolic phases were
analyzed on a dedicated work station using MPR, MIP and VRT modes.
The patient received 80 cc of contrast.

Aorta: Normal size.  No calcifications.  No dissection.

Aortic Valve:  Trileaflet.  No calcifications.

Coronary Arteries:  Normal coronary origin.  Right dominance.

RCA is a large dominant artery that gives rise to PDA and PLVB.
There is no plaque.

Left main is a large artery that gives rise to LAD and LCX arteries.

LAD is a large vessel that has no plaque.

LCX is a non-dominant artery that gives rise to one large OM1
branch. There is no plaque.

Other findings:

Normal pulmonary vein drainage into the left atrium.

Normal left atrial appendage without a thrombus.

Normal size of the pulmonary artery.
IMPRESSION: 1. Coronary calcium score of 0. This was 0 percentile for age and
sex matched control.

2. Normal coronary origin with right dominance.

3. No evidence of CAD.

Jamel Hatter, DO

*** End of Addendum ***
EXAM:
OVER-READ INTERPRETATION  CT CHEST

The following report is an over-read performed by radiologist Dr.
Kun Mcgrath [[email or web address]] on 10/19/2020. This
over-read does not include interpretation of cardiac or coronary
anatomy or pathology. The coronary calcium score/coronary CTA
interpretation by the cardiologist is attached.
FINDINGS: Atherosclerotic calcifications in the thoracic aorta. Within the
visualized portions of the thorax there are no suspicious appearing
pulmonary nodules or masses, there is no acute consolidative
airspace disease, no pleural effusions, no pneumothorax and no
lymphadenopathy. Visualized portions of the upper abdomen are
unremarkable. There are no aggressive appearing lytic or blastic
lesions noted in the visualized portions of the skeleton.
IMPRESSION: 1.  Aortic Atherosclerosis (V432N-[id number].U).

## 2022-05-01 ENCOUNTER — Ambulatory Visit
Admission: RE | Admit: 2022-05-01 | Discharge: 2022-05-01 | Disposition: A | Discharge status: Home / Self Care | Payer: PPO | Source: Ambulatory Visit | Attending: Family Medicine | Admitting: Family Medicine

## 2022-05-01 DIAGNOSIS — Z780 Asymptomatic menopausal state: Secondary | ICD-10-CM | POA: Diagnosis not present

## 2022-05-01 DIAGNOSIS — M810 Age-related osteoporosis without current pathological fracture: Secondary | ICD-10-CM

## 2022-05-01 DIAGNOSIS — M85832 Other specified disorders of bone density and structure, left forearm: Secondary | ICD-10-CM | POA: Diagnosis not present

## 2022-05-03 ENCOUNTER — Encounter (HOSPITAL_COMMUNITY): Payer: Self-pay | Admitting: Emergency Medicine

## 2022-05-03 ENCOUNTER — Other Ambulatory Visit: Payer: Self-pay

## 2022-05-03 ENCOUNTER — Emergency Department (HOSPITAL_COMMUNITY)
Admission: EM | Admit: 2022-05-03 | Discharge: 2022-05-04 | Disposition: A | Discharge status: Home / Self Care | Payer: PPO | Attending: Emergency Medicine | Admitting: Emergency Medicine

## 2022-05-03 ENCOUNTER — Emergency Department (HOSPITAL_COMMUNITY): Payer: PPO

## 2022-05-03 DIAGNOSIS — M7989 Other specified soft tissue disorders: Secondary | ICD-10-CM | POA: Diagnosis not present

## 2022-05-03 DIAGNOSIS — M25532 Pain in left wrist: Secondary | ICD-10-CM | POA: Diagnosis not present

## 2022-05-03 DIAGNOSIS — Z7982 Long term (current) use of aspirin: Secondary | ICD-10-CM | POA: Diagnosis not present

## 2022-05-03 DIAGNOSIS — W010XXA Fall on same level from slipping, tripping and stumbling without subsequent striking against object, initial encounter: Secondary | ICD-10-CM | POA: Insufficient documentation

## 2022-05-03 DIAGNOSIS — S52501A Unspecified fracture of the lower end of right radius, initial encounter for closed fracture: Secondary | ICD-10-CM | POA: Diagnosis not present

## 2022-05-03 DIAGNOSIS — S6992XA Unspecified injury of left wrist, hand and finger(s), initial encounter: Secondary | ICD-10-CM | POA: Diagnosis present

## 2022-05-03 DIAGNOSIS — S52572A Other intraarticular fracture of lower end of left radius, initial encounter for closed fracture: Secondary | ICD-10-CM | POA: Diagnosis not present

## 2022-05-03 MED ORDER — OXYCODONE-ACETAMINOPHEN 5-325 MG PO TABS
1.0000 | ORAL_TABLET | ORAL | Status: AC | PRN
  Administered 2022-05-04 (×2): 1 via ORAL
  Filled 2022-05-03 (×2): qty 1

## 2022-05-03 NOTE — ED Triage Notes (Signed)
Pt reported to ED with c/o pain and swelling to left wrist after mechanical trip and fall over dog.

## 2022-05-03 NOTE — ED Provider Triage Note (Signed)
Emergency Medicine Provider Triage Evaluation Note  JOZY MCPHEARSON , a 71 y.o. female  was evaluated in triage.  Pt complains of left wrist pain/swelling onset PTA. Pt notes that she tripped over her dog and landed with a [place] injury. Denies hitting her head or LOC.  Denies anticoagulant use.  Review of Systems  Positive:  Negative:   Physical Exam  BP (!) 178/91 (BP Location: Right Arm)   Pulse 80   Temp 98.4 F (36.9 C) (Oral)   Resp 16   SpO2 100%  Gen:   Awake, no distress   Resp:  Normal effort  MSK:   Moves extremities without difficulty  Other:  Decreased range of motion of left wrist secondary to pain.  Obvious deformity noted to left wrist and left distal forearm.  Tenderness to palpation noted to the region.  Capillary refill less than 2 seconds.  Radial pulse intact.  Medical Decision Making  Medically screening exam initiated at 8:04 PM.  Appropriate orders placed.  BONI MACLELLAN was informed that the remainder of the evaluation will be completed by another provider, this initial triage assessment does not replace that evaluation, and the importance of remaining in the ED until their evaluation is complete.  Work-up initiated.    Kerriann Kamphuis A, PA-C 05/03/22 2008

## 2022-05-04 DIAGNOSIS — S52501A Unspecified fracture of the lower end of right radius, initial encounter for closed fracture: Secondary | ICD-10-CM | POA: Diagnosis not present

## 2022-05-04 MED ORDER — LIDOCAINE HCL 2 % IJ SOLN
10.0000 mL | Freq: Once | INTRAMUSCULAR | Status: AC
  Administered 2022-05-04: 200 mg
  Filled 2022-05-04: qty 20

## 2022-05-04 MED ORDER — OXYCODONE-ACETAMINOPHEN 5-325 MG PO TABS: 1.0000 | ORAL_TABLET | Freq: Three times a day (TID) | ORAL | 0 refills | Status: DC | PRN

## 2022-05-04 NOTE — Progress Notes (Signed)
Orthopedic Tech Progress Note Patient Details:  Crystal Oneal [birth year] [id number]  Ortho Devices Type of Ortho Device: Finger trap, Sugartong splint Finger Trap Weight: 5 Ortho Device/Splint Location: lue Ortho Device/Splint Interventions: Ordered, Application, Adjustment  I applied finger traps at drs request. Post Interventions Patient Tolerated: Well Instructions Provided: Care of device, Adjustment of device  Karolee Stamps 05/04/2022, 2:56 AM

## 2022-05-04 NOTE — ED Notes (Signed)
Ice pack applied to L wrist

## 2022-05-04 NOTE — ED Provider Notes (Signed)
[facility] EMERGENCY DEPARTMENT Provider Note   CSN: [id number] Arrival date & time: 05/03/22  6283     History  Chief Complaint  Patient presents with   Arm Injury    Crystal Oneal is a 71 y.o. female.  71 year old female who presents the ER today with a mechanical fall.  Patient states that her dog knocked her to the ground she fell down on her left hand causing pain to the left wrist.  No injuries elsewhere.  No syncope.  No other associated symptoms.  Had a Percocet before coming back which seem to help with the pain a little bit.   Arm Injury      Home Medications Prior to Admission medications   Medication Sig Start Date End Date Taking? Authorizing Provider  oxyCODONE-acetaminophen (PERCOCET) 5-325 MG tablet Take 1-2 tablets by mouth every 8 (eight) hours as needed for severe pain. 05/04/22  Yes Livana Yerian, Corene Cornea, MD  alendronate (FOSAMAX) 70 MG tablet TAKE ONE TABLET BY MOUTH EVERY FRIDAY. TAKE WITH A FULL GLASS OF WATER ON AN EMPTY STOMACH. STAY UPRIGHT FOR 30 MIN AFTER TAKING. 04/02/22   CoxElnita Maxwell, MD  aspirin EC 81 MG tablet Take 81 mg by mouth every evening.    [provider]  atorvastatin (LIPITOR) 10 MG tablet Take 1 tablet (10 mg total) by mouth at bedtime. Patient taking differently: Take 10 mg by mouth every other day. 11/09/21   CoxElnita Maxwell, MD  b complex vitamins tablet Take 1 tablet by mouth every evening.    [provider]  calcium citrate-vitamin D (CITRACAL+D) 315-200 MG-UNIT tablet Take 2 tablets by mouth 2 (two) times daily.    [provider]  cetirizine (ZYRTEC) 10 MG tablet Take 5-10 mg by mouth daily as needed for allergies (during spring months).    [provider]  Cholecalciferol (VITAMIN D) 2000 units tablet Take 2,000 Units by mouth in the morning.    [provider]  docusate sodium (COLACE) 100 MG capsule Take 1 capsule (100 mg total) by mouth 2 (two) times daily. 05/23/21    Newman Pies, MD  famotidine (PEPCID) 40 MG tablet Take 1 tablet (40 mg total) by mouth daily. 01/22/22   Cox, Elnita Maxwell, MD  fluticasone (FLONASE) 50 MCG/ACT nasal spray Place 1 spray into both nostrils daily as needed for allergies or rhinitis.    [provider]  Ginger, Zingiber officinalis, (GINGER ROOT) 550 MG CAPS Take 1 capsule by mouth in the morning and at bedtime.    [provider]  losartan (COZAAR) 100 MG tablet Take 1 tablet (100 mg total) by mouth daily. 01/22/22   Cox, Elnita Maxwell, MD  melatonin 1 MG TABS tablet Take 1 mg by mouth at bedtime.    [provider]  metoprolol succinate (TOPROL-XL) 25 MG 24 hr tablet Take 0.5 tablets (12.5 mg total) by mouth daily. 04/12/22   Cox, Elnita Maxwell, MD  Olopatadine HCl (PATADAY OP) Place 1 drop into both eyes daily as needed (allergy).    [provider]  Omega-3 Fatty Acids (FISH OIL PO) Take 2,400 mg by mouth in the morning.    [provider]  TURMERIC CURCUMIN PO Take 1,000 mg by mouth in the morning.    [provider]  vitamin B-12 (CYANOCOBALAMIN) 1000 MCG tablet Take 1,000 mcg by mouth in the morning.    [provider]  vitamin C (ASCORBIC ACID) 250 MG tablet Take 250 mg by mouth 2 (two) times daily.  [provider]      Allergies    Pneumovax 23 [pneumococcal vac polyvalent], Betadine [povidone-iodine], Lisinopril, and Sulfa antibiotics    Review of Systems   Review of Systems  Physical Exam Updated Vital Signs BP (!) 140/77   Pulse 72   Temp 98.7 F (37.1 C)   Resp 18   SpO2 96%  Physical Exam Vitals and nursing note reviewed.  Constitutional:      Appearance: She is well-developed.  HENT:     Head: Normocephalic and atraumatic.  Eyes:     Pupils: Pupils are equal, round, and reactive to light.  Cardiovascular:     Rate and Rhythm: Normal rate and regular rhythm.  Pulmonary:     Effort: No respiratory distress.     Breath sounds: No stridor.   Abdominal:     General: There is no distension.  Musculoskeletal:        General: Swelling (Left wrist) and tenderness present.     Cervical back: Normal range of motion.  Skin:    General: Skin is warm and dry.  Neurological:     General: No focal deficit present.     Mental Status: She is alert.     ED Results / Procedures / Treatments   Labs (all labs ordered are listed, but only abnormal results are displayed) Labs Reviewed - No data to display  EKG None  Radiology DG Wrist Complete Left  Result Date: 05/03/2022 CLINICAL DATA:  Tripped and fell on outstretched hand EXAM: LEFT WRIST - COMPLETE 3+ VIEW COMPARISON:  None Available. FINDINGS: Frontal, oblique, lateral, and ulnar deviated views of the left wrist are obtained. There is a comminuted intra-articular distal left radial fracture with impaction and mild dorsal angulation. The radiocarpal joint remains intact. No other acute bony abnormalities. 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 Diffuse soft tissue swelling. IMPRESSION: 1. Comminuted intra-articular distal left radial fracture, with mild impaction and dorsal angulation as above. 2. 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 3. Soft tissue swelling. Electronically Signed   By: Randa Ngo M.D.   On: 05/03/2022 20:35    Procedures Procedures    Medications Ordered in ED Medications  oxyCODONE-acetaminophen (PERCOCET/ROXICET) 5-325 MG per tablet 1 tablet (1 tablet Oral Given 05/04/22 0034)  lidocaine (XYLOCAINE) 2 % (with pres) injection 200 mg (200 mg Other Given 05/04/22 0033)    ED Course/ Medical Decision Making/ A&P                           Medical Decision Making Risk Prescription drug management.  Patient with a distal radius fracture.  Hematoma block performed and then put in finger traps for about 10 minutes and splinted with a sugar-tong splint.  Pain controlled.  Neurovascular intact before and after splint  application will need to follow-up with hand surgery.   Final Clinical Impression(s) / ED Diagnoses Final diagnoses:  Closed fracture of distal end of right radius, unspecified fracture morphology, initial encounter    Rx / DC Orders ED Discharge Orders          Ordered    oxyCODONE-acetaminophen (PERCOCET) 5-325 MG tablet  Every 8 hours PRN        05/04/22 0228    oxyCODONE-acetaminophen (PERCOCET) 5-325 MG tablet  Every 4 hours PRN        Pending              Thia Olesen,  Corene Cornea, MD 05/04/22 [phone]

## 2022-05-07 ENCOUNTER — Telehealth: Payer: Self-pay | Admitting: Cardiology

## 2022-05-07 DIAGNOSIS — S52572A Other intraarticular fracture of lower end of left radius, initial encounter for closed fracture: Secondary | ICD-10-CM | POA: Diagnosis not present

## 2022-05-07 DIAGNOSIS — K219 Gastro-esophageal reflux disease without esophagitis: Secondary | ICD-10-CM | POA: Insufficient documentation

## 2022-05-07 DIAGNOSIS — M810 Age-related osteoporosis without current pathological fracture: Secondary | ICD-10-CM | POA: Insufficient documentation

## 2022-05-07 DIAGNOSIS — E782 Mixed hyperlipidemia: Secondary | ICD-10-CM | POA: Insufficient documentation

## 2022-05-07 NOTE — Telephone Encounter (Signed)
   Name: Crystal Oneal  DOB: [birth year]  MRN: [id number]   Primary Cardiologist: Berniece Salines, DO  Chart reviewed as part of pre-operative protocol coverage. Patient was contacted 05/07/2022 in reference to pre-operative risk assessment for pending surgery as outlined below.  Crystal Oneal was last seen on 02/14/2021 by Dr.Tobb.  Since that day, Crystal Oneal has done well from a cardiac standpoint. She has had some intermittently elevated BP, otherwise she has no concerns. She has a OV scheduled with Dr. Harriet Masson on 06/01/2022. She is able to complete > 4 METS without difficulty.   Therefore, based on ACC/AHA guidelines, the patient would be at acceptable risk for the planned procedure without further cardiovascular testing.   The patient was advised that if she develops new symptoms prior to surgery to contact our office to arrange for a follow-up visit, and she verbalized understanding.  I will route this recommendation to the requesting party via Epic fax function and remove from pre-op pool. Please call with questions.  Lenna Sciara, NP 05/07/2022, 12:30 PM

## 2022-05-07 NOTE — Telephone Encounter (Signed)
   Pre-operative Risk Assessment    Patient Name: Crystal Oneal  DOB: [birth year] MRN: [id number]      Request for Surgical Clearance    Procedure:   Open Reduction Internal Fixation Left Distal Radius Fracture  Date of Surgery:  Clearance 05/09/22                                 Surgeon:  Dr. Lennette Bihari Kuzma/ Dr Charlotte Crumb Surgeon's Group or Practice Name:  [facility] Phone number:  [phone] Fax number:  [phone]   Type of Clearance Requested:   - Medical    Type of Anesthesia:   Axillary Block with MAC   Additional requests/questions:  Please advise surgeon/provider what medications should be held.  Signed, Belisicia T Harris   05/07/2022, 11:55 AM

## 2022-05-08 ENCOUNTER — Other Ambulatory Visit: Payer: Self-pay

## 2022-05-08 ENCOUNTER — Encounter (HOSPITAL_BASED_OUTPATIENT_CLINIC_OR_DEPARTMENT_OTHER): Payer: Self-pay | Admitting: Orthopedic Surgery

## 2022-05-08 ENCOUNTER — Other Ambulatory Visit: Payer: Self-pay | Admitting: Orthopedic Surgery

## 2022-05-08 NOTE — Progress Notes (Signed)
   05/08/22 1040  PAT Phone Screen  Do You Have Diabetes? No  Do You Have Hypertension? Yes  Have You Ever Been to the ER for Asthma? No  Have You Taken Oral Steroids in the Past 3 Months? No  Do you Take Phenteramine or any Other Diet Drugs? No  Recent  Lab Work, EKG, CXR? No  Do you have a history of heart problems? (S)  Yes;Cardiologist (Dr Harriet Masson for HTN, PSVT)  Have You Ever Had Tests on Your Heart? Yes  Where? 10-31-20 ECHO EF 60-65%  Any Recent Hospitalizations? No  Height '5\' 1"'$  (1.549 m)  Weight 53.5 kg  Pat Appointment Scheduled No  Reason for No Appointment (S)  Pt. Lives Out of [place] (will need EKG DOS)

## 2022-05-11 ENCOUNTER — Ambulatory Visit (HOSPITAL_BASED_OUTPATIENT_CLINIC_OR_DEPARTMENT_OTHER): Payer: PPO | Admitting: Anesthesiology

## 2022-05-11 ENCOUNTER — Encounter (HOSPITAL_BASED_OUTPATIENT_CLINIC_OR_DEPARTMENT_OTHER): Payer: Self-pay | Admitting: Orthopedic Surgery

## 2022-05-11 ENCOUNTER — Ambulatory Visit (HOSPITAL_BASED_OUTPATIENT_CLINIC_OR_DEPARTMENT_OTHER): Payer: PPO

## 2022-05-11 ENCOUNTER — Ambulatory Visit (HOSPITAL_BASED_OUTPATIENT_CLINIC_OR_DEPARTMENT_OTHER)
Admission: RE | Admit: 2022-05-11 | Discharge: 2022-05-11 | Disposition: A | Discharge status: Home / Self Care | Payer: PPO | Attending: Orthopedic Surgery | Admitting: Orthopedic Surgery

## 2022-05-11 ENCOUNTER — Encounter (HOSPITAL_BASED_OUTPATIENT_CLINIC_OR_DEPARTMENT_OTHER)
Admission: RE | Disposition: A | Discharge status: Home / Self Care | Payer: Self-pay | Source: Home / Self Care | Attending: Orthopedic Surgery

## 2022-05-11 ENCOUNTER — Ambulatory Visit (INDEPENDENT_AMBULATORY_CARE_PROVIDER_SITE_OTHER): Payer: PPO | Admitting: Family Medicine

## 2022-05-11 VITALS — BP 124/72 | HR 72 | Temp 95.7°F | Resp 16 | Ht 60.5 in | Wt 117.0 lb

## 2022-05-11 DIAGNOSIS — K219 Gastro-esophageal reflux disease without esophagitis: Secondary | ICD-10-CM | POA: Diagnosis not present

## 2022-05-11 DIAGNOSIS — E782 Mixed hyperlipidemia: Secondary | ICD-10-CM

## 2022-05-11 DIAGNOSIS — Z01810 Encounter for preprocedural cardiovascular examination: Secondary | ICD-10-CM | POA: Diagnosis not present

## 2022-05-11 DIAGNOSIS — T148XXA Other injury of unspecified body region, initial encounter: Secondary | ICD-10-CM | POA: Diagnosis not present

## 2022-05-11 DIAGNOSIS — G709 Myoneural disorder, unspecified: Secondary | ICD-10-CM | POA: Insufficient documentation

## 2022-05-11 DIAGNOSIS — Z79899 Other long term (current) drug therapy: Secondary | ICD-10-CM | POA: Diagnosis not present

## 2022-05-11 DIAGNOSIS — S52572A Other intraarticular fracture of lower end of left radius, initial encounter for closed fracture: Secondary | ICD-10-CM | POA: Diagnosis not present

## 2022-05-11 DIAGNOSIS — I10 Essential (primary) hypertension: Secondary | ICD-10-CM | POA: Insufficient documentation

## 2022-05-11 DIAGNOSIS — I158 Other secondary hypertension: Secondary | ICD-10-CM

## 2022-05-11 DIAGNOSIS — W19XXXA Unspecified fall, initial encounter: Secondary | ICD-10-CM | POA: Diagnosis not present

## 2022-05-11 DIAGNOSIS — I471 Supraventricular tachycardia: Secondary | ICD-10-CM | POA: Diagnosis not present

## 2022-05-11 DIAGNOSIS — S5292XA Unspecified fracture of left forearm, initial encounter for closed fracture: Secondary | ICD-10-CM

## 2022-05-11 DIAGNOSIS — M1990 Unspecified osteoarthritis, unspecified site: Secondary | ICD-10-CM

## 2022-05-11 DIAGNOSIS — S52571A Other intraarticular fracture of lower end of right radius, initial encounter for closed fracture: Secondary | ICD-10-CM | POA: Diagnosis not present

## 2022-05-11 HISTORY — PX: OPEN REDUCTION INTERNAL FIXATION (ORIF) DISTAL RADIAL FRACTURE: SHX5989

## 2022-05-11 LAB — CBC WITH DIFFERENTIAL/PLATELET
Basophils Absolute: 0 10*3/uL (ref 0.0–0.2)
Basos: 0 %
EOS (ABSOLUTE): 0.2 10*3/uL (ref 0.0–0.4)
Eos: 3 %
Hematocrit: 38 % (ref 34.0–46.6)
Hemoglobin: 12.6 g/dL (ref 11.1–15.9)
Lymphocytes Absolute: 1.2 10*3/uL (ref 0.7–3.1)
Lymphs: 22 %
MCH: 29.3 pg (ref 26.6–33.0)
MCHC: 33.2 g/dL (ref 31.5–35.7)
MCV: 88 fL (ref 79–97)
Monocytes Absolute: 0.6 10*3/uL (ref 0.1–0.9)
Monocytes: 11 %
Neutrophils Absolute: 3.6 10*3/uL (ref 1.4–7.0)
Neutrophils: 64 %
Platelets: 232 10*3/uL (ref 150–450)
RBC: 4.3 x10E6/uL (ref 3.77–5.28)
RDW: 13.5 % (ref 11.7–15.4)
WBC: 5.6 10*3/uL (ref 3.4–10.8)

## 2022-05-11 LAB — COMPREHENSIVE METABOLIC PANEL
ALT: 26 IU/L (ref 0–32)
AST: 33 IU/L (ref 0–40)
Albumin/Globulin Ratio: 1.6 (ref 1.2–2.2)
Albumin: 4.3 g/dL (ref 3.9–4.9)
Alkaline Phosphatase: 35 IU/L — ABNORMAL LOW (ref 44–121)
BUN/Creatinine Ratio: 17 (ref 12–28)
BUN: 12 mg/dL (ref 8–27)
Bilirubin Total: 0.6 mg/dL (ref 0.0–1.2)
CO2: 27 mmol/L (ref 20–29)
Calcium: 9.6 mg/dL (ref 8.7–10.3)
Chloride: 104 mmol/L (ref 96–106)
Creatinine, Ser: 0.7 mg/dL (ref 0.57–1.00)
Globulin, Total: 2.7 g/dL (ref 1.5–4.5)
Glucose: 91 mg/dL (ref 70–99)
Potassium: 4 mmol/L (ref 3.5–5.2)
Sodium: 138 mmol/L (ref 134–144)
Total Protein: 7 g/dL (ref 6.0–8.5)
eGFR: 93 mL/min/{1.73_m2} (ref >59)

## 2022-05-11 LAB — PROTIME-INR
INR: 1 (ref 0.9–1.2)
Prothrombin Time: 10.1 s (ref 9.1–12.0)

## 2022-05-11 SURGERY — OPEN REDUCTION INTERNAL FIXATION (ORIF) DISTAL RADIUS FRACTURE
Anesthesia: Monitor Anesthesia Care | Site: Wrist | Laterality: Left

## 2022-05-11 MED ORDER — PROPOFOL 500 MG/50ML IV EMUL
INTRAVENOUS | Status: AC
  Filled 2022-05-11: qty 50

## 2022-05-11 MED ORDER — PROPOFOL 10 MG/ML IV BOLUS
INTRAVENOUS | Status: DC | PRN
  Administered 2022-05-11: 30 mg via INTRAVENOUS
  Administered 2022-05-11: 20 mg via INTRAVENOUS

## 2022-05-11 MED ORDER — BUPIVACAINE-EPINEPHRINE (PF) 0.5% -1:200000 IJ SOLN
INTRAMUSCULAR | Status: DC | PRN
  Administered 2022-05-11: 30 mL via PERINEURAL

## 2022-05-11 MED ORDER — LACTATED RINGERS IV SOLN: INTRAVENOUS | Status: DC

## 2022-05-11 MED ORDER — FENTANYL CITRATE (PF) 100 MCG/2ML IJ SOLN: 25.0000 ug | INTRAMUSCULAR | Status: DC | PRN

## 2022-05-11 MED ORDER — CEFAZOLIN SODIUM-DEXTROSE 2-4 GM/100ML-% IV SOLN
INTRAVENOUS | Status: AC
  Filled 2022-05-11: qty 100

## 2022-05-11 MED ORDER — FENTANYL CITRATE (PF) 100 MCG/2ML IJ SOLN
INTRAMUSCULAR | Status: AC
  Filled 2022-05-11: qty 2

## 2022-05-11 MED ORDER — OXYCODONE-ACETAMINOPHEN 5-325 MG PO TABS: ORAL_TABLET | ORAL | 0 refills | Status: DC

## 2022-05-11 MED ORDER — PROPOFOL 10 MG/ML IV BOLUS
INTRAVENOUS | Status: AC
  Filled 2022-05-11: qty 20

## 2022-05-11 MED ORDER — ONDANSETRON HCL 4 MG/2ML IJ SOLN: 4.0000 mg | Freq: Four times a day (QID) | INTRAMUSCULAR | Status: DC | PRN

## 2022-05-11 MED ORDER — CEFAZOLIN SODIUM-DEXTROSE 2-4 GM/100ML-% IV SOLN
2.0000 g | INTRAVENOUS | Status: AC
  Administered 2022-05-11: 2 g via INTRAVENOUS

## 2022-05-11 MED ORDER — ONDANSETRON HCL 4 MG/2ML IJ SOLN
INTRAMUSCULAR | Status: DC | PRN
  Administered 2022-05-11: 4 mg via INTRAVENOUS

## 2022-05-11 MED ORDER — FENTANYL CITRATE (PF) 100 MCG/2ML IJ SOLN
50.0000 ug | Freq: Once | INTRAMUSCULAR | Status: AC
  Administered 2022-05-11: 50 ug via INTRAVENOUS

## 2022-05-11 MED ORDER — ONDANSETRON HCL 4 MG/2ML IJ SOLN
INTRAMUSCULAR | Status: AC
  Filled 2022-05-11: qty 2

## 2022-05-11 MED ORDER — 0.9 % SODIUM CHLORIDE (POUR BTL) OPTIME
TOPICAL | Status: DC | PRN
  Administered 2022-05-11: 300 mL

## 2022-05-11 MED ORDER — MIDAZOLAM HCL 2 MG/2ML IJ SOLN
INTRAMUSCULAR | Status: AC
  Filled 2022-05-11: qty 2

## 2022-05-11 MED ORDER — PROPOFOL 500 MG/50ML IV EMUL
INTRAVENOUS | Status: DC | PRN
  Administered 2022-05-11: 75 ug/kg/min via INTRAVENOUS

## 2022-05-11 SURGICAL SUPPLY — 60 items
APL PRP STRL LF DISP 70% ISPRP (MISCELLANEOUS) ×1
BIT DRILL 2.0 LNG QUCK RELEASE (BIT) IMPLANT
BIT DRILL QC 2.8X5 (BIT) IMPLANT
BLADE SURG 15 STRL LF DISP TIS (BLADE) ×2 IMPLANT
BLADE SURG 15 STRL SS (BLADE) ×2
BNDG CMPR 9X4 STRL LF SNTH (GAUZE/BANDAGES/DRESSINGS) ×1
BNDG ELASTIC 3X5.8 VLCR STR LF (GAUZE/BANDAGES/DRESSINGS) ×1 IMPLANT
BNDG ESMARK 4X9 LF (GAUZE/BANDAGES/DRESSINGS) ×1 IMPLANT
BNDG GAUZE DERMACEA FLUFF 4 (GAUZE/BANDAGES/DRESSINGS) ×1 IMPLANT
BNDG GZE DERMACEA 4 6PLY (GAUZE/BANDAGES/DRESSINGS) ×1
BNDG PLASTER X FAST 3X3 WHT LF (CAST SUPPLIES) ×10 IMPLANT
BNDG PLSTR 9X3 FST ST WHT (CAST SUPPLIES) ×10
CHLORAPREP W/TINT 26 (MISCELLANEOUS) ×1 IMPLANT
CORD BIPOLAR FORCEPS 12FT (ELECTRODE) ×1 IMPLANT
COVER BACK TABLE 60X90IN (DRAPES) ×1 IMPLANT
COVER MAYO STAND STRL (DRAPES) ×1 IMPLANT
CUFF TOURN SGL QUICK 18X4 (TOURNIQUET CUFF) IMPLANT
CUFF TOURN SGL QUICK 24 (TOURNIQUET CUFF)
CUFF TRNQT CYL 24X4X16.5-23 (TOURNIQUET CUFF) IMPLANT
DRAPE EXTREMITY T 121X128X90 (DISPOSABLE) ×1 IMPLANT
DRAPE OEC MINIVIEW 54X84 (DRAPES) ×1 IMPLANT
DRAPE SURG 17X23 STRL (DRAPES) ×1 IMPLANT
DRILL 2.0 LNG QUICK RELEASE (BIT) ×1
GAUZE SPONGE 4X4 12PLY STRL (GAUZE/BANDAGES/DRESSINGS) ×1 IMPLANT
GAUZE XEROFORM 1X8 LF (GAUZE/BANDAGES/DRESSINGS) ×1 IMPLANT
GLOVE BIO SURGEON STRL SZ7.5 (GLOVE) ×1 IMPLANT
GLOVE BIOGEL PI IND STRL 8 (GLOVE) ×1 IMPLANT
GLOVE BIOGEL PI IND STRL 8.5 (GLOVE) IMPLANT
GLOVE SURG ORTHO 8.0 STRL STRW (GLOVE) IMPLANT
GOWN STRL REUS W/ TWL LRG LVL3 (GOWN DISPOSABLE) ×1 IMPLANT
GOWN STRL REUS W/TWL LRG LVL3 (GOWN DISPOSABLE) ×1
GOWN STRL REUS W/TWL XL LVL3 (GOWN DISPOSABLE) ×1 IMPLANT
GUIDEWIRE ORTHO 0.054X6 (WIRE) IMPLANT
NDL HYPO 25X1 1.5 SAFETY (NEEDLE) IMPLANT
NEEDLE HYPO 25X1 1.5 SAFETY (NEEDLE) IMPLANT
NS IRRIG 1000ML POUR BTL (IV SOLUTION) ×1 IMPLANT
PACK BASIN DAY SURGERY FS (CUSTOM PROCEDURE TRAY) ×1 IMPLANT
PAD CAST 3X4 CTTN HI CHSV (CAST SUPPLIES) ×1 IMPLANT
PADDING CAST COTTON 3X4 STRL (CAST SUPPLIES) ×1
PLATE PROX NARROW LEFT (Plate) IMPLANT
SCREW CORT FT 20X2.3XLCK HEX (Screw) IMPLANT
SCREW CORTICAL LOCKING 2.3X16M (Screw) ×2 IMPLANT
SCREW CORTICAL LOCKING 2.3X18M (Screw) ×2 IMPLANT
SCREW CORTICAL LOCKING 2.3X20M (Screw) ×2 IMPLANT
SCREW FX16X2.3XLCK SMTH NS CRT (Screw) IMPLANT
SCREW FX18X2.3XSMTH LCK NS CRT (Screw) IMPLANT
SCREW HEX 3.5X15 NLCKG STRL (Screw) IMPLANT
SCREW HEX 3.5X15MM (Screw) ×1 IMPLANT
SCREW HEXALOBE NON-LOCK 3.5X14 (Screw) IMPLANT
SCREW NLCKG 13 3.5X13 HEXA (Screw) IMPLANT
SCREW NON-LOCK 3.5X13 (Screw) ×1 IMPLANT
SCREW NONLOCK HEX 3.5X12 (Screw) IMPLANT
SLEEVE SCD COMPRESS KNEE MED (STOCKING) IMPLANT
STOCKINETTE 4X48 STRL (DRAPES) ×1 IMPLANT
SUT ETHILON 4 0 PS 2 18 (SUTURE) ×1 IMPLANT
SUT VICRYL 4-0 PS2 18IN ABS (SUTURE) ×1 IMPLANT
SYR BULB EAR ULCER 3OZ GRN STR (SYRINGE) ×1 IMPLANT
SYR CONTROL 10ML LL (SYRINGE) IMPLANT
TOWEL GREEN STERILE FF (TOWEL DISPOSABLE) ×2 IMPLANT
UNDERPAD 30X36 HEAVY ABSORB (UNDERPADS AND DIAPERS) ×1 IMPLANT

## 2022-05-11 NOTE — Progress Notes (Unsigned)
Subjective:  Patient ID: Crystal Oneal, female    DOB: Jan 12, 1951  Age: 71 y.o. MRN: [id number]  Chief Complaint  Patient presents with   Hyperlipidemia   Hypertension    HPI Hypertension: on losartan 100 mg daily and toprol xl 25 mg 1/2 daily.  Hyperlipidemia: on fish oil. Difficulty taking lipitor. Caused insomnia and nausea.  Osteoporosis: on alendronate 70 mg once weekly. Allergic rhinitis/conjunctivitis: pataday, flonase, and zyrtec.  GERD: on pepcid 40 mg daily and ginger. Controlling.  Eats healthy and exercises. Patient fell one week ago due to tripping over dog. Fractured left forearm. Surgery scheduled today.    Current Facility-Administered Medications on File Prior to Visit  Medication Dose Route Frequency Provider Last Rate Last Admin   0.9 % irrigation (POUR BTL)    PRN Leanora Cover, MD   300 mL at 05/11/22 1501   lactated ringers infusion   Intravenous Continuous Brennan Bailey, MD 10 mL/hr at 05/11/22 1209 Restarted at 05/11/22 1412   Current Outpatient Medications on File Prior to Visit  Medication Sig Dispense Refill   alendronate (FOSAMAX) 70 MG tablet TAKE ONE TABLET BY MOUTH EVERY FRIDAY. TAKE WITH A FULL GLASS OF WATER ON AN EMPTY STOMACH. STAY UPRIGHT FOR 30 MIN AFTER TAKING. 12 tablet 2   atorvastatin (LIPITOR) 10 MG tablet Take 1 tablet (10 mg total) by mouth at bedtime. (Patient taking differently: Take 10 mg by mouth 2 (two) times a week.) 90 tablet 1   b complex vitamins tablet Take 1 tablet by mouth every evening.     calcium citrate-vitamin D (CITRACAL+D) 315-200 MG-UNIT tablet Take 2 tablets by mouth 2 (two) times daily.     cetirizine (ZYRTEC) 10 MG tablet Take 5-10 mg by mouth daily as needed for allergies (during spring months).     Cholecalciferol (VITAMIN D) 2000 units tablet Take 2,000 Units by mouth in the morning.     famotidine (PEPCID) 40 MG tablet Take 1 tablet (40 mg total) by mouth daily. 90 tablet 1   fluticasone (FLONASE) 50  MCG/ACT nasal spray Place 1 spray into both nostrils daily as needed for allergies or rhinitis.     Ginger, Zingiber officinalis, (GINGER ROOT) 550 MG CAPS Take 1 capsule by mouth in the morning and at bedtime.     losartan (COZAAR) 100 MG tablet Take 1 tablet (100 mg total) by mouth daily. 90 tablet 0   metoprolol succinate (TOPROL-XL) 25 MG 24 hr tablet Take 0.5 tablets (12.5 mg total) by mouth daily. 45 tablet 0   Olopatadine HCl (PATADAY OP) Place 1 drop into both eyes daily as needed (allergy).     Omega-3 Fatty Acids (FISH OIL PO) Take 2,400 mg by mouth in the morning.     oxyCODONE-acetaminophen (PERCOCET) 5-325 MG tablet Take 1-2 tablets by mouth every 8 (eight) hours as needed for severe pain. 20 tablet 0   TURMERIC CURCUMIN PO Take 1,000 mg by mouth in the morning.     vitamin B-12 (CYANOCOBALAMIN) 1000 MCG tablet Take 1,000 mcg by mouth in the morning.     vitamin C (ASCORBIC ACID) 250 MG tablet Take 250 mg by mouth 2 (two) times daily.     Past Medical History:  Diagnosis Date   Age-related osteoporosis without current pathological fracture    Arthritis    Cancer ([place]) 04/2020   SCC skin cancer removed from nose   Cervical stenosis of spine    C11-12   Dysphagia    Esophageal  dilation x2   Essential (primary) hypertension    GERD (gastroesophageal reflux disease)    Hypertension    Mixed hyperlipidemia    Paroxysmal SVT (supraventricular tachycardia) (HCC)    PONV (postoperative nausea and vomiting)    Spondylolisthesis of lumbar region    Past Surgical History:  Procedure Laterality Date   ABDOMINAL HYSTERECTOMY     ANTERIOR AND POSTERIOR REPAIR N/A 03/20/2016   Procedure: REPAIR CYSTOCELE AND RECTOCELE;  Surgeon: Bjorn Loser, MD;  Location: [facility];  Service: Urology;  Laterality: N/A;   BACK SURGERY  2019   has hardware   BREAST BIOPSY Left 02/23/2022   COLONOSCOPY     ESOPHAGEAL DILATION  2005   x 2   MOHS SURGERY  04/2020   SCC skin cancer removed from  nose   VAGINAL HYSTERECTOMY Bilateral 03/20/2016   Procedure: TOTAL VAGINAL HYSTERECTOMY WITH BILATERAL SALPINGO OOPHORECTOMY mccall coloplasty;  Surgeon: Servando Salina, MD;  Location: [facility];  Service: Gynecology;  Laterality: Bilateral;   VAGINAL PROLAPSE REPAIR N/A 03/20/2016   Procedure: REPAIR VAULT PROLAPSE AND GRAFT;  Surgeon: Bjorn Loser, MD;  Location: [facility];  Service: Urology;  Laterality: N/A;    Family History  Problem Relation Age of Onset   Uterine cancer Mother    Breast cancer Mother    Lung disease Father    Breast cancer Sister    Breast cancer Maternal Aunt    Breast cancer Maternal Aunt    Breast cancer Maternal Aunt    Multiple myeloma Brother    Kidney cancer Brother    Social History   Socioeconomic History   Marital status: Married    Spouse name: [name]   Number of children: 2   Years of education: Not on file   Highest education level: Not on file  Occupational History   Occupation: Retired  Tobacco Use   Smoking status: Never   Smokeless tobacco: Never  Vaping Use   Vaping Use: Never used  Substance and Sexual Activity   Alcohol use: Not Currently    Alcohol/week: 1.0 standard drink of alcohol    Types: 1 Glasses of wine per week   Drug use: Never   Sexual activity: Yes    Birth control/protection: Post-menopausal, Surgical    Comment: hyst  Other Topics Concern   Not on file  Social History Narrative   Not on file   Social Determinants of Health   Financial Resource Strain: Low Risk  (06/16/2020)   Overall Financial Resource Strain (CARDIA)    Difficulty of Paying Living Expenses: Not hard at all  Food Insecurity: No Food Insecurity (06/16/2020)   Hunger Vital Sign    Worried About Running Out of Food in the Last Year: Never true    [place] in the Last Year: Never true  Transportation Needs: No Transportation Needs (06/16/2020)   PRAPARE - Hydrologist (Medical): No    Lack of  Transportation (Non-Medical): No  Physical Activity: Sufficiently Active (06/16/2020)   Exercise Vital Sign    Days of Exercise per Week: 5 days    Minutes of Exercise per Session: 30 min  Stress: No Stress Concern Present (06/16/2020)   [place]    Feeling of Stress : Not at all  Social Connections: [place] (06/16/2020)   Social Connection and Isolation Panel [NHANES]    Frequency of Communication with Friends and Family: More than three times a  week    Frequency of Social Gatherings with Friends and Family: Twice a week    Attends Religious Services: More than 4 times per year    Active Member of [employer] or Organizations: Yes    Attends Music therapist: More than 4 times per year    Marital Status: Married    Review of Systems  Constitutional:  Negative for chills, fatigue and fever.  HENT:  Positive for postnasal drip and rhinorrhea. Negative for congestion and sore throat.   Respiratory:  Negative for cough and shortness of breath.   Cardiovascular:  Negative for chest pain.  Gastrointestinal:  Negative for abdominal pain, constipation, diarrhea, nausea and vomiting.  Genitourinary:  Negative for dysuria and urgency.  Musculoskeletal:  Negative for back pain and myalgias.  Neurological:  Negative for dizziness, weakness, light-headedness and headaches.  Psychiatric/Behavioral:  Negative for dysphoric mood. The patient is not nervous/anxious.      Objective:  BP 124/72   Pulse 72   Temp (!) 95.7 F (35.4 C)   Resp 16   Ht 5' 0.5" (1.537 m)   Wt 117 lb (53.1 kg)   BMI 22.47 kg/m      05/11/2022    1:25 PM 05/11/2022    1:20 PM 05/11/2022    1:15 PM  BP/Weight  Systolic BP 458 099 833  Diastolic BP 64 74 74    Physical Exam Vitals reviewed.  Constitutional:      Appearance: Normal appearance. She is normal weight.  Neck:     Vascular: No carotid bruit.  Cardiovascular:      Rate and Rhythm: Normal rate and regular rhythm.     Heart sounds: Normal heart sounds.  Pulmonary:     Effort: Pulmonary effort is normal. No respiratory distress.     Breath sounds: Normal breath sounds.  Abdominal:     General: Abdomen is flat. Bowel sounds are normal.     Palpations: Abdomen is soft.     Tenderness: There is no abdominal tenderness.  Musculoskeletal:     Comments: Left arm splint  Neurological:     Mental Status: She is alert and oriented to person, place, and time.  Psychiatric:        Mood and Affect: Mood normal.        Behavior: Behavior normal.     Diabetic Foot Exam - Simple   No data filed      Lab Results  Component Value Date   WBC 5.6 05/11/2022   HGB 12.6 05/11/2022   HCT 38.0 05/11/2022   PLT 232 05/11/2022   GLUCOSE 91 05/11/2022   CHOL 200 (H) 11/06/2021   TRIG 74 11/06/2021   HDL 60 11/06/2021   LDLCALC 127 (H) 11/06/2021   ALT 26 05/11/2022   AST 33 05/11/2022   NA 138 05/11/2022   K 4.0 05/11/2022   CL 104 05/11/2022   CREATININE 0.70 05/11/2022   BUN 12 05/11/2022   CO2 27 05/11/2022   TSH 4.900 (H) 11/06/2021   INR 1.0 05/11/2022      Assessment & Plan:   Problem List Items Addressed This Visit       Cardiovascular and Mediastinum   Hypertension    Well controlled.  No changes to medicines  losartan 100 mg daily and toprol xl 25 mg 1/2 daily.  Continue to work on eating a healthy diet and exercise.          Other   Mixed hyperlipidemia  Recommend continue to work on eating healthy diet and exercise. Continue with Fish oil      Relevant Orders   Lipid panel   Bruising    Check labs.      Preoperative cardiovascular examination - Primary    Preop plan order stat.      Relevant Orders   CBC with Differential/Platelet (Completed)   Comprehensive metabolic panel (Completed)   EKG 12-Lead (Completed)   Protime-INR (Completed)  .  No orders of the defined types were placed in this  encounter.   Orders Placed This Encounter  Procedures   CBC with Differential/Platelet   Comprehensive metabolic panel   Protime-INR   Lipid panel   EKG 12-Lead    I,Sherlyn Ebbert,acting as a scribe for Rochel Brome, MD.,have documented all relevant documentation on the behalf of Rochel Brome, MD,as directed by  Rochel Brome, MD while in the presence of Rochel Brome, MD.   Follow-up: Return in about 6 months (around 11/09/2022) for chronic fasting.  An After Visit Summary was printed and given to the patient.  Rochel Brome, MD [facility] [phone]

## 2022-05-11 NOTE — Assessment & Plan Note (Signed)
Preop plan order stat.

## 2022-05-11 NOTE — Progress Notes (Signed)
Assisted Dr. Marcie Bal with left, interscalene , ultrasound guided block. Side rails up, monitors on throughout procedure. See vital signs in flow sheet. Tolerated Procedure well.

## 2022-05-11 NOTE — Anesthesia Procedure Notes (Signed)
Anesthesia Regional Block: Supraclavicular block   Pre-Anesthetic Checklist: , timeout performed,  Correct Patient, Correct Site, Correct Laterality,  Correct Procedure, Correct Position, site marked,  Risks and benefits discussed,  Surgical consent,  Pre-op evaluation,  At surgeon's request and post-op pain management  Laterality: Left  Prep: chloraprep       Needles:  Injection technique: Single-shot  Needle Type: Echogenic Stimulator Needle     Needle Length: 5cm  Needle Gauge: 22     Additional Needles:   Procedures:, nerve stimulator,,,,,     Nerve Stimulator or Paresthesia:  Response: biceps flexion, 0.45 mA  Additional Responses:   Narrative:  Start time: 05/11/2022 1:20 PM End time: 05/11/2022 1:26 PM Injection made incrementally with aspirations every 5 mL.  Performed by: Personally  Anesthesiologist: Albertha Ghee, MD  Additional Notes: Functioning IV was confirmed and monitors were applied.  A 26m 22ga Arrow echogenic stimulator needle was used. Sterile prep and drape,hand hygiene and sterile gloves were used.  Negative aspiration and negative test dose prior to incremental administration of local anesthetic. The patient tolerated the procedure well.  Ultrasound guidance: relevent anatomy identified, needle position confirmed, local anesthetic spread visualized around nerve(s), vascular puncture avoided.  Image printed for medical record.

## 2022-05-11 NOTE — Assessment & Plan Note (Signed)
>>  ASSESSMENT AND PLAN FOR HYPERTENSION WRITTEN ON 05/11/2022 12:58 PM BY LEAL-BORJAS, Kevina Piloto I, CMA  Well controlled.  No changes to medicines  losartan 100 mg daily and toprol xl 25 mg 1/2 daily.  Continue to work on eating a healthy diet and exercise.

## 2022-05-11 NOTE — Anesthesia Preprocedure Evaluation (Addendum)
Anesthesia Evaluation  Patient identified by MRN, date of birth, ID band Patient awake    Reviewed: Allergy & Precautions, H&P , NPO status , Patient's Chart, lab work & pertinent test results  History of Anesthesia Complications (+) PONV and history of anesthetic complications  Airway Mallampati: II   Neck ROM: full    Dental   Pulmonary neg pulmonary ROS,    breath sounds clear to auscultation       Cardiovascular hypertension, + dysrhythmias Supra Ventricular Tachycardia  Rhythm:regular Rate:Normal     Neuro/Psych  Neuromuscular disease    GI/Hepatic GERD  ,  Endo/Other    Renal/GU      Musculoskeletal  (+) Arthritis ,   Abdominal   Peds  Hematology   Anesthesia Other Findings   Reproductive/Obstetrics                             Anesthesia Physical Anesthesia Plan  ASA: 3  Anesthesia Plan: MAC and Regional   Post-op Pain Management:    Induction: Intravenous  PONV Risk Score and Plan: 3 and Propofol infusion, Treatment may vary due to age or medical condition and Ondansetron  Airway Management Planned: Simple Face Mask  Additional Equipment:   Intra-op Plan:   Post-operative Plan:   Informed Consent: I have reviewed the patients History and Physical, chart, labs and discussed the procedure including the risks, benefits and alternatives for the proposed anesthesia with the patient or authorized representative who has indicated his/her understanding and acceptance.     Dental advisory given  Plan Discussed with: CRNA, Anesthesiologist and Surgeon  Anesthesia Plan Comments:         Anesthesia Quick Evaluation

## 2022-05-11 NOTE — Discharge Instructions (Addendum)

## 2022-05-11 NOTE — Assessment & Plan Note (Signed)
Recommend continue to work on eating healthy diet and exercise. Continue with Fish oil

## 2022-05-11 NOTE — H&P (Signed)
Crystal Oneal is an 71 y.o. female.   Chief Complaint: wrist fracture HPI: 71 yo female states she fell on 05/03/22 injuring left wrist.  Seen in ED where XR revealed left distal radius fracture.  Splinted and followed up in office.  She wishes to proceed with operative reduction and fixation.  Allergies:  Allergies  Allergen Reactions   Pneumovax 23 [Pneumococcal Vac Polyvalent] Palpitations    tachycardia   Betadine [Povidone-Iodine] Other (See Comments)    "Pt reports that she had angry red blisters all over her back after a procedure that scrubbed with betadine in 2019."   Lisinopril Cough   Sulfa Antibiotics Rash    Past Medical History:  Diagnosis Date   Age-related osteoporosis without current pathological fracture    Arthritis    Cancer ([place]) 04/2020   SCC skin cancer removed from nose   Cervical stenosis of spine    C11-12   Dysphagia    Esophageal dilation x2   Essential (primary) hypertension    GERD (gastroesophageal reflux disease)    Hypertension    Mixed hyperlipidemia    Paroxysmal SVT (supraventricular tachycardia) (HCC)    PONV (postoperative nausea and vomiting)    Spondylolisthesis of lumbar region     Past Surgical History:  Procedure Laterality Date   ABDOMINAL HYSTERECTOMY     ANTERIOR AND POSTERIOR REPAIR N/A 03/20/2016   Procedure: REPAIR CYSTOCELE AND RECTOCELE;  Surgeon: Bjorn Loser, MD;  Location: [facility];  Service: Urology;  Laterality: N/A;   BACK SURGERY  2019   has hardware   BREAST BIOPSY Left 02/23/2022   COLONOSCOPY     ESOPHAGEAL DILATION  2005   x 2   MOHS SURGERY  04/2020   SCC skin cancer removed from nose   VAGINAL HYSTERECTOMY Bilateral 03/20/2016   Procedure: TOTAL VAGINAL HYSTERECTOMY WITH BILATERAL SALPINGO OOPHORECTOMY mccall coloplasty;  Surgeon: Servando Salina, MD;  Location: [facility];  Service: Gynecology;  Laterality: Bilateral;   VAGINAL PROLAPSE REPAIR N/A 03/20/2016   Procedure: REPAIR VAULT PROLAPSE AND  GRAFT;  Surgeon: Bjorn Loser, MD;  Location: [facility];  Service: Urology;  Laterality: N/A;    Family History: Family History  Problem Relation Age of Onset   Uterine cancer Mother    Breast cancer Mother    Lung disease Father    Breast cancer Sister    Breast cancer Maternal Aunt    Breast cancer Maternal Aunt    Breast cancer Maternal Aunt    Multiple myeloma Brother    Kidney cancer Brother     Social History:   reports that she has never smoked. She has never used smokeless tobacco. She reports that she does not currently use alcohol after a past usage of about 1.0 standard drink of alcohol per week. She reports that she does not use drugs.  Medications: Medications Prior to Admission  Medication Sig Dispense Refill   alendronate (FOSAMAX) 70 MG tablet TAKE ONE TABLET BY MOUTH EVERY FRIDAY. TAKE WITH A FULL GLASS OF WATER ON AN EMPTY STOMACH. STAY UPRIGHT FOR 30 MIN AFTER TAKING. 12 tablet 2   atorvastatin (LIPITOR) 10 MG tablet Take 1 tablet (10 mg total) by mouth at bedtime. (Patient taking differently: Take 10 mg by mouth 2 (two) times a week.) 90 tablet 1   b complex vitamins tablet Take 1 tablet by mouth every evening.     calcium citrate-vitamin D (CITRACAL+D) 315-200 MG-UNIT tablet Take 2 tablets by mouth 2 (two) times daily.  cetirizine (ZYRTEC) 10 MG tablet Take 5-10 mg by mouth daily as needed for allergies (during spring months).     Cholecalciferol (VITAMIN D) 2000 units tablet Take 2,000 Units by mouth in the morning.     famotidine (PEPCID) 40 MG tablet Take 1 tablet (40 mg total) by mouth daily. 90 tablet 1   fluticasone (FLONASE) 50 MCG/ACT nasal spray Place 1 spray into both nostrils daily as needed for allergies or rhinitis.     Ginger, Zingiber officinalis, (GINGER ROOT) 550 MG CAPS Take 1 capsule by mouth in the morning and at bedtime.     losartan (COZAAR) 100 MG tablet Take 1 tablet (100 mg total) by mouth daily. 90 tablet 0   metoprolol succinate  (TOPROL-XL) 25 MG 24 hr tablet Take 0.5 tablets (12.5 mg total) by mouth daily. 45 tablet 0   Olopatadine HCl (PATADAY OP) Place 1 drop into both eyes daily as needed (allergy).     Omega-3 Fatty Acids (FISH OIL PO) Take 2,400 mg by mouth in the morning.     oxyCODONE-acetaminophen (PERCOCET) 5-325 MG tablet Take 1-2 tablets by mouth every 8 (eight) hours as needed for severe pain. 20 tablet 0   TURMERIC CURCUMIN PO Take 1,000 mg by mouth in the morning.     vitamin B-12 (CYANOCOBALAMIN) 1000 MCG tablet Take 1,000 mcg by mouth in the morning.     vitamin C (ASCORBIC ACID) 250 MG tablet Take 250 mg by mouth 2 (two) times daily.      Results for orders placed or performed in visit on 05/11/22 (from the past 48 hour(s))  CBC with Differential/Platelet     Status: None   Collection Time: 05/11/22  8:18 AM  Result Value Ref Range   WBC 5.6 3.4 - 10.8 x10E3/uL   RBC 4.30 3.77 - 5.28 x10E6/uL   Hemoglobin 12.6 11.1 - 15.9 g/dL   Hematocrit 38.0 34.0 - 46.6 %   MCV 88 79 - 97 fL   MCH 29.3 26.6 - 33.0 pg   MCHC 33.2 31.5 - 35.7 g/dL   RDW 13.5 11.7 - 15.4 %   Platelets 232 150 - 450 x10E3/uL   Neutrophils 64 Not Estab. %   Lymphs 22 Not Estab. %   Monocytes 11 Not Estab. %   Eos 3 Not Estab. %   Basos 0 Not Estab. %   Neutrophils Absolute 3.6 1.4 - 7.0 x10E3/uL   Lymphocytes Absolute 1.2 0.7 - 3.1 x10E3/uL   Monocytes Absolute 0.6 0.1 - 0.9 x10E3/uL   EOS (ABSOLUTE) 0.2 0.0 - 0.4 x10E3/uL   Basophils Absolute 0.0 0.0 - 0.2 x10E3/uL  Comprehensive metabolic panel     Status: Abnormal   Collection Time: 05/11/22  8:18 AM  Result Value Ref Range   Glucose 91 70 - 99 mg/dL   BUN 12 8 - 27 mg/dL   Creatinine, Ser 0.70 0.57 - 1.00 mg/dL   eGFR 93 >59 mL/min/1.73   BUN/Creatinine Ratio 17 12 - 28   Sodium 138 134 - 144 mmol/L   Potassium 4.0 3.5 - 5.2 mmol/L   Chloride 104 96 - 106 mmol/L   CO2 27 20 - 29 mmol/L   Calcium 9.6 8.7 - 10.3 mg/dL   Total Protein 7.0 6.0 - 8.5 g/dL    Albumin 4.3 3.9 - 4.9 g/dL   Globulin, Total 2.7 1.5 - 4.5 g/dL   Albumin/Globulin Ratio 1.6 1.2 - 2.2   Bilirubin Total 0.6 0.0 - 1.2 mg/dL   Alkaline  Phosphatase 35 (L) 44 - 121 IU/L   AST 33 0 - 40 IU/L   ALT 26 0 - 32 IU/L  Protime-INR     Status: None   Collection Time: 05/11/22  8:18 AM  Result Value Ref Range   INR 1.0 0.9 - 1.2    Comment: Reference interval is for non-anticoagulated patients. Suggested INR therapeutic range for Vitamin K antagonist therapy:    Standard Dose (moderate intensity                   therapeutic range):       2.0 - 3.0    Higher intensity therapeutic range       2.5 - 3.5    Prothrombin Time 10.1 9.1 - 12.0 sec    No results found.    Blood pressure (!) 162/76, pulse 79, temperature 97.7 F (36.5 C), temperature source Oral, resp. rate 18, height 5' 0.5" (1.537 m), weight 52.5 kg, SpO2 99 %.  General appearance: alert, cooperative, and appears stated age Head: Normocephalic, without obvious abnormality, atraumatic Neck: supple, symmetrical, trachea midline Extremities: Intact sensation and capillary refill all digits.  +epl/fpl/io.  No wounds.  Pulses: 2+ and symmetric Skin: Skin color, texture, turgor normal. No rashes or lesions Neurologic: Grossly normal Incision/Wound: none  Assessment/Plan Left distal radius fracture.  Non operative and operative treatment options have been discussed with the patient and patient wishes to proceed with operative treatment. Risks, benefits and alternatives of surgery were discussed including risks of blood loss, infection, damage to nerves/vessels/tendons/ligament/bone, failure of surgery, need for additional surgery, complication with wound healing, stiffness, nonunion, malunion.  She voiced understanding of these risks and elected to proceed.    Leanora Cover 05/11/2022, 1:44 PM

## 2022-05-11 NOTE — Assessment & Plan Note (Addendum)
Well controlled.  No changes to medicines  losartan 100 mg daily and toprol xl 25 mg 1/2 daily.  Continue to work on eating a healthy diet and exercise.

## 2022-05-11 NOTE — Assessment & Plan Note (Signed)
Check labs 

## 2022-05-11 NOTE — Op Note (Addendum)
05/11/2022  [facility]  Operative Note  Pre Op Diagnosis: Left comminuted intraarticular distal radius fracture  Post Op Diagnosis: Left comminuted intraarticular distal radius fracture  Procedure:  ORIF Left comminuted intraarticular distal radius fracture, 2 intraarticular fragments Left brachioradialis release  Surgeon: Crystal Cover, MD  Assistant: Leverne Humbles, [facility]  Anesthesia: Regional with sedation  Fluids: Per anesthesia flow sheet  EBL: minimal  Complications: None  Specimen: None  Tourniquet Time:  Total Tourniquet Time Documented: Upper Arm (Left) - 41 minutes Total: Upper Arm (Left) - 41 minutes   Disposition: Stable to PACU  INDICATIONS:  Crystal Oneal is a 71 y.o. female that she fell from standing height May 03, 2022 injuring her left wrist.  She was seen in the emergency department where radiographs were taken revealing distal radius fracture.  She was placed in a splint and followed up in the office.  She wishes to proceed with operative reduction and fixation.  We discussed nonoperative and operative treatment options.  She wished to proceed with operative fixation.  Risks, benefits, and alternatives of surgery were discussed including the risk of blood loss; infection; damage to nerves, vessels, tendons, ligaments, bone; failure of surgery; need for additional surgery; complications with wound healing; continued pain; nonunion; malunion; stiffness.  We also discussed the possible need for bone graft and the benefits and risks including the possibility of disease transmission.  She voiced understanding of these risks and elected to proceed.    OPERATIVE COURSE:  After being identified preoperatively by myself, the patient and I agreed upon the procedure and site of procedure.  Surgical site was marked.   Surgical consent had been signed.  She was given IV Ancef as preoperative antibiotic prophylaxis.  She was transferred to the operating  room and placed on the operating room table in supine position with the Left upper extremity on an armboard. Sedation was induced by the anesthesiologist.  A regional block had been performed by anesthesia in preoperative holding.  The Left upper extremity was prepped and draped in normal sterile orthopedic fashion.  A surgical pause was performed between the surgeons, anesthesia and operating room staff, and all were in agreement as to the patient, procedure and site of procedure.  Tourniquet at the proximal aspect of the extremity was inflated to 250 mmHg after exsanguination of the limb with an Esmarch bandage.  Standard volar Mallie Mussel approach was used.  The bipolar electrocautery was used to obtain hemostasis.  The superficial and deep portions of the FCR tendon sheath were incised, and the FCR and FPL were swept ulnarly to protect the palmar cutaneous branch of the median nerve.  The brachioradialis was released at the radial side of the radius.  The pronator quadratus was released and elevated with the periosteal elevator.  The fracture site was identified and cleared of soft tissue interposition and hematoma.  It was reduced under direct visualization.  An [employer] volar distal radial locking plate was selected.  It was secured to the bone with the guidepins.  C-arm was used in AP and lateral projections to ensure appropriate reduction and position of the hardware and adjustments made as necessary.  Standard AO drilling and measuring technique was used.  A single screw was placed in the slotted hole in the shaft of the plate.  The distal holes were filled with locking pegs with the exception of the styloid holes, which were filled with locking screws.  The remaining holes in the shaft of the plate were  filled with nonlocking screws.  Good purchase was obtained.  C-arm was used in AP, lateral and oblique projections to ensure appropriate reduction and position of hardware, which was the case.  There was no  intra-articular penetration of hardware.  The wound was copiously irrigated with sterile saline.  Pronator quadratus was repaired back over top of the plate using 4-0 Vicryl suture.  Vicryl suture was placed in the subcutaneous tissues in an inverted interrupted fashion and the skin was closed with 4-0 nylon in a horizontal mattress fashion.  There was good pronation and supination of the wrist without crepitance.  The wound was then dressed with sterile Xeroform, 4x4s, and wrapped with a Kerlix bandage.  A volar splint was placed and wrapped with Kerlix and Ace bandage.  Tourniquet was deflated at 41 minutes.  Fingertips were pink with brisk capillary refill after deflation of the tourniquet.  Operative drapes were broken down.  The patient was awoken from anesthesia safely.  She was transferred back to the stretcher and taken to the PACU in stable condition.  I will see her back in the office in one week for postoperative followup.  I will give her a prescription for Percocet 5/325 1-2 tabs PO q6 hours prn pain, dispense # 20.    Crystal Cover, MD Electronically signed, 05/11/22

## 2022-05-11 NOTE — Transfer of Care (Signed)
Immediate Anesthesia Transfer of Care Note  Patient: Crystal Oneal  Procedure(s) Performed: OPEN REDUCTION INTERNAL FIXATION (ORIF) LEFT DISTAL RADIUS FRACTURE (Left: Wrist)  Patient Location: PACU  Anesthesia Type:MAC and Regional  Level of Consciousness: drowsy, patient cooperative and responds to stimulation  Airway & Oxygen Therapy: Patient Spontanous Breathing and Patient connected to face mask oxygen  Post-op Assessment: Report given to RN and Post -op Vital signs reviewed and stable  Post vital signs: Reviewed and stable  Last Vitals:  Vitals Value Taken Time  BP    Temp    Pulse 88 05/11/22 1516  Resp 18 05/11/22 1516  SpO2 98 % 05/11/22 1516  Vitals shown include unvalidated device data.  Last Pain:  Vitals:   05/11/22 1206  TempSrc: Oral  PainSc: 3       Patients Stated Pain Goal: 3 (47/65/46 [id number])  Complications: No notable events documented.

## 2022-05-11 NOTE — Anesthesia Procedure Notes (Signed)
Procedure Name: MAC Date/Time: 05/11/2022 2:12 PM  Performed by: Glory Buff, CRNAOxygen Delivery Method: Simple face mask

## 2022-05-12 ENCOUNTER — Encounter: Payer: Self-pay | Admitting: Family Medicine

## 2022-05-12 DIAGNOSIS — S5292XA Unspecified fracture of left forearm, initial encounter for closed fracture: Secondary | ICD-10-CM | POA: Insufficient documentation

## 2022-05-12 LAB — LIPID PANEL
Chol/HDL Ratio: 2.5 ratio (ref 0.0–4.4)
Cholesterol, Total: 182 mg/dL (ref 100–199)
HDL: 72 mg/dL (ref >39)
LDL Chol Calc (NIH): 100 mg/dL — ABNORMAL HIGH (ref 0–99)
Triglycerides: 51 mg/dL (ref 0–149)
VLDL Cholesterol Cal: 10 mg/dL (ref 5–40)

## 2022-05-12 LAB — CARDIOVASCULAR RISK ASSESSMENT

## 2022-05-12 NOTE — Assessment & Plan Note (Signed)
Surgery later today.

## 2022-05-12 NOTE — Anesthesia Postprocedure Evaluation (Signed)
Anesthesia Post Note  Patient: Crystal Oneal  Procedure(s) Performed: OPEN REDUCTION INTERNAL FIXATION (ORIF) LEFT DISTAL RADIUS FRACTURE (Left: Wrist)     Patient location during evaluation: PACU Anesthesia Type: Regional and MAC Level of consciousness: awake and alert Pain management: pain level controlled Vital Signs Assessment: post-procedure vital signs reviewed and stable Respiratory status: spontaneous breathing, nonlabored ventilation, respiratory function stable and patient connected to nasal cannula oxygen Cardiovascular status: stable and blood pressure returned to baseline Postop Assessment: no apparent nausea or vomiting Anesthetic complications: no   No notable events documented.  Last Vitals:  Vitals:   05/11/22 1530 05/11/22 1547  BP: 129/76 (!) 155/88  Pulse: 73 83  Resp: 13 16  Temp:  (!) 36.2 C  SpO2: 95% 96%    Last Pain:  Vitals:   05/11/22 1547  TempSrc:   PainSc: 0-No pain                 Damari Suastegui S

## 2022-05-14 ENCOUNTER — Encounter (HOSPITAL_BASED_OUTPATIENT_CLINIC_OR_DEPARTMENT_OTHER): Payer: Self-pay | Admitting: Orthopedic Surgery

## 2022-05-14 NOTE — Addendum Note (Signed)
Addendum  created 05/14/22 1034 by Talesha Ellithorpe, Ernesta Amble, CRNA   Charge Capture section accepted

## 2022-05-16 DIAGNOSIS — M25632 Stiffness of left wrist, not elsewhere classified: Secondary | ICD-10-CM | POA: Diagnosis not present

## 2022-05-16 DIAGNOSIS — S52572D Other intraarticular fracture of lower end of left radius, subsequent encounter for closed fracture with routine healing: Secondary | ICD-10-CM | POA: Diagnosis not present

## 2022-05-16 DIAGNOSIS — S52572A Other intraarticular fracture of lower end of left radius, initial encounter for closed fracture: Secondary | ICD-10-CM | POA: Diagnosis not present

## 2022-05-16 DIAGNOSIS — M25532 Pain in left wrist: Secondary | ICD-10-CM | POA: Diagnosis not present

## 2022-05-30 ENCOUNTER — Ambulatory Visit (INDEPENDENT_AMBULATORY_CARE_PROVIDER_SITE_OTHER): Payer: PPO

## 2022-05-30 DIAGNOSIS — M810 Age-related osteoporosis without current pathological fracture: Secondary | ICD-10-CM

## 2022-05-30 MED ORDER — DENOSUMAB 60 MG/ML ~~LOC~~ SOSY
60.0000 mg | PREFILLED_SYRINGE | Freq: Once | SUBCUTANEOUS | Status: AC
  Administered 2022-05-30: 60 mg via SUBCUTANEOUS

## 2022-06-01 ENCOUNTER — Encounter: Payer: Self-pay | Admitting: Cardiology

## 2022-06-01 ENCOUNTER — Ambulatory Visit: Payer: PPO | Attending: Cardiology | Admitting: Cardiology

## 2022-06-01 DIAGNOSIS — I10 Essential (primary) hypertension: Secondary | ICD-10-CM | POA: Diagnosis not present

## 2022-06-01 DIAGNOSIS — E782 Mixed hyperlipidemia: Secondary | ICD-10-CM | POA: Diagnosis not present

## 2022-06-01 MED ORDER — LOSARTAN POTASSIUM 100 MG PO TABS: 100.0000 mg | ORAL_TABLET | Freq: Every day | ORAL | 3 refills | Status: DC

## 2022-06-01 MED ORDER — METOPROLOL SUCCINATE ER 25 MG PO TB24: 12.5000 mg | ORAL_TABLET | Freq: Every day | ORAL | 3 refills | Status: DC

## 2022-06-01 MED ORDER — ATORVASTATIN CALCIUM 10 MG PO TABS: 10.0000 mg | ORAL_TABLET | ORAL | 3 refills | Status: DC

## 2022-06-01 NOTE — Progress Notes (Signed)
Cardiology Office Note:    Date:  06/01/2022   ID:  CARENA STREAM, DOB 07/06/1951, MRN [id number]  PCP:  Rochel Brome, MD  Cardiologist:  Berniece Salines, DO  Electrophysiologist:  None   Referring MD: Rochel Brome, MD   No chief complaint on file.  I am doing fine  History of Present Illness:    Crystal Oneal is a 71 y.o. female with a hx of hypertension, hyperlipidemia, paroxysmal SVT is here today for follow-up visit.  Did see the patient April 2022 at that time we discussed her ZIO monitor results which show paroxysmal SVT.  We also talked about her coronary CTA as well as her echocardiogram.  For paroxysmal SVT we started patient on low-dose Toprol-XL 12.5 mg daily.  We continued her on her losartan as well as her hydrochlorothiazide.  At her last visit on January 11, 2021 the patient reported that at home her blood pressure has been running in the 220U systolic to the 542H.  Because I did asked the patient to take her blood pressure daily and did not change medication despite systolic blood pressure in the 150s here.  I saw the patient in July 2022 at that time I did check her blood pressure manually and with her home cuff the online.  I reviewed her blood pressure log from home.  Suspect highly that she does have whitecoat hypertension.  No medication was changed. She did tell me that her symptoms have been improved with the beta-blocker.  Since I saw the patient she has been doing well.  She offers no complaints at this time.  Of note she had an accident where she fell playing with a puppy.  And broke her left forearm.  She is in a cast today.  Past Medical History:  Diagnosis Date   Age-related osteoporosis without current pathological fracture    Arthritis    Cancer ([place]) 04/2020   SCC skin cancer removed from nose   Cervical stenosis of spine    C11-12   Dysphagia    Esophageal dilation x2   Essential (primary) hypertension    GERD (gastroesophageal reflux disease)     Hypertension    Mixed hyperlipidemia    Paroxysmal SVT (supraventricular tachycardia)    PONV (postoperative nausea and vomiting)    Spondylolisthesis of lumbar region     Past Surgical History:  Procedure Laterality Date   ABDOMINAL HYSTERECTOMY     ANTERIOR AND POSTERIOR REPAIR N/A 03/20/2016   Procedure: REPAIR CYSTOCELE AND RECTOCELE;  Surgeon: Bjorn Loser, MD;  Location: [facility];  Service: Urology;  Laterality: N/A;   BACK SURGERY  2019   has hardware   BREAST BIOPSY Left 02/23/2022   COLONOSCOPY     ESOPHAGEAL DILATION  2005   x 2   MOHS SURGERY  04/2020   SCC skin cancer removed from nose   OPEN REDUCTION INTERNAL FIXATION (ORIF) DISTAL RADIAL FRACTURE Left 05/11/2022   Procedure: OPEN REDUCTION INTERNAL FIXATION (ORIF) LEFT DISTAL RADIUS FRACTURE;  Surgeon: Leanora Cover, MD;  Location: [facility];  Service: Orthopedics;  Laterality: Left;  90 MIN   VAGINAL HYSTERECTOMY Bilateral 03/20/2016   Procedure: TOTAL VAGINAL HYSTERECTOMY WITH BILATERAL SALPINGO OOPHORECTOMY mccall coloplasty;  Surgeon: Servando Salina, MD;  Location: [facility];  Service: Gynecology;  Laterality: Bilateral;   VAGINAL PROLAPSE REPAIR N/A 03/20/2016   Procedure: REPAIR VAULT PROLAPSE AND GRAFT;  Surgeon: Bjorn Loser, MD;  Location: [facility];  Service: Urology;  Laterality: N/A;  Current Medications: Current Meds  Medication Sig   b complex vitamins tablet Take 1 tablet by mouth every evening.   calcium citrate-vitamin D (CITRACAL+D) 315-200 MG-UNIT tablet Take 2 tablets by mouth 2 (two) times daily.   cetirizine (ZYRTEC) 10 MG tablet Take 5-10 mg by mouth daily as needed for allergies (during spring months).   Cholecalciferol (VITAMIN D) 2000 units tablet Take 2,000 Units by mouth in the morning.   denosumab (PROLIA) 60 MG/ML SOSY injection Inject 60 mg into the skin every 6 (six) months.   famotidine (PEPCID) 40 MG tablet Take 1 tablet (40 mg total) by mouth daily.    fluticasone (FLONASE) 50 MCG/ACT nasal spray Place 1 spray into both nostrils daily as needed for allergies or rhinitis.   Ginger, Zingiber officinalis, (GINGER ROOT) 550 MG CAPS Take 1 capsule by mouth in the morning and at bedtime.   Olopatadine HCl (PATADAY OP) Place 1 drop into both eyes daily as needed (allergy).   Omega-3 Fatty Acids (FISH OIL PO) Take 2,400 mg by mouth in the morning.   oxyCODONE-acetaminophen (PERCOCET) 5-325 MG tablet 1-2 tabs po q6 hours prn pain   TURMERIC CURCUMIN PO Take 1,000 mg by mouth in the morning.   vitamin B-12 (CYANOCOBALAMIN) 1000 MCG tablet Take 1,000 mcg by mouth in the morning.   vitamin C (ASCORBIC ACID) 250 MG tablet Take 250 mg by mouth 2 (two) times daily.   [DISCONTINUED] atorvastatin (LIPITOR) 10 MG tablet Take 1 tablet (10 mg total) by mouth at bedtime. (Patient taking differently: Take 10 mg by mouth 2 (two) times a week.)   [DISCONTINUED] losartan (COZAAR) 100 MG tablet Take 1 tablet (100 mg total) by mouth daily.   [DISCONTINUED] metoprolol succinate (TOPROL-XL) 25 MG 24 hr tablet Take 0.5 tablets (12.5 mg total) by mouth daily.     Allergies:   Pneumovax 23 [pneumococcal vac polyvalent], Betadine [povidone-iodine], Lisinopril, and Sulfa antibiotics   Social History   Socioeconomic History   Marital status: Married    Spouse name: [name]   Number of children: 2   Years of education: Not on file   Highest education level: Not on file  Occupational History   Occupation: Retired  Tobacco Use   Smoking status: Never   Smokeless tobacco: Never  Vaping Use   Vaping Use: Never used  Substance and Sexual Activity   Alcohol use: Not Currently    Alcohol/week: 1.0 standard drink of alcohol    Types: 1 Glasses of wine per week   Drug use: Never   Sexual activity: Yes    Birth control/protection: Post-menopausal, Surgical    Comment: hyst  Other Topics Concern   Not on file  Social History Narrative   Not on file   Social  Determinants of Health   Financial Resource Strain: Low Risk  (06/16/2020)   Overall Financial Resource Strain (CARDIA)    Difficulty of Paying Living Expenses: Not hard at all  Food Insecurity: No Food Insecurity (06/16/2020)   Hunger Vital Sign    Worried About Running Out of Food in the Last Year: Never true    [place] in the Last Year: Never true  Transportation Needs: No Transportation Needs (06/16/2020)   PRAPARE - Hydrologist (Medical): No    Lack of Transportation (Non-Medical): No  Physical Activity: Sufficiently Active (06/16/2020)   Exercise Vital Sign    Days of Exercise per Week: 5 days    Minutes of Exercise  per Session: 30 min  Stress: No Stress Concern Present (06/16/2020)   [place]    Feeling of Stress : Not at all  Social Connections: [place] (06/16/2020)   Social Connection and Isolation Panel [NHANES]    Frequency of Communication with Friends and Family: More than three times a week    Frequency of Social Gatherings with Friends and Family: Twice a week    Attends Religious Services: More than 4 times per year    Active Member of [employer] or Organizations: Yes    Attends Music therapist: More than 4 times per year    Marital Status: Married     Family History: The patient's family history includes Breast cancer in her maternal aunt, maternal aunt, maternal aunt, mother, and sister; Kidney cancer in her brother; Lung disease in her father; Multiple myeloma in her brother; Uterine cancer in her mother.  ROS:   Review of Systems  Constitution: Negative for decreased appetite, fever and weight gain.  HENT: Negative for congestion, ear discharge, hoarse voice and sore throat.   Eyes: Negative for discharge, redness, vision loss in right eye and visual halos.  Cardiovascular: Negative for chest pain, dyspnea on exertion, leg swelling,  orthopnea and palpitations.  Respiratory: Negative for cough, hemoptysis, shortness of breath and snoring.   Endocrine: Negative for heat intolerance and polyphagia.  Hematologic/Lymphatic: Negative for bleeding problem. Does not bruise/bleed easily.  Skin: Negative for flushing, nail changes, rash and suspicious lesions.  Musculoskeletal: Negative for arthritis, joint pain, muscle cramps, myalgias, neck pain and stiffness.  Gastrointestinal: Negative for abdominal pain, bowel incontinence, diarrhea and excessive appetite.  Genitourinary: Negative for decreased libido, genital sores and incomplete emptying.  Neurological: Negative for brief paralysis, focal weakness, headaches and loss of balance.  Psychiatric/Behavioral: Negative for altered mental status, depression and suicidal ideas.  Allergic/Immunologic: Negative for HIV exposure and persistent infections.    EKGs/Labs/Other Studies Reviewed:    The following studies were reviewed today:   EKG: None today   Zio monitor  Patch Wear Time:  6 days and 0 hours (2022-03-03T11:48:50-0500 to 2022-03-09T12:08:55-0500)   Patient had a min HR of 53 bpm, max HR of 158 bpm, and avg HR of 77 bpm. Predominant underlying rhythm was Sinus Rhythm. First Degree AV Block was present. 2 Supraventricular Tachycardia runs occurred, the run with the fastest interval lasting 5 beats with a max rate of 158 bpm, the longest lasting 6 beats with an avg rate of 105 bpm. Isolated SVEs were rare (<1.0%), SVE Triplets were rare (<1.0%), and no SVE Couplets were present. Isolated VEs were rare (<1.0%), and no VE Couplets or VE Triplets were  present.   There were no pauses of 3 seconds or greater and no episodes of second or third-degree AV nodal block or sinus node exit block. There were 6 triggered and 6 diary events all sinus rhythm. Ventricular ectopy was rare. Supraventricular ectopy was rare.   Conclusion unremarkable event monitor.     TTE 10/30/20   IMPRESSIONS   1. Left ventricular ejection fraction, by estimation, is 60 to 65%. The left ventricle has normal function. The left ventricle has no regional wall motion abnormalities. There is mild concentric left ventricular hypertrophy. Left ventricular diastolic parameters are consistent with Grade I diastolic dysfunction (impaired relaxation).   2. Right ventricular systolic function is normal. The right ventricular size is normal. Tricuspid regurgitation signal is inadequate for assessing PA pressure.  3. The mitral valve is normal in structure. Trivial mitral valve regurgitation. No evidence of mitral stenosis.   4. The aortic valve was not well visualized. Aortic valve regurgitation is not visualized. No aortic stenosis is present. Aortic valve area, by VTI measures 2.60 cm. Aortic valve mean gradient measures 3.0 mmHg. Aortic valve Vmax measures 1.01 m/s.   5. The inferior vena cava is normal in size with greater than 50% respiratory variability, suggesting right atrial pressure of 3 mmHg.   FINDINGS   Left Ventricle: Left ventricular ejection fraction, by estimation, is 60  to 65%. The left ventricle has normal function. The left ventricle has no  regional wall motion abnormalities. The left ventricular internal cavity  size was normal in size. There is   mild concentric left ventricular hypertrophy. Left ventricular diastolic  parameters are consistent with Grade I diastolic dysfunction (impaired  relaxation). Normal left ventricular filling pressure.   Right Ventricle: The right ventricular size is normal. No increase in  right ventricular wall thickness. Right ventricular systolic function is  normal. Tricuspid regurgitation signal is inadequate for assessing PA  pressure.   Left Atrium: Left atrial size was normal in size.   Right Atrium: Right atrial size was normal in size.   Pericardium: There is no evidence of pericardial effusion.   Mitral Valve: The mitral valve is  normal in structure. Trivial mitral  valve regurgitation. No evidence of mitral valve stenosis. MV peak  gradient, 5.6 mmHg. The mean mitral valve gradient is 2.0 mmHg.   Tricuspid Valve: The tricuspid valve is normal in structure. Tricuspid  valve regurgitation is trivial. No evidence of tricuspid stenosis.   Aortic Valve: The aortic valve was not well visualized. Aortic valve  regurgitation is not visualized. No aortic stenosis is present. Aortic  valve mean gradient measures 3.0 mmHg. Aortic valve peak gradient measures  4.1 mmHg. Aortic valve area, by VTI  measures 2.60 cm.   Pulmonic Valve: The pulmonic valve was normal in structure. Pulmonic valve  regurgitation is not visualized. No evidence of pulmonic stenosis.   Aorta: The aortic root is normal in size and structure.   Venous: The inferior vena cava is normal in size with greater than 50%  respiratory variability, suggesting right atrial pressure of 3 mmHg.   IAS/Shunts: No atrial level shunt detected by color flow Doppler.   Recent Labs: 11/06/2021: TSH 4.900 05/11/2022: ALT 26; BUN 12; Creatinine, Ser 0.70; Hemoglobin 12.6; Platelets 232; Potassium 4.0; Sodium 138  Recent Lipid Panel    Component Value Date/Time   CHOL 182 05/11/2022 0819   TRIG 51 05/11/2022 0819   HDL 72 05/11/2022 0819   CHOLHDL 2.5 05/11/2022 0819   LDLCALC 100 (H) 05/11/2022 0819    Physical Exam:    VS:  BP (!) 144/90   Pulse 62   Ht 5' 0.5" (1.537 m)   Wt 115 lb 12.8 oz (52.5 kg)   SpO2 98%   BMI 22.24 kg/m     Wt Readings from Last 3 Encounters:  06/01/22 115 lb 12.8 oz (52.5 kg)  05/11/22 115 lb 11.9 oz (52.5 kg)  05/11/22 117 lb (53.1 kg)     GEN: Well nourished, well developed in no acute distress HEENT: Normal NECK: No JVD; No carotid bruits LYMPHATICS: No lymphadenopathy CARDIAC: S1S2 noted,RRR, no murmurs, rubs, gallops RESPIRATORY:  Clear to auscultation without rales, wheezing or rhonchi  ABDOMEN: Soft, non-tender,  non-distended, +bowel sounds, no guarding. EXTREMITIES: No edema, No cyanosis, no clubbing MUSCULOSKELETAL:  No deformity  SKIN: Warm and dry NEUROLOGIC:  Alert and oriented x 3, non-focal PSYCHIATRIC:  Normal affect, good insight  ASSESSMENT:    1. Mixed hyperlipidemia   2. Uncontrolled hypertension     PLAN:     1.  Her blood pressure is elevated in the office-but her home blood pressure readings are within normal.  No changes will be made to her antihypertensive medication.  2.  We will continue patient on her beta-blocker.   The patient is in agreement with the above plan. The patient left the office in stable condition.  The patient will follow up in 1 year.   Medication Adjustments/Labs and Tests Ordered: Current medicines are reviewed at length with the patient today.  Concerns regarding medicines are outlined above.  No orders of the defined types were placed in this encounter.   Meds ordered this encounter  Medications   atorvastatin (LIPITOR) 10 MG tablet    Sig: Take 1 tablet (10 mg total) by mouth 2 (two) times a week.    Dispense:  26 tablet    Refill:  3   metoprolol succinate (TOPROL-XL) 25 MG 24 hr tablet    Sig: Take 0.5 tablets (12.5 mg total) by mouth daily.    Dispense:  45 tablet    Refill:  3   losartan (COZAAR) 100 MG tablet    Sig: Take 1 tablet (100 mg total) by mouth daily.    Dispense:  90 tablet    Refill:  3     Patient Instructions  Medication Instructions:  Your physician recommends that you continue on your current medications as directed. Please refer to the Current Medication list given to you today.  *If you need a refill on your cardiac medications before your next appointment, please call your pharmacy*   Lab Work: NONE If you have labs (blood work) drawn today and your tests are completely normal, you will receive your results only by: [place] (if you have MyChart) OR A paper copy in the mail If you have any lab  test that is abnormal or we need to change your treatment, we will call you to review the results.   Testing/Procedures: NONE   Follow-Up: At [facility], you and your health needs are our priority.  As part of our continuing mission to provide you with exceptional heart care, we have created designated Provider Care Teams.  These Care Teams include your primary Cardiologist (physician) and Advanced Practice Providers (APPs -  Physician Assistants and Nurse Practitioners) who all work together to provide you with the care you need, when you need it.  We recommend signing up for the patient portal called "MyChart".  Sign up information is provided on this After Visit Summary.  MyChart is used to connect with patients for Virtual Visits (Telemedicine).  Patients are able to view lab/test results, encounter notes, upcoming appointments, etc.  Non-urgent messages can be sent to your provider as well.   To learn more about what you can do with MyChart, go to [email or web address].    Your next appointment:   1 year(s)  The format for your next appointment:   In Person  Provider:   Berniece Salines, DO      Adopting a Healthy Lifestyle.  Know what a healthy weight is for you (roughly BMI <25) and aim to maintain this   Aim for 7+ servings of fruits and vegetables daily   65-80+ fluid ounces of water or unsweet tea for  healthy kidneys   Limit to max 1 drink of alcohol per day; avoid smoking/tobacco   Limit animal fats in diet for cholesterol and heart health - choose grass fed whenever available   Avoid highly processed foods, and foods high in saturated/trans fats   Aim for low stress - take time to unwind and care for your mental health   Aim for 150 min of moderate intensity exercise weekly for heart health, and weights twice weekly for bone health   Aim for 7-9 hours of sleep daily   When it comes to diets, agreement about the perfect plan isnt easy to find, even among  the experts. Experts at the [place] [place] developed an idea known as the Healthy Eating Plate. Just imagine a plate divided into logical, healthy portions.   The emphasis is on diet quality:   Load up on vegetables and fruits - one-half of your plate: Aim for color and variety, and remember that potatoes dont count.   Go for whole grains - one-quarter of your plate: Whole wheat, barley, wheat berries, quinoa, oats, brown rice, and foods made with them. If you want pasta, go with whole wheat pasta.   Protein power - one-quarter of your plate: Fish, chicken, beans, and nuts are all healthy, versatile protein sources. Limit red meat.   The diet, however, does go beyond the plate, offering a few other suggestions.   Use healthy plant oils, such as olive, canola, soy, corn, sunflower and peanut. Check the labels, and avoid partially hydrogenated oil, which have unhealthy trans fats.   If youre thirsty, drink water. Coffee and tea are good in moderation, but skip sugary drinks and limit milk and dairy products to one or two daily servings.   The type of carbohydrate in the diet is more important than the amount. Some sources of carbohydrates, such as vegetables, fruits, whole grains, and beans-are healthier than others.   Finally, stay active  Signed, Berniece Salines, DO  06/01/2022 9:08 PM    [facility]

## 2022-06-01 NOTE — Patient Instructions (Signed)
Medication Instructions:  Your physician recommends that you continue on your current medications as directed. Please refer to the Current Medication list given to you today.  *If you need a refill on your cardiac medications before your next appointment, please call your pharmacy*   Lab Work: NONE If you have labs (blood work) drawn today and your tests are completely normal, you will receive your results only by: MyChart Message (if you have MyChart) OR A paper copy in the mail If you have any lab test that is abnormal or we need to change your treatment, we will call you to review the results.   Testing/Procedures: NONE   Follow-Up: At [facility], you and your health needs are our priority.  As part of our continuing mission to provide you with exceptional heart care, we have created designated Provider Care Teams.  These Care Teams include your primary Cardiologist (physician) and Advanced Practice Providers (APPs -  Physician Assistants and Nurse Practitioners) who all work together to provide you with the care you need, when you need it.  We recommend signing up for the patient portal called "MyChart".  Sign up information is provided on this After Visit Summary.  MyChart is used to connect with patients for Virtual Visits (Telemedicine).  Patients are able to view lab/test results, encounter notes, upcoming appointments, etc.  Non-urgent messages can be sent to your provider as well.   To learn more about what you can do with MyChart, go to [email or web address].    Your next appointment:   1 year(s)  The format for your next appointment:   In Person  Provider:   Kardie Tobb, DO   

## 2022-06-20 DIAGNOSIS — S52572D Other intraarticular fracture of lower end of left radius, subsequent encounter for closed fracture with routine healing: Secondary | ICD-10-CM | POA: Diagnosis not present

## 2022-06-25 ENCOUNTER — Telehealth: Payer: Self-pay

## 2022-06-25 NOTE — Telephone Encounter (Signed)
Left message for patient to call our office.

## 2022-06-25 NOTE — Telephone Encounter (Signed)
Remmie called with bp concerns.  Her BP has been running higher over the last few weeks.  Yesterday it was 158/91 early am before her medication and 164/93 two hours later.  Today it is 147/90 early morning and now it is 161/88.  She is scheduled for physical therapy on her arm today and she is concerned that her bp will be higher with therapy.  She has been taking her losartan 100 mg and her metoprolol succinate 12.5 mg daily.

## 2022-06-26 ENCOUNTER — Other Ambulatory Visit: Payer: Self-pay

## 2022-06-26 DIAGNOSIS — M25532 Pain in left wrist: Secondary | ICD-10-CM | POA: Diagnosis not present

## 2022-06-26 DIAGNOSIS — M62542 Muscle wasting and atrophy, not elsewhere classified, left hand: Secondary | ICD-10-CM | POA: Diagnosis not present

## 2022-06-26 DIAGNOSIS — M25432 Effusion, left wrist: Secondary | ICD-10-CM | POA: Diagnosis not present

## 2022-06-26 DIAGNOSIS — M25632 Stiffness of left wrist, not elsewhere classified: Secondary | ICD-10-CM | POA: Diagnosis not present

## 2022-06-26 MED ORDER — VALSARTAN 320 MG PO TABS: 320.0000 mg | ORAL_TABLET | Freq: Every day | ORAL | 3 refills | Status: DC

## 2022-06-26 NOTE — Telephone Encounter (Signed)
Patient made aware, verbalized understanding. Valsartan sent to [facility] drug, appointment made with Jerrell Belfast for next Monday.

## 2022-06-29 DIAGNOSIS — M25532 Pain in left wrist: Secondary | ICD-10-CM | POA: Diagnosis not present

## 2022-06-29 DIAGNOSIS — M25432 Effusion, left wrist: Secondary | ICD-10-CM | POA: Diagnosis not present

## 2022-06-29 DIAGNOSIS — M62542 Muscle wasting and atrophy, not elsewhere classified, left hand: Secondary | ICD-10-CM | POA: Diagnosis not present

## 2022-06-29 DIAGNOSIS — M25632 Stiffness of left wrist, not elsewhere classified: Secondary | ICD-10-CM | POA: Diagnosis not present

## 2022-07-01 DIAGNOSIS — M62542 Muscle wasting and atrophy, not elsewhere classified, left hand: Secondary | ICD-10-CM | POA: Diagnosis not present

## 2022-07-01 DIAGNOSIS — M25432 Effusion, left wrist: Secondary | ICD-10-CM | POA: Diagnosis not present

## 2022-07-01 DIAGNOSIS — M25632 Stiffness of left wrist, not elsewhere classified: Secondary | ICD-10-CM | POA: Diagnosis not present

## 2022-07-01 DIAGNOSIS — M25532 Pain in left wrist: Secondary | ICD-10-CM | POA: Diagnosis not present

## 2022-07-02 ENCOUNTER — Ambulatory Visit (INDEPENDENT_AMBULATORY_CARE_PROVIDER_SITE_OTHER): Payer: PPO | Admitting: Nurse Practitioner

## 2022-07-02 ENCOUNTER — Encounter: Payer: Self-pay | Admitting: Nurse Practitioner

## 2022-07-02 VITALS — BP 140/78 | HR 56 | Temp 97.9°F | Resp 14 | Ht 60.5 in | Wt 116.0 lb

## 2022-07-02 DIAGNOSIS — I10 Essential (primary) hypertension: Secondary | ICD-10-CM | POA: Diagnosis not present

## 2022-07-02 MED ORDER — METOPROLOL SUCCINATE ER 25 MG PO TB24: 25.0000 mg | ORAL_TABLET | Freq: Every day | ORAL | 3 refills | Status: DC

## 2022-07-02 NOTE — Progress Notes (Signed)
Subjective:  Patient ID: Crystal Oneal, female    DOB: 1951/01/20  Age: 71 y.o. MRN: [id number]  Chief Complaint  Patient presents with   Hypertension    HPI  Patient had high blood pressure since the beginning of November. Her blood pressure was 188/92. Dr Tobie Poet changed her losartan 100 mg daily for Valsartan 320 mg daily. She checked her blood pressure and it went down to 144/82. She noticed "tightness on the head". She denies chest pain, SOB, dizziness.  Hypertension, follow-up  She was last seen for hypertension 4 weeks ago with cardiologist, Dr Harriet Masson.  BP at that visit was 144/90. Management includes Valsartan 320 mg and Metoprolol 25 mg QD.  She reports excellent compliance with treatment. She is not having side effects.  She is following a Low Sodium diet. She is exercising. She does not smoke.  Use of agents associated with hypertension: none.   Outside blood pressures are 140s/80s at home, forgot to bring BP log  Pertinent labs: Lab Results  Component Value Date   CHOL 182 05/11/2022   HDL 72 05/11/2022   LDLCALC 100 (H) 05/11/2022   TRIG 51 05/11/2022   CHOLHDL 2.5 05/11/2022   Lab Results  Component Value Date   NA 138 05/11/2022   K 4.0 05/11/2022   CREATININE 0.70 05/11/2022   EGFR 93 05/11/2022   GFRNONAA >60 05/23/2021   GLUCOSE 91 05/11/2022     The 10-year ASCVD risk score (Arnett DK, et al., 2019) is: 15.5%    BP    Current Outpatient Medications on File Prior to Visit  Medication Sig Dispense Refill   atorvastatin (LIPITOR) 10 MG tablet Take 1 tablet (10 mg total) by mouth 2 (two) times a week. 26 tablet 3   b complex vitamins tablet Take 1 tablet by mouth every evening.     calcium citrate-vitamin D (CITRACAL+D) 315-200 MG-UNIT tablet Take 2 tablets by mouth 2 (two) times daily.     cetirizine (ZYRTEC) 10 MG tablet Take 5-10 mg by mouth daily as needed for allergies (during spring months).     Cholecalciferol (VITAMIN D) 2000 units tablet  Take 2,000 Units by mouth in the morning.     denosumab (PROLIA) 60 MG/ML SOSY injection Inject 60 mg into the skin every 6 (six) months.     famotidine (PEPCID) 40 MG tablet Take 1 tablet (40 mg total) by mouth daily. 90 tablet 1   fluticasone (FLONASE) 50 MCG/ACT nasal spray Place 1 spray into both nostrils daily as needed for allergies or rhinitis.     Ginger, Zingiber officinalis, (GINGER ROOT) 550 MG CAPS Take 1 capsule by mouth in the morning and at bedtime.     metoprolol succinate (TOPROL-XL) 25 MG 24 hr tablet Take 0.5 tablets (12.5 mg total) by mouth daily. 45 tablet 3   Olopatadine HCl (PATADAY OP) Place 1 drop into both eyes daily as needed (allergy).     Omega-3 Fatty Acids (FISH OIL PO) Take 2,400 mg by mouth in the morning.     TURMERIC CURCUMIN PO Take 1,000 mg by mouth in the morning.     valsartan (DIOVAN) 320 MG tablet Take 1 tablet (320 mg total) by mouth daily. 30 tablet 3   vitamin B-12 (CYANOCOBALAMIN) 1000 MCG tablet Take 1,000 mcg by mouth in the morning.     vitamin C (ASCORBIC ACID) 250 MG tablet Take 250 mg by mouth 2 (two) times daily.     No current facility-administered medications on  file prior to visit.   Past Medical History:  Diagnosis Date   Age-related osteoporosis without current pathological fracture    Arthritis    Cancer ([place]) 04/2020   SCC skin cancer removed from nose   Cervical stenosis of spine    C11-12   Dysphagia    Esophageal dilation x2   Essential (primary) hypertension    GERD (gastroesophageal reflux disease)    Hypertension    Mixed hyperlipidemia    Paroxysmal SVT (supraventricular tachycardia)    PONV (postoperative nausea and vomiting)    Spondylolisthesis of lumbar region    Past Surgical History:  Procedure Laterality Date   ABDOMINAL HYSTERECTOMY     ANTERIOR AND POSTERIOR REPAIR N/A 03/20/2016   Procedure: REPAIR CYSTOCELE AND RECTOCELE;  Surgeon: Bjorn Loser, MD;  Location: [facility];  Service: Urology;  Laterality:  N/A;   BACK SURGERY  2019   has hardware   BREAST BIOPSY Left 02/23/2022   COLONOSCOPY     ESOPHAGEAL DILATION  2005   x 2   MOHS SURGERY  04/2020   SCC skin cancer removed from nose   OPEN REDUCTION INTERNAL FIXATION (ORIF) DISTAL RADIAL FRACTURE Left 05/11/2022   Procedure: OPEN REDUCTION INTERNAL FIXATION (ORIF) LEFT DISTAL RADIUS FRACTURE;  Surgeon: Leanora Cover, MD;  Location: [place];  Service: Orthopedics;  Laterality: Left;  90 MIN   VAGINAL HYSTERECTOMY Bilateral 03/20/2016   Procedure: TOTAL VAGINAL HYSTERECTOMY WITH BILATERAL SALPINGO OOPHORECTOMY mccall coloplasty;  Surgeon: Servando Salina, MD;  Location: [facility];  Service: Gynecology;  Laterality: Bilateral;   VAGINAL PROLAPSE REPAIR N/A 03/20/2016   Procedure: REPAIR VAULT PROLAPSE AND GRAFT;  Surgeon: Bjorn Loser, MD;  Location: [facility];  Service: Urology;  Laterality: N/A;    Family History  Problem Relation Age of Onset   Uterine cancer Mother    Breast cancer Mother    Lung disease Father    Breast cancer Sister    Breast cancer Maternal Aunt    Breast cancer Maternal Aunt    Breast cancer Maternal Aunt    Multiple myeloma Brother    Kidney cancer Brother    Social History   Socioeconomic History   Marital status: Married    Spouse name: [name]   Number of children: 2   Years of education: Not on file   Highest education level: Not on file  Occupational History   Occupation: Retired  Tobacco Use   Smoking status: Never   Smokeless tobacco: Never  Vaping Use   Vaping Use: Never used  Substance and Sexual Activity   Alcohol use: Not Currently    Alcohol/week: 1.0 standard drink of alcohol    Types: 1 Glasses of wine per week   Drug use: Never   Sexual activity: Yes    Birth control/protection: Post-menopausal, Surgical    Comment: hyst  Other Topics Concern   Not on file  Social History Narrative   Not on file   Social Determinants of Health   Financial Resource Strain:  Low Risk  (06/16/2020)   Overall Financial Resource Strain (CARDIA)    Difficulty of Paying Living Expenses: Not hard at all  Food Insecurity: No Food Insecurity (06/16/2020)   Hunger Vital Sign    Worried About Running Out of Food in the Last Year: Never true    Ran Out of Food in the Last Year: Never true  Transportation Needs: No Transportation Needs (06/16/2020)   PRAPARE - Transportation    Lack of Transportation (  Medical): No    Lack of Transportation (Non-Medical): No  Physical Activity: Sufficiently Active (06/16/2020)   Exercise Vital Sign    Days of Exercise per Week: 5 days    Minutes of Exercise per Session: 30 min  Stress: No Stress Concern Present (06/16/2020)   [employer] - Occupational Stress Questionnaire    Feeling of Stress : Not at all  Social Connections: Socially Integrated (06/16/2020)   Social Connection and Isolation Panel [NHANES]    Frequency of Communication with Friends and Family: More than three times a week    Frequency of Social Gatherings with Friends and Family: Twice a week    Attends Religious Services: More than 4 times per year    Active Member of Clubs or Organizations: Yes    Attends Club or Organization Meetings: More than 4 times per year    Marital Status: Married    Review of Systems  Constitutional:  Negative for chills, fatigue and fever.  HENT:  Negative for congestion, ear pain and sore throat.   Respiratory:  Negative for cough and shortness of breath.   Cardiovascular:  Negative for chest pain and palpitations.  Gastrointestinal:  Negative for abdominal pain, constipation, diarrhea, nausea and vomiting.  Endocrine: Negative for polydipsia, polyphagia and polyuria.  Genitourinary:  Negative for difficulty urinating and dysuria.  Musculoskeletal:  Negative for arthralgias, back pain and myalgias.  Skin:  Negative for rash.  Neurological:  Negative for dizziness and headaches.  Psychiatric/Behavioral:   Negative for dysphoric mood. The patient is not nervous/anxious.      Objective:  BP (!) 140/78   Pulse (!) 56   Temp 97.9 F (36.6 C)   Resp 14   Ht 5' 0.5" (1.537 m)   Wt 116 lb (52.6 kg)   SpO2 98%   BMI 22.28 kg/m      07/02/2022    9:15 AM 06/01/2022    9:19 AM 05/11/2022    3:47 PM  BP/Weight  Systolic BP 140 144 155  Diastolic BP 78 90 88  Wt. (Lbs) 116 115.8   BMI 22.28 kg/m2 22.24 kg/m2     Physical Exam Vitals reviewed.  Constitutional:      Appearance: Normal appearance.  HENT:     Right Ear: Tympanic membrane normal.     Left Ear: Tympanic membrane normal.     Nose: Nose normal.     Mouth/Throat:     Mouth: Mucous membranes are moist.  Eyes:     Pupils: Pupils are equal, round, and reactive to light.  Cardiovascular:     Rate and Rhythm: Regular rhythm. Bradycardia present.     Pulses: Normal pulses.     Heart sounds: Normal heart sounds.  Pulmonary:     Effort: Pulmonary effort is normal.     Breath sounds: Normal breath sounds.  Abdominal:     General: Bowel sounds are normal.     Palpations: Abdomen is soft.  Skin:    General: Skin is warm and dry.     Capillary Refill: Capillary refill takes less than 2 seconds.  Neurological:     General: No focal deficit present.     Mental Status: She is alert and oriented to person, place, and time.  Psychiatric:        Mood and Affect: Mood normal.        Behavior: Behavior normal.    Lab Results  Component Value Date   WBC 5.6 05/11/2022   HGB   12.6 05/11/2022   HCT 38.0 05/11/2022   PLT 232 05/11/2022   GLUCOSE 91 05/11/2022   CHOL 182 05/11/2022   TRIG 51 05/11/2022   HDL 72 05/11/2022   LDLCALC 100 (H) 05/11/2022   ALT 26 05/11/2022   AST 33 05/11/2022   NA 138 05/11/2022   K 4.0 05/11/2022   CL 104 05/11/2022   CREATININE 0.70 05/11/2022   BUN 12 05/11/2022   CO2 27 05/11/2022   TSH 4.900 (H) 11/06/2021   INR 1.0 05/11/2022      Assessment & Plan:   1. Essential (primary)  hypertension - metoprolol succinate (TOPROL-XL) 25 MG 24 hr tablet; Take 1 tablet (25 mg total) by mouth daily.  Dispense: 90 tablet; Refill: 3   Increase Metoprolol to 25 mg daily Continue Valsartan 320 mg daily Continue heart healthy diet and regular physical activity Monitor BP and pulse, keep log Return in 2 weeks for follow-up, bring BP,pulse log    Follow-up: 2-weeks  An After Visit Summary was printed and given to the patient.  I, Shannon J Heaton, NP, have reviewed all documentation for this visit. The documentation on 07/02/22 for the exam, diagnosis, procedures, and orders are all accurate and complete.    Signed, Shannon J Heaton, NP [facility] [phone] 

## 2022-07-02 NOTE — Patient Instructions (Signed)
Increase Metoprolol to 25 mg daily Continue Valsartan 320 mg daily Continue heart healthy diet and regular physical activity Monitor BP and pulse, keep log Return in 2 weeks for follow-up, bring BP,pulse log  Managing Your Hypertension Hypertension, also called high blood pressure, is when the force of the blood pressing against the walls of the arteries is too strong. Arteries are blood vessels that carry blood from your heart throughout your body. Hypertension forces the heart to work harder to pump blood and may cause the arteries to become narrow or stiff. Understanding blood pressure readings A blood pressure reading includes a higher number over a lower number: The first, or top, number is called the systolic pressure. It is a measure of the pressure in your arteries as your heart beats. The second, or bottom number, is called the diastolic pressure. It is a measure of the pressure in your arteries as the heart relaxes. For most people, a normal blood pressure is below 120/80. Your personal target blood pressure may vary depending on your medical conditions, your age, and other factors. Blood pressure is classified into four stages. Based on your blood pressure reading, your health care provider may use the following stages to determine what type of treatment you need, if any. Systolic pressure and diastolic pressure are measured in a unit called millimeters of mercury (mmHg). Normal Systolic pressure: below 154. Diastolic pressure: below 80. Elevated Systolic pressure: 008-676. Diastolic pressure: below 80. Hypertension stage 1 Systolic pressure: 195-093. Diastolic pressure: 26-71. Hypertension stage 2 Systolic pressure: 245 or above. Diastolic pressure: 90 or above. How can this condition affect me? Managing your hypertension is very important. Over time, hypertension can damage the arteries and decrease blood flow to parts of the body, including the brain, heart, and kidneys.  Having untreated or uncontrolled hypertension can lead to: A heart attack. A stroke. A weakened blood vessel (aneurysm). Heart failure. Kidney damage. Eye damage. Memory and concentration problems. Vascular dementia. What actions can I take to manage this condition? Hypertension can be managed by making lifestyle changes and possibly by taking medicines. Your health care provider will help you make a plan to bring your blood pressure within a normal range. You may be referred for counseling on a healthy diet and physical activity. Nutrition  Eat a diet that is high in fiber and potassium, and low in salt (sodium), added sugar, and fat. An example eating plan is called the DASH diet. DASH stands for Dietary Approaches to Stop Hypertension. To eat this way: Eat plenty of fresh fruits and vegetables. Try to fill one-half of your plate at each meal with fruits and vegetables. Eat whole grains, such as whole-wheat pasta, brown rice, or whole-grain bread. Fill about one-fourth of your plate with whole grains. Eat low-fat dairy products. Avoid fatty cuts of meat, processed or cured meats, and poultry with skin. Fill about one-fourth of your plate with lean proteins such as fish, chicken without skin, beans, eggs, and tofu. Avoid pre-made and processed foods. These tend to be higher in sodium, added sugar, and fat. Reduce your daily sodium intake. Many people with hypertension should eat less than 1,500 mg of sodium a day. Lifestyle  Work with your health care provider to maintain a healthy body weight or to lose weight. Ask what an ideal weight is for you. Get at least 30 minutes of exercise that causes your heart to beat faster (aerobic exercise) most days of the week. Activities may include walking, swimming, or biking. Include  exercise to strengthen your muscles (resistance exercise), such as weight lifting, as part of your weekly exercise routine. Try to do these types of exercises for 30  minutes at least 3 days a week. Do not use any products that contain nicotine or tobacco. These products include cigarettes, chewing tobacco, and vaping devices, such as e-cigarettes. If you need help quitting, ask your health care provider. Control any long-term (chronic) conditions you have, such as high cholesterol or diabetes. Identify your sources of stress and find ways to manage stress. This may include meditation, deep breathing, or making time for fun activities. Alcohol use Do not drink alcohol if: Your health care provider tells you not to drink. You are pregnant, may be pregnant, or are planning to become pregnant. If you drink alcohol: Limit how much you have to: 0-1 drink a day for women. 0-2 drinks a day for men. Know how much alcohol is in your drink. In the [place] one drink equals one 12 oz bottle of beer (355 mL), one 5 oz glass of wine (148 mL), or one 1 oz glass of hard liquor (44 mL). Medicines Your health care provider may prescribe medicine if lifestyle changes are not enough to get your blood pressure under control and if: Your systolic blood pressure is 130 or higher. Your diastolic blood pressure is 80 or higher. Take medicines only as told by your health care provider. Follow the directions carefully. Blood pressure medicines must be taken as told by your health care provider. The medicine does not work as well when you skip doses. Skipping doses also puts you at risk for problems. Monitoring Before you monitor your blood pressure: Do not smoke, drink caffeinated beverages, or exercise within 30 minutes before taking a measurement. Use the bathroom and empty your bladder (urinate). Sit quietly for at least 5 minutes before taking measurements. Monitor your blood pressure at home as told by your health care provider. To do this: Sit with your back straight and supported. Place your feet flat on the floor. Do not cross your legs. Support your arm on a flat surface,  such as a table. Make sure your upper arm is at heart level. Each time you measure, take two or three readings one minute apart and record the results. You may also need to have your blood pressure checked regularly by your health care provider. General information Talk with your health care provider about your diet, exercise habits, and other lifestyle factors that may be contributing to hypertension. Review all the medicines you take with your health care provider because there may be side effects or interactions. Keep all follow-up visits. Your health care provider can help you create and adjust your plan for managing your high blood pressure. Where to find more information National Heart, Lung, and Blood Institute: https://wilson-eaton.com/ American Heart Association: www.heart.org Contact a health care provider if: You think you are having a reaction to medicines you have taken. You have repeated (recurrent) headaches. You feel dizzy. You have swelling in your ankles. You have trouble with your vision. Get help right away if: You develop a severe headache or confusion. You have unusual weakness or numbness, or you feel faint. You have severe pain in your chest or abdomen. You vomit repeatedly. You have trouble breathing. These symptoms may be an emergency. Get help right away. Call 911. Do not wait to see if the symptoms will go away. Do not drive yourself to the hospital. Summary Hypertension is when the force of  blood pumping through your arteries is too strong. If this condition is not controlled, it may put you at risk for serious complications. Your personal target blood pressure may vary depending on your medical conditions, your age, and other factors. For most people, a normal blood pressure is less than 120/80. Hypertension is managed by lifestyle changes, medicines, or both. Lifestyle changes to help manage hypertension include losing weight, eating a healthy, low-sodium diet,  exercising more, stopping smoking, and limiting alcohol. This information is not intended to replace advice given to you by your health care provider. Make sure you discuss any questions you have with your health care provider. Document Revised: 04/13/2021 Document Reviewed: 04/13/2021 Elsevier Patient Education  [place].

## 2022-07-03 DIAGNOSIS — M25432 Effusion, left wrist: Secondary | ICD-10-CM | POA: Diagnosis not present

## 2022-07-03 DIAGNOSIS — M62542 Muscle wasting and atrophy, not elsewhere classified, left hand: Secondary | ICD-10-CM | POA: Diagnosis not present

## 2022-07-03 DIAGNOSIS — M25532 Pain in left wrist: Secondary | ICD-10-CM | POA: Diagnosis not present

## 2022-07-03 DIAGNOSIS — M25632 Stiffness of left wrist, not elsewhere classified: Secondary | ICD-10-CM | POA: Diagnosis not present

## 2022-07-10 DIAGNOSIS — M62542 Muscle wasting and atrophy, not elsewhere classified, left hand: Secondary | ICD-10-CM | POA: Diagnosis not present

## 2022-07-10 DIAGNOSIS — M25532 Pain in left wrist: Secondary | ICD-10-CM | POA: Diagnosis not present

## 2022-07-10 DIAGNOSIS — M25632 Stiffness of left wrist, not elsewhere classified: Secondary | ICD-10-CM | POA: Diagnosis not present

## 2022-07-10 DIAGNOSIS — M25432 Effusion, left wrist: Secondary | ICD-10-CM | POA: Diagnosis not present

## 2022-07-12 DIAGNOSIS — M25532 Pain in left wrist: Secondary | ICD-10-CM | POA: Diagnosis not present

## 2022-07-12 DIAGNOSIS — M62542 Muscle wasting and atrophy, not elsewhere classified, left hand: Secondary | ICD-10-CM | POA: Diagnosis not present

## 2022-07-12 DIAGNOSIS — M25432 Effusion, left wrist: Secondary | ICD-10-CM | POA: Diagnosis not present

## 2022-07-12 DIAGNOSIS — M25632 Stiffness of left wrist, not elsewhere classified: Secondary | ICD-10-CM | POA: Diagnosis not present

## 2022-07-17 DIAGNOSIS — M25432 Effusion, left wrist: Secondary | ICD-10-CM | POA: Diagnosis not present

## 2022-07-17 DIAGNOSIS — M62542 Muscle wasting and atrophy, not elsewhere classified, left hand: Secondary | ICD-10-CM | POA: Diagnosis not present

## 2022-07-17 DIAGNOSIS — M25532 Pain in left wrist: Secondary | ICD-10-CM | POA: Diagnosis not present

## 2022-07-17 DIAGNOSIS — M25632 Stiffness of left wrist, not elsewhere classified: Secondary | ICD-10-CM | POA: Diagnosis not present

## 2022-07-17 NOTE — Progress Notes (Unsigned)
Subjective:  Patient ID: Crystal Oneal, female    DOB: [birth year]  Age: 71 y.o. MRN: [id number]  Chief Complaint  Patient presents with   Hypertension    2 week follow up   Patient had high blood pressure since the beginning of November. Her blood pressure was 188/92. Dr Tobie Poet changed her losartan 100 mg daily for Valsartan 320 mg daily. She checked her blood pressure and it went down to 144/82. She noticed "tightness on the head". She denies chest pain, SOB, dizziness  Previous HPI^^^^  Increase Metoprolol to 25 mg daily Continue Valsartan 320 mg daily Continue heart healthy diet and regular physical activity Monitor BP and pulse, keep log Return in 2 weeks for follow-up, bring BP,pulse log Previous Plan^^^^   Hypertension Pertinent negatives include no chest pain, headaches, palpitations or shortness of breath.     She was last seen for hypertension 2 weeks ago.  BP at that visit was 140/78. Management includes Metoprolol 25 mg daily, Valsartan 320 mg daily.  She reports {excellent/good/fair/poor:19665} compliance with treatment. She {is/is not:9024} having side effects. {document side effects if present:1} She is following a {diet:21022986} diet. She {is/is not:9024} exercising. She {does/does not:200015} smoke.  Use of agents associated with hypertension: {bp agents assoc with hypertension:511::"none"}.   Outside blood pressures are {***enter patient reported home BP readings, or 'not being checked':1}.  Pertinent labs: Lab Results  Component Value Date   CHOL 182 05/11/2022   HDL 72 05/11/2022   LDLCALC 100 (H) 05/11/2022   TRIG 51 05/11/2022   CHOLHDL 2.5 05/11/2022   Lab Results  Component Value Date   NA 138 05/11/2022   K 4.0 05/11/2022   CREATININE 0.70 05/11/2022   EGFR 93 05/11/2022   GFRNONAA >60 05/23/2021   GLUCOSE 91 05/11/2022     The 10-year ASCVD risk score (Arnett DK, et al., 2019) is: 15.5%    Current Outpatient Medications on File Prior  to Visit  Medication Sig Dispense Refill   atorvastatin (LIPITOR) 10 MG tablet Take 1 tablet (10 mg total) by mouth 2 (two) times a week. 26 tablet 3   b complex vitamins tablet Take 1 tablet by mouth every evening.     calcium citrate-vitamin D (CITRACAL+D) 315-200 MG-UNIT tablet Take 2 tablets by mouth 2 (two) times daily.     cetirizine (ZYRTEC) 10 MG tablet Take 5-10 mg by mouth daily as needed for allergies (during spring months).     Cholecalciferol (VITAMIN D) 2000 units tablet Take 2,000 Units by mouth in the morning.     denosumab (PROLIA) 60 MG/ML SOSY injection Inject 60 mg into the skin every 6 (six) months.     famotidine (PEPCID) 40 MG tablet Take 1 tablet (40 mg total) by mouth daily. 90 tablet 1   fluticasone (FLONASE) 50 MCG/ACT nasal spray Place 1 spray into both nostrils daily as needed for allergies or rhinitis.     Ginger, Zingiber officinalis, (GINGER ROOT) 550 MG CAPS Take 1 capsule by mouth in the morning and at bedtime.     metoprolol succinate (TOPROL-XL) 25 MG 24 hr tablet Take 1 tablet (25 mg total) by mouth daily. 90 tablet 3   Olopatadine HCl (PATADAY OP) Place 1 drop into both eyes daily as needed (allergy).     Omega-3 Fatty Acids (FISH OIL PO) Take 2,400 mg by mouth in the morning.     TURMERIC CURCUMIN PO Take 1,000 mg by mouth in the morning.     valsartan (  DIOVAN) 320 MG tablet Take 1 tablet (320 mg total) by mouth daily. 30 tablet 3   vitamin B-12 (CYANOCOBALAMIN) 1000 MCG tablet Take 1,000 mcg by mouth in the morning.     vitamin C (ASCORBIC ACID) 250 MG tablet Take 250 mg by mouth 2 (two) times daily.     No current facility-administered medications on file prior to visit.   Past Medical History:  Diagnosis Date   Age-related osteoporosis without current pathological fracture    Arthritis    Cancer ([place]) 04/2020   SCC skin cancer removed from nose   Cervical stenosis of spine    C11-12   Dysphagia    Esophageal dilation x2   Essential (primary)  hypertension    GERD (gastroesophageal reflux disease)    Hypertension    Mixed hyperlipidemia    Paroxysmal SVT (supraventricular tachycardia)    PONV (postoperative nausea and vomiting)    Spondylolisthesis of lumbar region    Past Surgical History:  Procedure Laterality Date   ABDOMINAL HYSTERECTOMY     ANTERIOR AND POSTERIOR REPAIR N/A 03/20/2016   Procedure: REPAIR CYSTOCELE AND RECTOCELE;  Surgeon: Bjorn Loser, MD;  Location: [facility];  Service: Urology;  Laterality: N/A;   BACK SURGERY  2019   has hardware   BREAST BIOPSY Left 02/23/2022   COLONOSCOPY     ESOPHAGEAL DILATION  2005   x 2   MOHS SURGERY  04/2020   SCC skin cancer removed from nose   OPEN REDUCTION INTERNAL FIXATION (ORIF) DISTAL RADIAL FRACTURE Left 05/11/2022   Procedure: OPEN REDUCTION INTERNAL FIXATION (ORIF) LEFT DISTAL RADIUS FRACTURE;  Surgeon: Leanora Cover, MD;  Location: [facility];  Service: Orthopedics;  Laterality: Left;  90 MIN   VAGINAL HYSTERECTOMY Bilateral 03/20/2016   Procedure: TOTAL VAGINAL HYSTERECTOMY WITH BILATERAL SALPINGO OOPHORECTOMY mccall coloplasty;  Surgeon: Servando Salina, MD;  Location: [facility];  Service: Gynecology;  Laterality: Bilateral;   VAGINAL PROLAPSE REPAIR N/A 03/20/2016   Procedure: REPAIR VAULT PROLAPSE AND GRAFT;  Surgeon: Bjorn Loser, MD;  [facility];  Service: Urology;  Laterality: N/A;    Family History  Problem Relation Age of Onset   Uterine cancer Mother    Breast cancer Mother    Lung disease Father    Breast cancer Sister    Breast cancer Maternal Aunt    Breast cancer Maternal Aunt    Breast cancer Maternal Aunt    Multiple myeloma Brother    Kidney cancer Brother    Social History   Socioeconomic History   Marital status: Married    Spouse name: [name]   Number of children: 2   Years of education: Not on file   Highest education level: Not on file  Occupational History   Occupation: Retired  Tobacco Use    Smoking status: Never   Smokeless tobacco: Never  Vaping Use   Vaping Use: Never used  Substance and Sexual Activity   Alcohol use: Not Currently    Alcohol/week: 1.0 standard drink of alcohol    Types: 1 Glasses of wine per week   Drug use: Never   Sexual activity: Yes    Birth control/protection: Post-menopausal, Surgical    Comment: hyst  Other Topics Concern   Not on file  Social History Narrative   Not on file   Social Determinants of Health   Financial Resource Strain: Low Risk  (06/16/2020)   Overall Financial Resource Strain (CARDIA)    Difficulty of Paying Living Expenses: Not  hard at all  Food Insecurity: No Food Insecurity (06/16/2020)   Hunger Vital Sign    Worried About Running Out of Food in the Last Year: Never true    Ran Out of Food in the Last Year: Never true  Transportation Needs: No Transportation Needs (06/16/2020)   PRAPARE - Hydrologist (Medical): No    Lack of Transportation (Non-Medical): No  Physical Activity: Sufficiently Active (06/16/2020)   Exercise Vital Sign    Days of Exercise per Week: 5 days    Minutes of Exercise per Session: 30 min  Stress: No Stress Concern Present (06/16/2020)   [place]    Feeling of Stress : Not at all  Social Connections: [place] (06/16/2020)   Social Connection and Isolation Panel [NHANES]    Frequency of Communication with Friends and Family: More than three times a week    Frequency of Social Gatherings with Friends and Family: Twice a week    Attends Religious Services: More than 4 times per year    Active Member of [employer] or Organizations: Yes    Attends Music therapist: More than 4 times per year    Marital Status: Married    Review of Systems  Constitutional:  Negative for appetite change, fatigue and fever.  HENT:  Negative for congestion, ear pain, sinus pressure and sore throat.    Respiratory:  Negative for cough, chest tightness, shortness of breath and wheezing.   Cardiovascular:  Negative for chest pain and palpitations.  Gastrointestinal:  Negative for abdominal pain, constipation, diarrhea, nausea and vomiting.  Genitourinary:  Negative for dysuria and hematuria.  Musculoskeletal:  Negative for arthralgias, back pain, joint swelling and myalgias.  Skin:  Negative for rash.  Neurological:  Negative for dizziness, weakness and headaches.  Psychiatric/Behavioral:  Negative for dysphoric mood. The patient is not nervous/anxious.      Objective:  There were no vitals taken for this visit.     07/02/2022    9:15 AM 06/01/2022    9:19 AM 05/11/2022    3:47 PM  BP/Weight  Systolic BP 782 956 213  Diastolic BP 78 90 88  Wt. (Lbs) 116 115.8   BMI 22.28 kg/m2 22.24 kg/m2     Physical Exam Vitals reviewed.  Constitutional:      Appearance: Normal appearance. She is normal weight.  Cardiovascular:     Rate and Rhythm: Normal rate and regular rhythm.     Heart sounds: Normal heart sounds.  Pulmonary:     Effort: Pulmonary effort is normal.     Breath sounds: Normal breath sounds.  Abdominal:     General: Abdomen is flat. Bowel sounds are normal.     Palpations: Abdomen is soft.  Neurological:     Mental Status: She is alert and oriented to person, place, and time.  Psychiatric:        Mood and Affect: Mood normal.        Behavior: Behavior normal.     Diabetic Foot Exam - Simple   No data filed      Lab Results  Component Value Date   WBC 5.6 05/11/2022   HGB 12.6 05/11/2022   HCT 38.0 05/11/2022   PLT 232 05/11/2022   GLUCOSE 91 05/11/2022   CHOL 182 05/11/2022   TRIG 51 05/11/2022   HDL 72 05/11/2022   LDLCALC 100 (H) 05/11/2022   ALT 26 05/11/2022   AST  33 05/11/2022   NA 138 05/11/2022   K 4.0 05/11/2022   CL 104 05/11/2022   CREATININE 0.70 05/11/2022   BUN 12 05/11/2022   CO2 27 05/11/2022   TSH 4.900 (H) 11/06/2021   INR  1.0 05/11/2022      Assessment & Plan:   Problem List Items Addressed This Visit       Cardiovascular and Mediastinum   Essential (primary) hypertension - Primary  .  No orders of the defined types were placed in this encounter.   No orders of the defined types were placed in this encounter.    Follow-up: No follow-ups on file.  An After Visit Summary was printed and given to the patient.  Rip Harbour, NP [place] [phone]

## 2022-07-18 ENCOUNTER — Encounter: Payer: Self-pay | Admitting: Nurse Practitioner

## 2022-07-18 ENCOUNTER — Ambulatory Visit (INDEPENDENT_AMBULATORY_CARE_PROVIDER_SITE_OTHER): Payer: PPO | Admitting: Nurse Practitioner

## 2022-07-18 VITALS — BP 140/90 | HR 76 | Temp 97.8°F | Ht 60.0 in | Wt 115.0 lb

## 2022-07-18 DIAGNOSIS — Z23 Encounter for immunization: Secondary | ICD-10-CM | POA: Diagnosis not present

## 2022-07-18 DIAGNOSIS — I10 Essential (primary) hypertension: Secondary | ICD-10-CM | POA: Diagnosis not present

## 2022-07-18 MED ORDER — HYDROCHLOROTHIAZIDE 12.5 MG PO TABS: 12.5000 mg | ORAL_TABLET | Freq: Every day | ORAL | 3 refills | Status: DC

## 2022-07-18 NOTE — Patient Instructions (Addendum)
Added hydrocholorthiazide 12.5 mg, keep taking this medicine for now with other two until next follow up Follow up in 2 weeks   Hypertension, Adult Hypertension is another name for high blood pressure. High blood pressure forces your heart to work harder to pump blood. This can cause problems over time. There are two numbers in a blood pressure reading. There is a top number (systolic) over a bottom number (diastolic). It is best to have a blood pressure that is below 120/80. What are the causes? The cause of this condition is not known. Some other conditions can lead to high blood pressure. What increases the risk? Some lifestyle factors can make you more likely to develop high blood pressure: Smoking. Not getting enough exercise or physical activity. Being overweight. Having too much fat, sugar, calories, or salt (sodium) in your diet. Drinking too much alcohol. Other risk factors include: Having any of these conditions: Heart disease. Diabetes. High cholesterol. Kidney disease. Obstructive sleep apnea. Having a family history of high blood pressure and high cholesterol. Age. The risk increases with age. Stress. What are the signs or symptoms? High blood pressure may not cause symptoms. Very high blood pressure (hypertensive crisis) may cause: Headache. Fast or uneven heartbeats (palpitations). Shortness of breath. Nosebleed. Vomiting or feeling like you may vomit (nauseous). Changes in how you see. Very bad chest pain. Feeling dizzy. Seizures. How is this treated? This condition is treated by making healthy lifestyle changes, such as: Eating healthy foods. Exercising more. Drinking less alcohol. Your doctor may prescribe medicine if lifestyle changes do not help enough and if: Your top number is above 130. Your bottom number is above 80. Your personal target blood pressure may vary. Follow these instructions at home: Eating and drinking  If told, follow the DASH  eating plan. To follow this plan: Fill one half of your plate at each meal with fruits and vegetables. Fill one fourth of your plate at each meal with whole grains. Whole grains include whole-wheat pasta, brown rice, and whole-grain bread. Eat or drink low-fat dairy products, such as skim milk or low-fat yogurt. Fill one fourth of your plate at each meal with low-fat (lean) proteins. Low-fat proteins include fish, chicken without skin, eggs, beans, and tofu. Avoid fatty meat, cured and processed meat, or chicken with skin. Avoid pre-made or processed food. Limit the amount of salt in your diet to less than 1,500 mg each day. Do not drink alcohol if: Your doctor tells you not to drink. You are pregnant, may be pregnant, or are planning to become pregnant. If you drink alcohol: Limit how much you have to: 0-1 drink a day for women. 0-2 drinks a day for men. Know how much alcohol is in your drink. In the [place] one drink equals one 12 oz bottle of beer (355 mL), one 5 oz glass of wine (148 mL), or one 1 oz glass of hard liquor (44 mL). Lifestyle  Work with your doctor to stay at a healthy weight or to lose weight. Ask your doctor what the best weight is for you. Get at least 30 minutes of exercise that causes your heart to beat faster (aerobic exercise) most days of the week. This may include walking, swimming, or biking. Get at least 30 minutes of exercise that strengthens your muscles (resistance exercise) at least 3 days a week. This may include lifting weights or doing Pilates. Do not smoke or use any products that contain nicotine or tobacco. If you need help quitting,  ask your doctor. Check your blood pressure at home as told by your doctor. Keep all follow-up visits. Medicines Take over-the-counter and prescription medicines only as told by your doctor. Follow directions carefully. Do not skip doses of blood pressure medicine. The medicine does not work as well if you skip doses.  Skipping doses also puts you at risk for problems. Ask your doctor about side effects or reactions to medicines that you should watch for. Contact a doctor if: You think you are having a reaction to the medicine you are taking. You have headaches that keep coming back. You feel dizzy. You have swelling in your ankles. You have trouble with your vision. Get help right away if: You get a very bad headache. You start to feel mixed up (confused). You feel weak or numb. You feel faint. You have very bad pain in your: Chest. Belly (abdomen). You vomit more than once. You have trouble breathing. These symptoms may be an emergency. Get help right away. Call 911. Do not wait to see if the symptoms will go away. Do not drive yourself to the hospital. Summary Hypertension is another name for high blood pressure. High blood pressure forces your heart to work harder to pump blood. For most people, a normal blood pressure is less than 120/80. Making healthy choices can help lower blood pressure. If your blood pressure does not get lower with healthy choices, you may need to take medicine. This information is not intended to replace advice given to you by your health care provider. Make sure you discuss any questions you have with your health care provider. Document Revised: 05/18/2021 Document Reviewed: 05/18/2021 Elsevier Patient Education  [place].

## 2022-07-24 DIAGNOSIS — M25632 Stiffness of left wrist, not elsewhere classified: Secondary | ICD-10-CM | POA: Diagnosis not present

## 2022-07-24 DIAGNOSIS — M62542 Muscle wasting and atrophy, not elsewhere classified, left hand: Secondary | ICD-10-CM | POA: Diagnosis not present

## 2022-07-24 DIAGNOSIS — M25532 Pain in left wrist: Secondary | ICD-10-CM | POA: Diagnosis not present

## 2022-07-24 DIAGNOSIS — M25432 Effusion, left wrist: Secondary | ICD-10-CM | POA: Diagnosis not present

## 2022-07-26 DIAGNOSIS — M25532 Pain in left wrist: Secondary | ICD-10-CM | POA: Diagnosis not present

## 2022-07-26 DIAGNOSIS — M62542 Muscle wasting and atrophy, not elsewhere classified, left hand: Secondary | ICD-10-CM | POA: Diagnosis not present

## 2022-07-26 DIAGNOSIS — M25432 Effusion, left wrist: Secondary | ICD-10-CM | POA: Diagnosis not present

## 2022-07-26 DIAGNOSIS — M25632 Stiffness of left wrist, not elsewhere classified: Secondary | ICD-10-CM | POA: Diagnosis not present

## 2022-07-30 NOTE — Progress Notes (Unsigned)
Subjective:  Patient ID: Crystal Oneal, female    DOB: 1951/02/02  Age: 71 y.o. MRN: [id number]  No chief complaint on file.   HPI   She was last seen for hypertension 2 weeks ago.  BP at that visit was 140/90. Management includes Metoprolol, Valsartan and HCTZ.  She reports {excellent/good/fair/poor:19665} compliance with treatment. She {is/is not:9024} having side effects. {document side effects if present:1} She is following a {diet:21022986} diet. She {is/is not:9024} exercising. She {does/does not:200015} smoke.  Use of agents associated with hypertension: {bp agents assoc with hypertension:511::"none"}.   Outside blood pressures are {***enter patient reported home BP readings, or 'not being checked':1}.  Pertinent labs: Lab Results  Component Value Date   CHOL 182 05/11/2022   HDL 72 05/11/2022   LDLCALC 100 (H) 05/11/2022   TRIG 51 05/11/2022   CHOLHDL 2.5 05/11/2022   Lab Results  Component Value Date   NA 138 05/11/2022   K 4.0 05/11/2022   CREATININE 0.70 05/11/2022   EGFR 93 05/11/2022   GFRNONAA >60 05/23/2021   GLUCOSE 91 05/11/2022     The 10-year ASCVD risk score (Arnett DK, et al., 2019) is: 19.6%    Current Outpatient Medications on File Prior to Visit  Medication Sig Dispense Refill   atorvastatin (LIPITOR) 10 MG tablet Take 1 tablet (10 mg total) by mouth 2 (two) times a week. 26 tablet 3   b complex vitamins tablet Take 1 tablet by mouth every evening.     calcium citrate-vitamin D (CITRACAL+D) 315-200 MG-UNIT tablet Take 2 tablets by mouth 2 (two) times daily.     cetirizine (ZYRTEC) 10 MG tablet Take 5-10 mg by mouth daily as needed for allergies (during spring months).     Cholecalciferol (VITAMIN D) 2000 units tablet Take 2,000 Units by mouth in the morning.     denosumab (PROLIA) 60 MG/ML SOSY injection Inject 60 mg into the skin every 6 (six) months.     famotidine (PEPCID) 40 MG tablet Take 1 tablet (40 mg total) by mouth daily. 90  tablet 1   fluticasone (FLONASE) 50 MCG/ACT nasal spray Place 1 spray into both nostrils daily as needed for allergies or rhinitis.     Ginger, Zingiber officinalis, (GINGER ROOT) 550 MG CAPS Take 1 capsule by mouth in the morning and at bedtime.     hydrochlorothiazide (HYDRODIURIL) 12.5 MG tablet Take 1 tablet (12.5 mg total) by mouth daily. 90 tablet 3   metoprolol succinate (TOPROL-XL) 25 MG 24 hr tablet Take 1 tablet (25 mg total) by mouth daily. 90 tablet 3   Olopatadine HCl (PATADAY OP) Place 1 drop into both eyes daily as needed (allergy).     Omega-3 Fatty Acids (FISH OIL PO) Take 2,400 mg by mouth in the morning.     TURMERIC CURCUMIN PO Take 1,000 mg by mouth in the morning.     valsartan (DIOVAN) 320 MG tablet Take 1 tablet (320 mg total) by mouth daily. 30 tablet 3   vitamin B-12 (CYANOCOBALAMIN) 1000 MCG tablet Take 1,000 mcg by mouth in the morning.     vitamin C (ASCORBIC ACID) 250 MG tablet Take 250 mg by mouth 2 (two) times daily.     No current facility-administered medications on file prior to visit.   Past Medical History:  Diagnosis Date   Age-related osteoporosis without current pathological fracture    Arthritis    Cancer ([place]) 04/2020   SCC skin cancer removed from nose   Cervical stenosis  of spine    C11-12   Dysphagia    Esophageal dilation x2   Essential (primary) hypertension    GERD (gastroesophageal reflux disease)    Hypertension    Mixed hyperlipidemia    Paroxysmal SVT (supraventricular tachycardia)    PONV (postoperative nausea and vomiting)    Spondylolisthesis of lumbar region    Past Surgical History:  Procedure Laterality Date   ABDOMINAL HYSTERECTOMY     ANTERIOR AND POSTERIOR REPAIR N/A 03/20/2016   Procedure: REPAIR CYSTOCELE AND RECTOCELE;  Surgeon: Bjorn Loser, MD;  [facility];  Service: Urology;  Laterality: N/A;   BACK SURGERY  2019   has hardware   BREAST BIOPSY Left 02/23/2022   COLONOSCOPY     ESOPHAGEAL DILATION   2005   x 2   MOHS SURGERY  04/2020   SCC skin cancer removed from nose   OPEN REDUCTION INTERNAL FIXATION (ORIF) DISTAL RADIAL FRACTURE Left 05/11/2022   Procedure: OPEN REDUCTION INTERNAL FIXATION (ORIF) LEFT DISTAL RADIUS FRACTURE;  Surgeon: Leanora Cover, MD;  Location: [facility];  Service: Orthopedics;  Laterality: Left;  90 MIN   VAGINAL HYSTERECTOMY Bilateral 03/20/2016   Procedure: TOTAL VAGINAL HYSTERECTOMY WITH BILATERAL SALPINGO OOPHORECTOMY mccall coloplasty;  Surgeon: Servando Salina, MD;  [facility];  Service: Gynecology;  Laterality: Bilateral;   VAGINAL PROLAPSE REPAIR N/A 03/20/2016   Procedure: REPAIR VAULT PROLAPSE AND GRAFT;  Surgeon: Bjorn Loser, MD;  [facility];  Service: Urology;  Laterality: N/A;    Family History  Problem Relation Age of Onset   Uterine cancer Mother    Breast cancer Mother    Lung disease Father    Breast cancer Sister    Breast cancer Maternal Aunt    Breast cancer Maternal Aunt    Breast cancer Maternal Aunt    Multiple myeloma Brother    Kidney cancer Brother    Social History   Socioeconomic History   Marital status: Married    Spouse name: [name]   Number of children: 2   Years of education: Not on file   Highest education level: Not on file  Occupational History   Occupation: Retired  Tobacco Use   Smoking status: Never   Smokeless tobacco: Never  Vaping Use   Vaping Use: Never used  Substance and Sexual Activity   Alcohol use: Not Currently    Alcohol/week: 1.0 standard drink of alcohol    Types: 1 Glasses of wine per week   Drug use: Never   Sexual activity: Yes    Birth control/protection: Post-menopausal, Surgical    Comment: hyst  Other Topics Concern   Not on file  Social History Narrative   Not on file   Social Determinants of Health   Financial Resource Strain: Low Risk  (06/16/2020)   Overall Financial Resource Strain (CARDIA)    Difficulty of Paying Living Expenses: Not  hard at all  Food Insecurity: No Food Insecurity (06/16/2020)   Hunger Vital Sign    Worried About Running Out of Food in the Last Year: Never true    [place] in the Last Year: Never true  Transportation Needs: No Transportation Needs (06/16/2020)   PRAPARE - Hydrologist (Medical): No    Lack of Transportation (Non-Medical): No  Physical Activity: Sufficiently Active (06/16/2020)   Exercise Vital Sign    Days of Exercise per Week: 5 days    Minutes of Exercise per Session: 30 min  Stress: No Stress Concern Present (06/16/2020)   [place]    Feeling of Stress : Not at all  Social Connections: [place] (06/16/2020)   Social Connection and Isolation Panel [NHANES]    Frequency of Communication with Friends and Family: More than three times a week    Frequency of Social Gatherings with Friends and Family: Twice a week    Attends Religious Services: More than 4 times per year    Active Member of [employer] or Organizations: Yes    Attends Music therapist: More than 4 times per year    Marital Status: Married    Review of Systems   Objective:  There were no vitals taken for this visit.     07/18/2022   10:14 AM 07/18/2022   10:00 AM 07/02/2022    9:15 AM  BP/Weight  Systolic BP 659 935 701  Diastolic BP 90 88 78  Wt. (Lbs)  115 116  BMI  22.46 kg/m2 22.28 kg/m2    Physical Exam  Diabetic Foot Exam - Simple   No data filed      Lab Results  Component Value Date   WBC 5.6 05/11/2022   HGB 12.6 05/11/2022   HCT 38.0 05/11/2022   PLT 232 05/11/2022   GLUCOSE 91 05/11/2022   CHOL 182 05/11/2022   TRIG 51 05/11/2022   HDL 72 05/11/2022   LDLCALC 100 (H) 05/11/2022   ALT 26 05/11/2022   AST 33 05/11/2022   NA 138 05/11/2022   K 4.0 05/11/2022   CL 104 05/11/2022   CREATININE 0.70 05/11/2022   BUN 12 05/11/2022   CO2 27 05/11/2022   TSH 4.900  (H) 11/06/2021   INR 1.0 05/11/2022      Assessment & Plan:   Problem List Items Addressed This Visit   None .  No orders of the defined types were placed in this encounter.   No orders of the defined types were placed in this encounter.    Follow-up: No follow-ups on file.  An After Visit Summary was printed and given to the patient.  Rip Harbour, NP [place] [phone]

## 2022-07-31 ENCOUNTER — Encounter: Payer: Self-pay | Admitting: Nurse Practitioner

## 2022-07-31 ENCOUNTER — Ambulatory Visit (INDEPENDENT_AMBULATORY_CARE_PROVIDER_SITE_OTHER): Payer: PPO | Admitting: Nurse Practitioner

## 2022-07-31 VITALS — BP 136/84 | HR 73 | Temp 96.9°F | Ht 60.0 in | Wt 116.0 lb

## 2022-07-31 DIAGNOSIS — M25632 Stiffness of left wrist, not elsewhere classified: Secondary | ICD-10-CM | POA: Diagnosis not present

## 2022-07-31 DIAGNOSIS — I10 Essential (primary) hypertension: Secondary | ICD-10-CM | POA: Diagnosis not present

## 2022-07-31 DIAGNOSIS — M25432 Effusion, left wrist: Secondary | ICD-10-CM | POA: Diagnosis not present

## 2022-07-31 DIAGNOSIS — M62542 Muscle wasting and atrophy, not elsewhere classified, left hand: Secondary | ICD-10-CM | POA: Diagnosis not present

## 2022-07-31 DIAGNOSIS — M25532 Pain in left wrist: Secondary | ICD-10-CM | POA: Diagnosis not present

## 2022-07-31 NOTE — Patient Instructions (Addendum)
Continue medications as prescribed  Follow-up with Dr Tobie Poet 11/12/22 at 8:40 as scheduled   Managing Your Hypertension Hypertension, also called high blood pressure, is when the force of the blood pressing against the walls of the arteries is too strong. Arteries are blood vessels that carry blood from your heart throughout your body. Hypertension forces the heart to work harder to pump blood and may cause the arteries to become narrow or stiff. Understanding blood pressure readings A blood pressure reading includes a higher number over a lower number: The first, or top, number is called the systolic pressure. It is a measure of the pressure in your arteries as your heart beats. The second, or bottom number, is called the diastolic pressure. It is a measure of the pressure in your arteries as the heart relaxes. For most people, a normal blood pressure is below 120/80. Your personal target blood pressure may vary depending on your medical conditions, your age, and other factors. Blood pressure is classified into four stages. Based on your blood pressure reading, your health care provider may use the following stages to determine what type of treatment you need, if any. Systolic pressure and diastolic pressure are measured in a unit called millimeters of mercury (mmHg). Normal Systolic pressure: below 235. Diastolic pressure: below 80. Elevated Systolic pressure: 361-443. Diastolic pressure: below 80. Hypertension stage 1 Systolic pressure: 154-008. Diastolic pressure: 67-61. Hypertension stage 2 Systolic pressure: 950 or above. Diastolic pressure: 90 or above. How can this condition affect me? Managing your hypertension is very important. Over time, hypertension can damage the arteries and decrease blood flow to parts of the body, including the brain, heart, and kidneys. Having untreated or uncontrolled hypertension can lead to: A heart attack. A stroke. A weakened blood vessel  (aneurysm). Heart failure. Kidney damage. Eye damage. Memory and concentration problems. Vascular dementia. What actions can I take to manage this condition? Hypertension can be managed by making lifestyle changes and possibly by taking medicines. Your health care provider will help you make a plan to bring your blood pressure within a normal range. You may be referred for counseling on a healthy diet and physical activity. Nutrition  Eat a diet that is high in fiber and potassium, and low in salt (sodium), added sugar, and fat. An example eating plan is called the DASH diet. DASH stands for Dietary Approaches to Stop Hypertension. To eat this way: Eat plenty of fresh fruits and vegetables. Try to fill one-half of your plate at each meal with fruits and vegetables. Eat whole grains, such as whole-wheat pasta, brown rice, or whole-grain bread. Fill about one-fourth of your plate with whole grains. Eat low-fat dairy products. Avoid fatty cuts of meat, processed or cured meats, and poultry with skin. Fill about one-fourth of your plate with lean proteins such as fish, chicken without skin, beans, eggs, and tofu. Avoid pre-made and processed foods. These tend to be higher in sodium, added sugar, and fat. Reduce your daily sodium intake. Many people with hypertension should eat less than 1,500 mg of sodium a day. Lifestyle  Work with your health care provider to maintain a healthy body weight or to lose weight. Ask what an ideal weight is for you. Get at least 30 minutes of exercise that causes your heart to beat faster (aerobic exercise) most days of the week. Activities may include walking, swimming, or biking. Include exercise to strengthen your muscles (resistance exercise), such as weight lifting, as part of your weekly exercise routine. Try  to do these types of exercises for 30 minutes at least 3 days a week. Do not use any products that contain nicotine or tobacco. These products include  cigarettes, chewing tobacco, and vaping devices, such as e-cigarettes. If you need help quitting, ask your health care provider. Control any long-term (chronic) conditions you have, such as high cholesterol or diabetes. Identify your sources of stress and find ways to manage stress. This may include meditation, deep breathing, or making time for fun activities. Alcohol use Do not drink alcohol if: Your health care provider tells you not to drink. You are pregnant, may be pregnant, or are planning to become pregnant. If you drink alcohol: Limit how much you have to: 0-1 drink a day for women. 0-2 drinks a day for men. Know how much alcohol is in your drink. In the [place] one drink equals one 12 oz bottle of beer (355 mL), one 5 oz glass of wine (148 mL), or one 1 oz glass of hard liquor (44 mL). Medicines Your health care provider may prescribe medicine if lifestyle changes are not enough to get your blood pressure under control and if: Your systolic blood pressure is 130 or higher. Your diastolic blood pressure is 80 or higher. Take medicines only as told by your health care provider. Follow the directions carefully. Blood pressure medicines must be taken as told by your health care provider. The medicine does not work as well when you skip doses. Skipping doses also puts you at risk for problems. Monitoring Before you monitor your blood pressure: Do not smoke, drink caffeinated beverages, or exercise within 30 minutes before taking a measurement. Use the bathroom and empty your bladder (urinate). Sit quietly for at least 5 minutes before taking measurements. Monitor your blood pressure at home as told by your health care provider. To do this: Sit with your back straight and supported. Place your feet flat on the floor. Do not cross your legs. Support your arm on a flat surface, such as a table. Make sure your upper arm is at heart level. Each time you measure, take two or three readings  one minute apart and record the results. You may also need to have your blood pressure checked regularly by your health care provider. General information Talk with your health care provider about your diet, exercise habits, and other lifestyle factors that may be contributing to hypertension. Review all the medicines you take with your health care provider because there may be side effects or interactions. Keep all follow-up visits. Your health care provider can help you create and adjust your plan for managing your high blood pressure. Where to find more information National Heart, Lung, and Blood Institute: https://wilson-eaton.com/ American Heart Association: www.heart.org Contact a health care provider if: You think you are having a reaction to medicines you have taken. You have repeated (recurrent) headaches. You feel dizzy. You have swelling in your ankles. You have trouble with your vision. Get help right away if: You develop a severe headache or confusion. You have unusual weakness or numbness, or you feel faint. You have severe pain in your chest or abdomen. You vomit repeatedly. You have trouble breathing. These symptoms may be an emergency. Get help right away. Call 911. Do not wait to see if the symptoms will go away. Do not drive yourself to the hospital. Summary Hypertension is when the force of blood pumping through your arteries is too strong. If this condition is not controlled, it may put you at  risk for serious complications. Your personal target blood pressure may vary depending on your medical conditions, your age, and other factors. For most people, a normal blood pressure is less than 120/80. Hypertension is managed by lifestyle changes, medicines, or both. Lifestyle changes to help manage hypertension include losing weight, eating a healthy, low-sodium diet, exercising more, stopping smoking, and limiting alcohol. This information is not intended to replace advice given  to you by your health care provider. Make sure you discuss any questions you have with your health care provider. Document Revised: 04/13/2021 Document Reviewed: 04/13/2021 Elsevier Patient Education  [place].

## 2022-08-01 LAB — COMPREHENSIVE METABOLIC PANEL
ALT: 25 IU/L (ref 0–32)
AST: 35 IU/L (ref 0–40)
Albumin/Globulin Ratio: 1.5 (ref 1.2–2.2)
Albumin: 4.3 g/dL (ref 3.8–4.8)
Alkaline Phosphatase: 41 IU/L — ABNORMAL LOW (ref 44–121)
BUN/Creatinine Ratio: 19 (ref 12–28)
BUN: 15 mg/dL (ref 8–27)
Bilirubin Total: 0.3 mg/dL (ref 0.0–1.2)
CO2: 24 mmol/L (ref 20–29)
Calcium: 10.4 mg/dL — ABNORMAL HIGH (ref 8.7–10.3)
Chloride: 102 mmol/L (ref 96–106)
Creatinine, Ser: 0.78 mg/dL (ref 0.57–1.00)
Globulin, Total: 2.8 g/dL (ref 1.5–4.5)
Glucose: 90 mg/dL (ref 70–99)
Potassium: 4 mmol/L (ref 3.5–5.2)
Sodium: 139 mmol/L (ref 134–144)
Total Protein: 7.1 g/dL (ref 6.0–8.5)
eGFR: 81 mL/min/{1.73_m2} (ref >59)

## 2022-08-02 DIAGNOSIS — M25532 Pain in left wrist: Secondary | ICD-10-CM | POA: Diagnosis not present

## 2022-08-02 DIAGNOSIS — M62542 Muscle wasting and atrophy, not elsewhere classified, left hand: Secondary | ICD-10-CM | POA: Diagnosis not present

## 2022-08-02 DIAGNOSIS — M25432 Effusion, left wrist: Secondary | ICD-10-CM | POA: Diagnosis not present

## 2022-08-02 DIAGNOSIS — M25632 Stiffness of left wrist, not elsewhere classified: Secondary | ICD-10-CM | POA: Diagnosis not present

## 2022-08-08 DIAGNOSIS — M25432 Effusion, left wrist: Secondary | ICD-10-CM | POA: Diagnosis not present

## 2022-08-08 DIAGNOSIS — M25632 Stiffness of left wrist, not elsewhere classified: Secondary | ICD-10-CM | POA: Diagnosis not present

## 2022-08-08 DIAGNOSIS — M62542 Muscle wasting and atrophy, not elsewhere classified, left hand: Secondary | ICD-10-CM | POA: Diagnosis not present

## 2022-08-08 DIAGNOSIS — M25532 Pain in left wrist: Secondary | ICD-10-CM | POA: Diagnosis not present

## 2022-08-09 DIAGNOSIS — M25432 Effusion, left wrist: Secondary | ICD-10-CM | POA: Diagnosis not present

## 2022-08-09 DIAGNOSIS — M25532 Pain in left wrist: Secondary | ICD-10-CM | POA: Diagnosis not present

## 2022-08-09 DIAGNOSIS — M25632 Stiffness of left wrist, not elsewhere classified: Secondary | ICD-10-CM | POA: Diagnosis not present

## 2022-08-09 DIAGNOSIS — M62542 Muscle wasting and atrophy, not elsewhere classified, left hand: Secondary | ICD-10-CM | POA: Diagnosis not present

## 2022-08-14 DIAGNOSIS — M25632 Stiffness of left wrist, not elsewhere classified: Secondary | ICD-10-CM | POA: Diagnosis not present

## 2022-08-14 DIAGNOSIS — M62542 Muscle wasting and atrophy, not elsewhere classified, left hand: Secondary | ICD-10-CM | POA: Diagnosis not present

## 2022-08-14 DIAGNOSIS — M25432 Effusion, left wrist: Secondary | ICD-10-CM | POA: Diagnosis not present

## 2022-08-14 DIAGNOSIS — M25532 Pain in left wrist: Secondary | ICD-10-CM | POA: Diagnosis not present

## 2022-08-16 ENCOUNTER — Ambulatory Visit (INDEPENDENT_AMBULATORY_CARE_PROVIDER_SITE_OTHER): Payer: PPO

## 2022-08-16 VITALS — BP 103/67 | HR 67 | Resp 16 | Ht 60.0 in | Wt 116.0 lb

## 2022-08-16 DIAGNOSIS — M62542 Muscle wasting and atrophy, not elsewhere classified, left hand: Secondary | ICD-10-CM | POA: Diagnosis not present

## 2022-08-16 DIAGNOSIS — M25532 Pain in left wrist: Secondary | ICD-10-CM | POA: Diagnosis not present

## 2022-08-16 DIAGNOSIS — Z0000 Encounter for general adult medical examination without abnormal findings: Secondary | ICD-10-CM | POA: Diagnosis not present

## 2022-08-16 DIAGNOSIS — M25432 Effusion, left wrist: Secondary | ICD-10-CM | POA: Diagnosis not present

## 2022-08-16 DIAGNOSIS — M25632 Stiffness of left wrist, not elsewhere classified: Secondary | ICD-10-CM | POA: Diagnosis not present

## 2022-08-16 NOTE — Patient Instructions (Signed)
Crystal Oneal , Thank you for taking time to come for your Medicare Wellness Visit. I appreciate your ongoing commitment to your health goals. Please review the following plan we discussed and let me know if I can assist you in the future.   Screening recommendations/referrals: Colonoscopy: Due 2028 Mammogram: Due 01/2023 Bone Density: Due 04/2024 Recommended yearly ophthalmology/optometry visit for glaucoma screening and checkup Recommended yearly dental visit for hygiene and checkup  Vaccinations: Influenza vaccine: up-to-date - due yearly in the fall Pneumococcal vaccine: Adverse Reaction Tdap vaccine: due 2026 Shingles vaccine: Due - can get at the pharmacy    Advanced directives: Please bring a copy for your medical record  Preventive Care 65 Years and Older, Female   Preventive care refers to lifestyle choices and visits with your health care provider that can promote health and wellness.  What does preventive care include? A yearly physical exam. This is also called an annual well check. Dental exams once or twice a year. Routine eye exams. Ask your health care provider how often you should have your eyes checked. Personal lifestyle choices, including: Daily care of your teeth and gums. Regular physical activity. Eating a healthy diet. Avoiding tobacco and drug use. Limiting alcohol use. Practicing safe sex. Taking low-dose aspirin every day. Taking vitamin and mineral supplements as recommended by your health care provider.  What happens during an annual well check? The services and screenings done by your health care provider during your annual well check will depend on your age, overall health, lifestyle risk factors, and family history of disease.  Counseling Your health care provider may ask you questions about your: Alcohol use. Tobacco use. Drug use. Emotional well-being. Home and relationship well-being. Sexual activity. Eating habits. History of  falls. Memory and ability to understand (cognition). Work and work Statistician. Reproductive health.  Screening You may have the following tests or measurements: Height, weight, and BMI. Blood pressure. Lipid and cholesterol levels. These may be checked every 5 years, or more frequently if you are over 78 years old. Skin check. Lung cancer screening. You may have this screening every year starting at age 72 if you have a 30-pack-year history of smoking and currently smoke or have quit within the past 15 years. Fecal occult blood test (FOBT) of the stool. You may have this test every year starting at age 72. Flexible sigmoidoscopy or colonoscopy. You may have a sigmoidoscopy every 5 years or a colonoscopy every 10 years starting at age 72. Hepatitis C blood test. Hepatitis B blood test. Sexually transmitted disease (STD) testing. Diabetes screening. This is done by checking your blood sugar (glucose) after you have not eaten for a while (fasting). You may have this done every 1-3 years. Bone density scan. This is done to screen for osteoporosis. You may have this done starting at age 72. Mammogram. This may be done every 1-2 years. Talk to your health care provider about how often you should have regular mammograms. Talk with your health care provider about your test results, treatment options, and if necessary, the need for more tests.  Vaccines Your health care provider may recommend certain vaccines, such as: Influenza vaccine. This is recommended every year. Tetanus, diphtheria, and acellular pertussis (Tdap, Td) vaccine. You may need a Td booster every 10 years. Zoster vaccine. You may need this after age 53. Pneumococcal 13-valent conjugate (PCV13) vaccine. One dose is recommended after age 72. Pneumococcal polysaccharide (PPSV23) vaccine. One dose is recommended after age 72. Talk to your health  care provider about which screenings and vaccines you need and how often you need  them.  This information is not intended to replace advice given to you by your health care provider. Make sure you discuss any questions you have with your health care provider. Document Released: 08/26/2015 Document Revised: 04/18/2016 Document Reviewed: 05/31/2015 Elsevier Interactive Patient Education  2017 [place] Prevention in the Home  Falls can cause injuries. They can happen to people of all ages. There are many things you can do to make your home safe and to help prevent falls.  What can I do on the outside of my home? Regularly fix the edges of walkways and driveways and fix any cracks. Remove anything that might make you trip as you walk through a door, such as a raised step or threshold. Trim any bushes or trees on the path to your home. Use bright outdoor lighting. Clear any walking paths of anything that might make someone trip, such as rocks or tools. Regularly check to see if handrails are loose or broken. Make sure that both sides of any steps have handrails. Any raised decks and porches should have guardrails on the edges. Have any leaves, snow, or ice cleared regularly. Use sand or salt on walking paths during winter. Clean up any spills in your garage right away. This includes oil or grease spills.  What can I do in the bathroom? Use night lights. Install grab bars by the toilet and in the tub and shower. Do not use towel bars as grab bars. Use non-skid mats or decals in the tub or shower. If you need to sit down in the shower, use a plastic, non-slip stool. Keep the floor dry. Clean up any water that spills on the floor as soon as it happens. Remove soap buildup in the tub or shower regularly. Attach bath mats securely with double-sided non-slip rug tape. Do not have throw rugs and other things on the floor that can make you trip.  What can I do in the bedroom? Use night lights. Make sure that you have a light by your bed that is easy to  reach. Do not use any sheets or blankets that are too big for your bed. They should not hang down onto the floor. Have a firm chair that has side arms. You can use this for support while you get dressed. Do not have throw rugs and other things on the floor that can make you trip.  What can I do in the kitchen? Clean up any spills right away. Avoid walking on wet floors. Keep items that you use a lot in easy-to-reach places. If you need to reach something above you, use a strong step stool that has a grab bar. Keep electrical cords out of the way. Do not use floor polish or wax that makes floors slippery. If you must use wax, use non-skid floor wax. Do not have throw rugs and other things on the floor that can make you trip.  What can I do with my stairs? Do not leave any items on the stairs. Make sure that there are handrails on both sides of the stairs and use them. Fix handrails that are broken or loose. Make sure that handrails are as long as the stairways. Check any carpeting to make sure that it is firmly attached to the stairs. Fix any carpet that is loose or worn. Avoid having throw rugs at the top or bottom of the stairs. If  you do have throw rugs, attach them to the floor with carpet tape. Make sure that you have a light switch at the top of the stairs and the bottom of the stairs. If you do not have them, ask someone to add them for you.  What else can I do to help prevent falls? Wear shoes that: Do not have high heels. Have rubber bottoms. Are comfortable and fit you well. Are closed at the toe. Do not wear sandals. If you use a stepladder: Make sure that it is fully opened. Do not climb a closed stepladder. Make sure that both sides of the stepladder are locked into place. Ask someone to hold it for you, if possible. Clearly mark and make sure that you can see: Any grab bars or handrails. First and last steps. Where the edge of each step is. Use tools that help you move  around (mobility aids) if they are needed. These include: Canes. Walkers. Scooters. Crutches. Turn on the lights when you go into a dark area. Replace any light bulbs as soon as they burn out. Set up your furniture so you have a clear path. Avoid moving your furniture around. If any of your floors are uneven, fix them. If there are any pets around you, be aware of where they are. Review your medicines with your doctor. Some medicines can make you feel dizzy. This can increase your chance of falling. Ask your doctor what other things that you can do to help prevent falls.  This information is not intended to replace advice given to you by your health care provider. Make sure you discuss any questions you have with your health care provider. Document Released: 05/26/2009 Document Revised: 01/05/2016 Document Reviewed: 09/03/2014 Elsevier Interactive Patient Education  2017 [employer].

## 2022-08-16 NOTE — Progress Notes (Signed)
Subjective:   Crystal Oneal is a 72 y.o. female who presents for Medicare Annual (Subsequent) preventive examination.  This wellness visit is conducted by a nurse.  The patient's medications were reviewed and reconciled since the patient's last visit.  History details were provided by the patient.  The history appears to be reliable.    Medical History: Patient history and Family history was reviewed  Medications, Allergies, and preventative health maintenance was reviewed and updated.  Cardiac Risk Factors include: advanced age (>62mn, >>29women)     Objective:    Today's Vitals   08/16/22 1028  BP: 103/67  Pulse: 67  Resp: 16  SpO2: 97%  Weight: 116 lb (52.6 kg)  Height: 5' (1.524 m)   Body mass index is 22.65 kg/m.     08/16/2022   10:36 AM 05/11/2022   12:03 PM 05/08/2022   10:44 AM 05/03/2022    7:59 PM 05/18/2021   11:18 AM 05/15/2021    2:13 PM 09/21/2020    5:35 PM  Advanced Directives  Does Patient Have a Medical Advance Directive? Yes No No No Yes Yes No  Type of AProduction managerof AHenningLiving will HMcKinneyLiving will   Does patient want to make changes to medical advance directive? No - Patient declined    No - Patient declined No - Patient declined   Copy of [place] Chart?     No - copy requested No - copy requested   Would patient like information on creating a medical advance directive?  No - Patient declined     No - Patient declined    Current Medications (verified) Outpatient Encounter Medications as of 08/16/2022  Medication Sig   atorvastatin (LIPITOR) 10 MG tablet Take 1 tablet (10 mg total) by mouth 2 (two) times a week.   b complex vitamins tablet Take 1 tablet by mouth every evening.   calcium citrate-vitamin D (CITRACAL+D) 315-200 MG-UNIT tablet Take 2 tablets by mouth 2 (two) times daily.   cetirizine (ZYRTEC) 10 MG tablet Take 5-10 mg by mouth daily as needed for allergies  (during spring months).   Cholecalciferol (VITAMIN D) 2000 units tablet Take 2,000 Units by mouth in the morning.   denosumab (PROLIA) 60 MG/ML SOSY injection Inject 60 mg into the skin every 6 (six) months.   famotidine (PEPCID) 40 MG tablet Take 1 tablet (40 mg total) by mouth daily.   fluticasone (FLONASE) 50 MCG/ACT nasal spray Place 1 spray into both nostrils daily as needed for allergies or rhinitis.   Ginger, Zingiber officinalis, (GINGER ROOT) 550 MG CAPS Take 1 capsule by mouth in the morning and at bedtime.   hydrochlorothiazide (HYDRODIURIL) 12.5 MG tablet Take 1 tablet (12.5 mg total) by mouth daily.   metoprolol succinate (TOPROL-XL) 25 MG 24 hr tablet Take 1 tablet (25 mg total) by mouth daily.   Olopatadine HCl (PATADAY OP) Place 1 drop into both eyes daily as needed (allergy).   Omega-3 Fatty Acids (FISH OIL PO) Take 2,400 mg by mouth in the morning.   TURMERIC CURCUMIN PO Take 1,000 mg by mouth in the morning.   valsartan (DIOVAN) 320 MG tablet Take 1 tablet (320 mg total) by mouth daily.   vitamin B-12 (CYANOCOBALAMIN) 1000 MCG tablet Take 1,000 mcg by mouth in the morning.   vitamin C (ASCORBIC ACID) 250 MG tablet Take 250 mg by mouth 2 (two) times daily.   No  facility-administered encounter medications on file as of 08/16/2022.    Allergies (verified) Pneumococcal vaccine, Pneumovax 23 [pneumococcal vac polyvalent], Betadine [povidone-iodine], Lisinopril, and Sulfa antibiotics   History: Past Medical History:  Diagnosis Date   Age-related osteoporosis without current pathological fracture    Arthritis    Cancer ([place]) 04/2020   SCC skin cancer removed from nose   Cervical stenosis of spine    C11-12   Dysphagia    Esophageal dilation x2   Essential (primary) hypertension    GERD (gastroesophageal reflux disease)    Hypertension    Mixed hyperlipidemia    Paroxysmal SVT (supraventricular tachycardia)    PONV (postoperative nausea and vomiting)     Spondylolisthesis of lumbar region    Past Surgical History:  Procedure Laterality Date   ABDOMINAL HYSTERECTOMY     ANTERIOR AND POSTERIOR REPAIR N/A 03/20/2016   Procedure: REPAIR CYSTOCELE AND RECTOCELE;  Surgeon: Bjorn Loser, MD;  Location[facility];  Service: Urology;  Laterality: N/A;   BACK SURGERY  2019   has hardware   BREAST BIOPSY Left 02/23/2022   COLONOSCOPY     ESOPHAGEAL DILATION  2005   x 2   MOHS SURGERY  04/2020   SCC skin cancer removed from nose   OPEN REDUCTION INTERNAL FIXATION (ORIF) DISTAL RADIAL FRACTURE Left 05/11/2022   Procedure: OPEN REDUCTION INTERNAL FIXATION (ORIF) LEFT DISTAL RADIUS FRACTURE;  Surgeon: Leanora Cover, MD;  Location: [place];  Service: Orthopedics;  Laterality: Left;  90 MIN   VAGINAL HYSTERECTOMY Bilateral 03/20/2016   Procedure: TOTAL VAGINAL HYSTERECTOMY WITH BILATERAL SALPINGO OOPHORECTOMY mccall coloplasty;  Surgeon: Servando Salina, MD;  Location[facility];  Service: Gynecology;  Laterality: Bilateral;   VAGINAL PROLAPSE REPAIR N/A 03/20/2016   Procedure: REPAIR VAULT PROLAPSE AND GRAFT;  Surgeon: Bjorn Loser, MD;  [facility];  Service: Urology;  Laterality: N/A;   Family History  Problem Relation Age of Onset   Uterine cancer Mother    Breast cancer Mother    Lung disease Father    Breast cancer Sister    Breast cancer Maternal Aunt    Breast cancer Maternal Aunt    Breast cancer Maternal Aunt    Multiple myeloma Brother    Kidney cancer Brother    Social History   Socioeconomic History   Marital status: Married    Spouse name: [name]   Number of children: 2   Years of education: Not on file   Highest education level: Not on file  Occupational History   Occupation: Retired  Tobacco Use   Smoking status: Never   Smokeless tobacco: Never  Vaping Use   Vaping Use: Never used  Substance and Sexual Activity   Alcohol use: Not Currently    Alcohol/week: 1.0 standard drink of alcohol     Types: 1 Glasses of wine per week   Drug use: Never   Sexual activity: Yes    Birth control/protection: Post-menopausal, Surgical    Comment: Hysterectomy  Other Topics Concern   Not on file  Social History Narrative   Not on file   Social Determinants of Health   Financial Resource Strain: Low Risk  (08/16/2022)   Overall Financial Resource Strain (CARDIA)    Difficulty of Paying Living Expenses: Not hard at all  Food Insecurity: No Food Insecurity (08/16/2022)   Hunger Vital Sign    Worried About Running Out of Food in the Last Year: Never true    [place] in the  Last Year: Never true  Transportation Needs: No Transportation Needs (08/16/2022)   PRAPARE - Hydrologist (Medical): No    Lack of Transportation (Non-Medical): No  Physical Activity: Sufficiently Active (08/16/2022)   Exercise Vital Sign    Days of Exercise per Week: 5 days    Minutes of Exercise per Session: 30 min  Stress: No Stress Concern Present (08/16/2022)   [place]    Feeling of Stress : Not at all  Social Connections: [place] (08/16/2022)   Social Connection and Isolation Panel [NHANES]    Frequency of Communication with Friends and Family: More than three times a week    Frequency of Social Gatherings with Friends and Family: Twice a week    Attends Religious Services: More than 4 times per year    Active Member of [employer] or Organizations: Yes    Attends Music therapist: More than 4 times per year    Marital Status: Married    Tobacco Counseling Counseling given: Not Answered   Clinical Intake:  Pre-visit preparation completed: Yes  Pain : No/denies pain   BMI - recorded: 22.65 Nutritional Status: BMI of 19-24  Normal Nutritional Risks: None Diabetes: No How often do you need to have someone help you when you read instructions, pamphlets, or other written materials from your  doctor or pharmacy?: 1 - Never Interpreter Needed?: No    Activities of Daily Living    08/16/2022   10:04 AM 05/11/2022   12:08 PM  In your present state of health, do you have any difficulty performing the following activities:  Hearing? 1 0  Vision? 0 0  Difficulty concentrating or making decisions? 0 0  Walking or climbing stairs? 0 0  Dressing or bathing? 0 0  Doing errands, shopping? 0   Preparing Food and eating ? N   Using the Toilet? N   In the past six months, have you accidently leaked urine? Y   Do you have problems with loss of bowel control? N   Managing your Medications? N   Managing your Finances? N   Housekeeping or managing your Housekeeping? N     Patient Care Team: Rochel Brome, MD as PCP - General (Family Medicine) Berniece Salines, DO as PCP - Cardiology (Cardiology) Nehemiah Settle, MD (Gastroenterology) Newman Pies, MD as Consulting Physician (Neurosurgery)     Assessment:   This is a routine wellness examination for [place].  Hearing/Vision screen Hearing Screening  Method: Audiometry   _0   Right ear 40  Left ear 25    Dietary issues and exercise activities discussed: Current Exercise Habits: Home exercise routine, Type of exercise: walking, Time (Minutes): 30, Frequency (Times/Week): 6, Weekly Exercise (Minutes/Week): 180, Intensity: Mild, Exercise limited by: None identified   Goals Addressed             This Visit's Progress    Prevent falls         Depression Screen    08/16/2022   10:34 AM 07/02/2022    9:22 AM 05/15/2021    2:14 PM 05/08/2021    7:59 AM 12/28/2020    8:23 AM 06/16/2020    8:38 AM  PHQ 2/9 Scores  PHQ - 2 Score 0 0 0 0 0 0    Fall Risk    08/16/2022   10:38 AM 05/11/2022    7:34 AM 05/15/2021    2:13 PM 05/08/2021    7:59  AM 12/28/2020    8:23 AM  Fall Risk   Falls in the past year? 1 1 0 0 0  Number falls in past yr: 0 0 0 0 0  Injury with Fall? 1 1 0 0 0  Risk for fall due to : No Fall Risks No Fall  Risks No Fall Risks No Fall Risks   Follow up Falls evaluation completed;Education provided Falls evaluation completed Falls evaluation completed;Falls prevention discussed Falls evaluation completed     FALL RISK PREVENTION PERTAINING TO THE HOME:  Any stairs in or around the home? Yes  If so, are there any without handrails? No  Home free of loose throw rugs in walkways, pet beds, electrical cords, etc? Yes  Adequate lighting in your home to reduce risk of falls? Yes   ASSISTIVE DEVICES UTILIZED TO PREVENT FALLS:  Life alert? No  Use of a cane, walker or w/c? No  Grab bars in the bathroom? No  Shower chair or bench in shower? No  Elevated toilet seat or a handicapped toilet? No   Gait steady and fast without use of assistive device  Cognitive Function:        08/16/2022   10:39 AM 05/15/2021    2:15 PM  6CIT Screen  What Year? 0 points 0 points  What month? 0 points 0 points  What time? 0 points 0 points  Count back from 20 0 points 0 points  Months in reverse 0 points 0 points  Repeat phrase 0 points 0 points  Total Score 0 points 0 points    Immunizations Immunization History  Administered Date(s) Administered   Fluad Quad(high Dose 65+) 06/16/2020, 05/08/2021, 07/18/2022   Influenza-Unspecified 04/26/2017, 06/05/2019   Moderna Covid-19 Vaccine Bivalent Booster 19yr & up 11/06/2021   Moderna SARS-COV2 Booster Vaccination 12/28/2020   Moderna Sars-Covid-2 Vaccination 09/11/2019, 10/07/2019, 06/06/2020   Pneumococcal Polysaccharide-23 05/29/2013   Tdap 08/13/2014    TDAP status: Up to date  Flu Vaccine status: Up to date  Pneumococcal vaccine status: Declined,  Education has been provided regarding the importance of this vaccine but patient still declined. Advised may receive this vaccine at local pharmacy or Health Dept. Aware to provide a copy of the vaccination record if obtained from local pharmacy or Health Dept. Verbalized acceptance and understanding.    Covid-19 vaccine status: Information provided on how to obtain vaccines.   Qualifies for Shingles Vaccine? Yes   Zostavax completed No   Shingrix Completed?: Yes  Screening Tests Health Maintenance  Topic Date Due   Zoster Vaccines- Shingrix (1 of 2) Never done   Pneumonia Vaccine 72 Years old (2 - PCV) 06/08/2016   COVID-19 Vaccine (5 - 2023-24 season) 04/13/2022   Medicare Annual Wellness (AWV)  05/15/2022   MAMMOGRAM  02/08/2023   DTaP/Tdap/Td (2 - Td or Tdap) 08/13/2024   COLONOSCOPY (Pts 45-446yrInsurance coverage will need to be confirmed)  01/18/2027   INFLUENZA VACCINE  Completed   DEXA SCAN  Completed   Hepatitis C Screening  Completed   HPV VACCINES  Aged Out    Health Maintenance  Health Maintenance Due  Topic Date Due   Zoster Vaccines- Shingrix (1 of 2) Never done   Pneumonia Vaccine [age]Years old (2 - PCV) 06/08/2016   COVID-19 Vaccine (5 - 2023-24 season) 04/13/2022   Medicare Annual Wellness (AWV)  05/15/2022    Colorectal cancer screening: Type of screening: Colonoscopy. Completed 01/17/22. Repeat every 5 years  Mammogram status: Completed 02/07/22. Repeat every  year  Bone Density status: Completed 05/01/22. Results reflect: Bone density results: OSTEOPOROSIS. Repeat every 2 years.  Lung Cancer Screening: (Low Dose CT Chest recommended if Age 55-80 years, 30 pack-year currently smoking OR have quit w/in 15years.) does not qualify.   Lung Cancer Screening Referral: N/A  Additional Screening:  Hepatitis C Screening: does qualify; Completed 04/2021  Vision Screening: Recommended annual ophthalmology exams for early detection of glaucoma and other disorders of the eye. Is the patient up to date with their annual eye exam?  No  Who is the provider or what is the name of the office in which the patient attends annual eye exams? [facility]  Dental Screening: Recommended annual dental exams for proper oral hygiene  Community Resource Referral /  Chronic Care Management: CRR required this visit?  No   CCM required this visit?  No      Plan:     I have personally reviewed and noted the following in the patient's chart:   Medical and social history Use of alcohol, tobacco or illicit drugs  Current medications and supplements including opioid prescriptions. Patient is not currently taking opioid prescriptions. Functional ability and status Nutritional status Physical activity Advanced directives List of other physicians Hospitalizations, surgeries, and ER visits in previous 12 months Vitals Screenings to include cognitive, depression, and falls Referrals and appointments  In addition, I have reviewed and discussed with patient certain preventive protocols, quality metrics, and best practice recommendations. A written personalized care plan for preventive services as well as general preventive health recommendations were provided to patient.     Erie Noe, LPN   0/03/6760

## 2022-08-21 DIAGNOSIS — M62542 Muscle wasting and atrophy, not elsewhere classified, left hand: Secondary | ICD-10-CM | POA: Diagnosis not present

## 2022-08-21 DIAGNOSIS — M25432 Effusion, left wrist: Secondary | ICD-10-CM | POA: Diagnosis not present

## 2022-08-21 DIAGNOSIS — M25632 Stiffness of left wrist, not elsewhere classified: Secondary | ICD-10-CM | POA: Diagnosis not present

## 2022-08-21 DIAGNOSIS — M25532 Pain in left wrist: Secondary | ICD-10-CM | POA: Diagnosis not present

## 2022-08-24 DIAGNOSIS — M62542 Muscle wasting and atrophy, not elsewhere classified, left hand: Secondary | ICD-10-CM | POA: Diagnosis not present

## 2022-08-24 DIAGNOSIS — M25432 Effusion, left wrist: Secondary | ICD-10-CM | POA: Diagnosis not present

## 2022-08-24 DIAGNOSIS — M25632 Stiffness of left wrist, not elsewhere classified: Secondary | ICD-10-CM | POA: Diagnosis not present

## 2022-08-24 DIAGNOSIS — M25532 Pain in left wrist: Secondary | ICD-10-CM | POA: Diagnosis not present

## 2022-08-28 DIAGNOSIS — M62542 Muscle wasting and atrophy, not elsewhere classified, left hand: Secondary | ICD-10-CM | POA: Diagnosis not present

## 2022-08-28 DIAGNOSIS — M25532 Pain in left wrist: Secondary | ICD-10-CM | POA: Diagnosis not present

## 2022-08-28 DIAGNOSIS — M25632 Stiffness of left wrist, not elsewhere classified: Secondary | ICD-10-CM | POA: Diagnosis not present

## 2022-08-28 DIAGNOSIS — M25432 Effusion, left wrist: Secondary | ICD-10-CM | POA: Diagnosis not present

## 2022-08-30 DIAGNOSIS — M62542 Muscle wasting and atrophy, not elsewhere classified, left hand: Secondary | ICD-10-CM | POA: Diagnosis not present

## 2022-08-30 DIAGNOSIS — M25432 Effusion, left wrist: Secondary | ICD-10-CM | POA: Diagnosis not present

## 2022-08-30 DIAGNOSIS — M25532 Pain in left wrist: Secondary | ICD-10-CM | POA: Diagnosis not present

## 2022-08-30 DIAGNOSIS — M25632 Stiffness of left wrist, not elsewhere classified: Secondary | ICD-10-CM | POA: Diagnosis not present

## 2022-09-04 DIAGNOSIS — M25532 Pain in left wrist: Secondary | ICD-10-CM | POA: Diagnosis not present

## 2022-09-04 DIAGNOSIS — M25632 Stiffness of left wrist, not elsewhere classified: Secondary | ICD-10-CM | POA: Diagnosis not present

## 2022-09-04 DIAGNOSIS — M25432 Effusion, left wrist: Secondary | ICD-10-CM | POA: Diagnosis not present

## 2022-09-04 DIAGNOSIS — M62542 Muscle wasting and atrophy, not elsewhere classified, left hand: Secondary | ICD-10-CM | POA: Diagnosis not present

## 2022-09-06 DIAGNOSIS — M25632 Stiffness of left wrist, not elsewhere classified: Secondary | ICD-10-CM | POA: Diagnosis not present

## 2022-09-06 DIAGNOSIS — M25432 Effusion, left wrist: Secondary | ICD-10-CM | POA: Diagnosis not present

## 2022-09-06 DIAGNOSIS — M62542 Muscle wasting and atrophy, not elsewhere classified, left hand: Secondary | ICD-10-CM | POA: Diagnosis not present

## 2022-09-06 DIAGNOSIS — M25532 Pain in left wrist: Secondary | ICD-10-CM | POA: Diagnosis not present

## 2022-10-08 ENCOUNTER — Other Ambulatory Visit: Payer: Self-pay

## 2022-10-08 DIAGNOSIS — I10 Essential (primary) hypertension: Secondary | ICD-10-CM

## 2022-10-08 MED ORDER — METOPROLOL SUCCINATE ER 25 MG PO TB24: 25.0000 mg | ORAL_TABLET | Freq: Every day | ORAL | 1 refills | Status: DC

## 2022-10-09 ENCOUNTER — Other Ambulatory Visit: Payer: Self-pay | Admitting: Family Medicine

## 2022-10-11 ENCOUNTER — Other Ambulatory Visit: Payer: Self-pay

## 2022-10-11 DIAGNOSIS — I10 Essential (primary) hypertension: Secondary | ICD-10-CM

## 2022-10-11 MED ORDER — METOPROLOL SUCCINATE ER 25 MG PO TB24: 25.0000 mg | ORAL_TABLET | Freq: Every day | ORAL | 1 refills | Status: DC

## 2022-10-11 MED ORDER — VALSARTAN 320 MG PO TABS: 320.0000 mg | ORAL_TABLET | Freq: Every day | ORAL | 1 refills | Status: DC

## 2022-10-11 NOTE — Telephone Encounter (Signed)
Pharmacy stated they did not get refills that were sent.

## 2022-11-05 ENCOUNTER — Other Ambulatory Visit: Payer: Self-pay

## 2022-11-05 DIAGNOSIS — I10 Essential (primary) hypertension: Secondary | ICD-10-CM

## 2022-11-05 MED ORDER — METOPROLOL SUCCINATE ER 25 MG PO TB24: 25.0000 mg | ORAL_TABLET | Freq: Every day | ORAL | 1 refills | Status: DC

## 2022-11-11 NOTE — Assessment & Plan Note (Addendum)
Well controlled.  No changes to medicines. Valsartan 320 mg daily, HCTZ 12.5 mg daily, Metoprolol 25 mg daily. Continue to work on eating a healthy diet and exercise.  Labs drawn today.

## 2022-11-11 NOTE — Assessment & Plan Note (Signed)
Continue Pepcid once daily. 

## 2022-11-11 NOTE — Assessment & Plan Note (Signed)
Recommend continue to work on eating healthy diet and exercise. Plan to change Atorvastatin to alternative statin.

## 2022-11-11 NOTE — Progress Notes (Unsigned)
Subjective:  Patient ID: Crystal Oneal, female    DOB: Jul 24, 1951  Age: 72 y.o. MRN: [id number]  Chief Complaint  Patient presents with   Hyperlipidemia    HPI   Pt presents for follow-up of hypertension. She denies chest pain, headache, dyspnea, or dizziness. She is adherent to medication regimen and follow-up appointments.   Hypertension, follow-up:  She was last seen for hypertension .  BP at that visit was 140/90. Management includes Metoprolol, Valsartan and HCTZ. She reports excellent compliance with treatment. She is not having side effects.  She is following a Regular diet. She is exercising. She does not smoke.   Outside blood pressure readings:   Hyperlipidemia: Taking Atorvastatin 10 mg daily, Fish Oil.    08/16/2022   10:34 AM 07/02/2022    9:22 AM 05/15/2021    2:14 PM 05/08/2021    7:59 AM 12/28/2020    8:23 AM  Depression screen PHQ 2/9  Decreased Interest 0 0 0 0 0  Down, Depressed, Hopeless 0 0 0 0 0  PHQ - 2 Score 0 0 0 0 0         05/22/2021    1:46 PM 05/22/2021    7:32 PM 05/03/2022    7:59 PM 05/11/2022    7:34 AM 08/16/2022   10:38 AM  Fall Risk  Falls in the past year?    1 1  Was there an injury with Fall?    1 1  Fall Risk Category Calculator    2 2  Fall Risk Category (Retired)    Moderate Moderate  (RETIRED) Patient Fall Risk Level Moderate fall risk Moderate fall risk Low fall risk Low fall risk Low fall risk  Patient at Risk for Falls Due to    No Fall Risks No Fall Risks  Fall risk Follow up    Falls evaluation completed Falls evaluation completed;Education provided      Review of Systems  Current Outpatient Medications on File Prior to Visit  Medication Sig Dispense Refill   atorvastatin (LIPITOR) 10 MG tablet Take 1 tablet (10 mg total) by mouth 2 (two) times a week. 26 tablet 3   b complex vitamins tablet Take 1 tablet by mouth every evening.     calcium citrate-vitamin D (CITRACAL+D) 315-200 MG-UNIT tablet Take 2 tablets by  mouth 2 (two) times daily.     cetirizine (ZYRTEC) 10 MG tablet Take 5-10 mg by mouth daily as needed for allergies (during spring months).     Cholecalciferol (VITAMIN D) 2000 units tablet Take 2,000 Units by mouth in the morning.     denosumab (PROLIA) 60 MG/ML SOSY injection Inject 60 mg into the skin every 6 (six) months.     famotidine (PEPCID) 40 MG tablet Take 1 tablet (40 mg total) by mouth daily. 90 tablet 1   fluticasone (FLONASE) 50 MCG/ACT nasal spray Place 1 spray into both nostrils daily as needed for allergies or rhinitis.     Ginger, Zingiber officinalis, (GINGER ROOT) 550 MG CAPS Take 1 capsule by mouth in the morning and at bedtime.     hydrochlorothiazide (HYDRODIURIL) 12.5 MG tablet Take 1 tablet (12.5 mg total) by mouth daily. 90 tablet 3   metoprolol succinate (TOPROL-XL) 25 MG 24 hr tablet Take 1 tablet (25 mg total) by mouth daily. 90 tablet 1   Olopatadine HCl (PATADAY OP) Place 1 drop into both eyes daily as needed (allergy).     Omega-3 Fatty Acids (FISH OIL PO) Take  2,400 mg by mouth in the morning.     TURMERIC CURCUMIN PO Take 1,000 mg by mouth in the morning.     valsartan (DIOVAN) 320 MG tablet Take 1 tablet (320 mg total) by mouth daily. 90 tablet 1   vitamin B-12 (CYANOCOBALAMIN) 1000 MCG tablet Take 1,000 mcg by mouth in the morning.     vitamin C (ASCORBIC ACID) 250 MG tablet Take 250 mg by mouth 2 (two) times daily.     No current facility-administered medications on file prior to visit.   Past Medical History:  Diagnosis Date   Age-related osteoporosis without current pathological fracture    Arthritis    Cancer ([place]) 04/2020   SCC skin cancer removed from nose   Cervical stenosis of spine    C11-12   Dysphagia    Esophageal dilation x2   Essential (primary) hypertension    GERD (gastroesophageal reflux disease)    Hypertension    Mixed hyperlipidemia    Paroxysmal SVT (supraventricular tachycardia)    PONV (postoperative nausea and vomiting)     Spondylolisthesis of lumbar region    Past Surgical History:  Procedure Laterality Date   ABDOMINAL HYSTERECTOMY     ANTERIOR AND POSTERIOR REPAIR N/A 03/20/2016   Procedure: REPAIR CYSTOCELE AND RECTOCELE;  Surgeon: Bjorn Loser, MD;  [facility];  Service: Urology;  Laterality: N/A;   BACK SURGERY  2019   has hardware   BREAST BIOPSY Left 02/23/2022   COLONOSCOPY     ESOPHAGEAL DILATION  2005   x 2   MOHS SURGERY  04/2020   SCC skin cancer removed from nose   OPEN REDUCTION INTERNAL FIXATION (ORIF) DISTAL RADIAL FRACTURE Left 05/11/2022   Procedure: OPEN REDUCTION INTERNAL FIXATION (ORIF) LEFT DISTAL RADIUS FRACTURE;  Surgeon: Leanora Cover, MD;  Location: [place];  Service: Orthopedics;  Laterality: Left;  90 MIN   VAGINAL HYSTERECTOMY Bilateral 03/20/2016   Procedure: TOTAL VAGINAL HYSTERECTOMY WITH BILATERAL SALPINGO OOPHORECTOMY mccall coloplasty;  Surgeon: Servando Salina, MD;  [facility];  Service: Gynecology;  Laterality: Bilateral;   VAGINAL PROLAPSE REPAIR N/A 03/20/2016   Procedure: REPAIR VAULT PROLAPSE AND GRAFT;  Surgeon: Bjorn Loser, MD;  [facility];  Service: Urology;  Laterality: N/A;    Family History  Problem Relation Age of Onset   Uterine cancer Mother    Breast cancer Mother    Lung disease Father    Breast cancer Sister    Breast cancer Maternal Aunt    Breast cancer Maternal Aunt    Breast cancer Maternal Aunt    Multiple myeloma Brother    Kidney cancer Brother    Social History   Socioeconomic History   Marital status: Married    Spouse name: [name]   Number of children: 2   Years of education: Not on file   Highest education level: Not on file  Occupational History   Occupation: Retired  Tobacco Use   Smoking status: Never   Smokeless tobacco: Never  Vaping Use   Vaping Use: Never used  Substance and Sexual Activity   Alcohol use: Not Currently    Alcohol/week: 1.0 standard drink of alcohol     Types: 1 Glasses of wine per week   Drug use: Never   Sexual activity: Yes    Birth control/protection: Post-menopausal, Surgical    Comment: Hysterectomy  Other Topics Concern   Not on file  Social History Narrative   Not on file   Social Determinants  of Health   Financial Resource Strain: Low Risk  (08/16/2022)   Overall Financial Resource Strain (CARDIA)    Difficulty of Paying Living Expenses: Not hard at all  Food Insecurity: No Food Insecurity (08/16/2022)   Hunger Vital Sign    Worried About Running Out of Food in the Last Year: Never true    Ran Out of Food in the Last Year: Never true  Transportation Needs: No Transportation Needs (08/16/2022)   PRAPARE - Hydrologist (Medical): No    Lack of Transportation (Non-Medical): No  Physical Activity: Sufficiently Active (08/16/2022)   Exercise Vital Sign    Days of Exercise per Week: 5 days    Minutes of Exercise per Session: 30 min  Stress: No Stress Concern Present (08/16/2022)   [place]    Feeling of Stress : Not at all  Social Connections: [place] (08/16/2022)   Social Connection and Isolation Panel [NHANES]    Frequency of Communication with Friends and Family: More than three times a week    Frequency of Social Gatherings with Friends and Family: Twice a week    Attends Religious Services: More than 4 times per year    Active Member of [employer] or Organizations: Yes    Attends Music therapist: More than 4 times per year    Marital Status: Married    Objective:  There were no vitals taken for this visit.     08/16/2022   10:28 AM 07/31/2022   11:23 AM 07/18/2022   10:14 AM  BP/Weight  Systolic BP [id number]  Diastolic BP 67 84 90  Wt. (Lbs) 116 116   BMI 22.65 kg/m2 22.65 kg/m2     Physical Exam  Diabetic Foot Exam - Simple   No data filed      Lab Results  Component Value Date   WBC 5.6  05/11/2022   HGB 12.6 05/11/2022   HCT 38.0 05/11/2022   PLT 232 05/11/2022   GLUCOSE 90 07/31/2022   CHOL 182 05/11/2022   TRIG 51 05/11/2022   HDL 72 05/11/2022   LDLCALC 100 (H) 05/11/2022   ALT 25 07/31/2022   AST 35 07/31/2022   NA 139 07/31/2022   K 4.0 07/31/2022   CL 102 07/31/2022   CREATININE 0.78 07/31/2022   BUN 15 07/31/2022   CO2 24 07/31/2022   TSH 4.900 (H) 11/06/2021   INR 1.0 05/11/2022      Assessment & Plan:    Essential (primary) hypertension Assessment & Plan: Well controlled.  No changes to medicines. Valsartan 320 mg daily, HCTZ 12.5 mg daily, Metoprolol 25 mg daily. Continue to work on eating a healthy diet and exercise.  Labs drawn today.     Gastroesophageal reflux disease without esophagitis Assessment & Plan: Continue Pepcid once daily.   Mixed hyperlipidemia Assessment & Plan: Recommend continue to work on eating healthy diet and exercise. Continue with Fish oil, Atorvastatin 10 mg daily.      No orders of the defined types were placed in this encounter.   No orders of the defined types were placed in this encounter.    Follow-up: No follow-ups on file.   Geralynn Ochs I Leal-Borjas,acting as a scribe for Rochel Brome, MD.,have documented all relevant documentation on the behalf of Rochel Brome, MD,as directed by  Rochel Brome, MD while in the presence of Rochel Brome, MD.   An After Visit Summary was printed  and given to the patient.  Rochel Brome, MD [facility] [phone]) [phone] continue to work on eating healthy diet and exercise.

## 2022-11-12 ENCOUNTER — Ambulatory Visit (INDEPENDENT_AMBULATORY_CARE_PROVIDER_SITE_OTHER): Payer: PPO | Admitting: Family Medicine

## 2022-11-12 ENCOUNTER — Encounter: Payer: Self-pay | Admitting: Family Medicine

## 2022-11-12 VITALS — BP 110/62 | HR 76 | Temp 95.7°F | Resp 14 | Ht 61.0 in | Wt 120.0 lb

## 2022-11-12 DIAGNOSIS — I10 Essential (primary) hypertension: Secondary | ICD-10-CM | POA: Diagnosis not present

## 2022-11-12 DIAGNOSIS — E782 Mixed hyperlipidemia: Secondary | ICD-10-CM

## 2022-11-12 DIAGNOSIS — R0789 Other chest pain: Secondary | ICD-10-CM | POA: Diagnosis not present

## 2022-11-12 DIAGNOSIS — R7989 Other specified abnormal findings of blood chemistry: Secondary | ICD-10-CM | POA: Diagnosis not present

## 2022-11-12 DIAGNOSIS — K219 Gastro-esophageal reflux disease without esophagitis: Secondary | ICD-10-CM | POA: Diagnosis not present

## 2022-11-12 DIAGNOSIS — R7301 Impaired fasting glucose: Secondary | ICD-10-CM | POA: Diagnosis not present

## 2022-11-12 NOTE — Assessment & Plan Note (Signed)
Check tsh 

## 2022-11-12 NOTE — Assessment & Plan Note (Signed)
Thorough cardiac work up in 2022. Reassurance given.

## 2022-11-13 LAB — LIPID PANEL
Chol/HDL Ratio: 2.9 ratio (ref 0.0–4.4)
Cholesterol, Total: 191 mg/dL (ref 100–199)
HDL: 67 mg/dL (ref >39)
LDL Chol Calc (NIH): 113 mg/dL — ABNORMAL HIGH (ref 0–99)
Triglycerides: 56 mg/dL (ref 0–149)
VLDL Cholesterol Cal: 11 mg/dL (ref 5–40)

## 2022-11-13 LAB — COMPREHENSIVE METABOLIC PANEL
ALT: 26 IU/L (ref 0–32)
AST: 36 IU/L (ref 0–40)
Albumin/Globulin Ratio: 1.7 (ref 1.2–2.2)
Albumin: 4.2 g/dL (ref 3.8–4.8)
Alkaline Phosphatase: 31 IU/L — ABNORMAL LOW (ref 44–121)
BUN/Creatinine Ratio: 17 (ref 12–28)
BUN: 13 mg/dL (ref 8–27)
Bilirubin Total: 0.5 mg/dL (ref 0.0–1.2)
CO2: 24 mmol/L (ref 20–29)
Calcium: 9.8 mg/dL (ref 8.7–10.3)
Chloride: 102 mmol/L (ref 96–106)
Creatinine, Ser: 0.78 mg/dL (ref 0.57–1.00)
Globulin, Total: 2.5 g/dL (ref 1.5–4.5)
Glucose: 100 mg/dL — ABNORMAL HIGH (ref 70–99)
Potassium: 5.5 mmol/L — ABNORMAL HIGH (ref 3.5–5.2)
Sodium: 138 mmol/L (ref 134–144)
Total Protein: 6.7 g/dL (ref 6.0–8.5)
eGFR: 81 mL/min/{1.73_m2} (ref >59)

## 2022-11-13 LAB — CBC WITH DIFFERENTIAL/PLATELET
Basophils Absolute: 0 10*3/uL (ref 0.0–0.2)
Basos: 1 %
EOS (ABSOLUTE): 0.2 10*3/uL (ref 0.0–0.4)
Eos: 4 %
Hematocrit: 35.3 % (ref 34.0–46.6)
Hemoglobin: 11.5 g/dL (ref 11.1–15.9)
Immature Grans (Abs): 0 10*3/uL (ref 0.0–0.1)
Immature Granulocytes: 0 %
Lymphocytes Absolute: 1.6 10*3/uL (ref 0.7–3.1)
Lymphs: 46 %
MCH: 29.5 pg (ref 26.6–33.0)
MCHC: 32.6 g/dL (ref 31.5–35.7)
MCV: 91 fL (ref 79–97)
Monocytes Absolute: 0.5 10*3/uL (ref 0.1–0.9)
Monocytes: 14 %
Neutrophils Absolute: 1.3 10*3/uL — ABNORMAL LOW (ref 1.4–7.0)
Neutrophils: 35 %
Platelets: 201 10*3/uL (ref 150–450)
RBC: 3.9 x10E6/uL (ref 3.77–5.28)
RDW: 12.2 % (ref 11.7–15.4)
WBC: 3.6 10*3/uL (ref 3.4–10.8)

## 2022-11-13 LAB — TSH: TSH: 3.21 u[IU]/mL (ref 0.450–4.500)

## 2022-11-13 LAB — CARDIOVASCULAR RISK ASSESSMENT

## 2022-11-14 ENCOUNTER — Other Ambulatory Visit: Payer: Self-pay

## 2022-11-14 ENCOUNTER — Other Ambulatory Visit: Payer: PPO

## 2022-11-14 DIAGNOSIS — E875 Hyperkalemia: Secondary | ICD-10-CM | POA: Diagnosis not present

## 2022-11-14 LAB — HEMOGLOBIN A1C
Est. average glucose Bld gHb Est-mCnc: 126 mg/dL
Hgb A1c MFr Bld: 6 % — ABNORMAL HIGH (ref 4.8–5.6)

## 2022-11-14 LAB — SPECIMEN STATUS REPORT

## 2022-11-15 ENCOUNTER — Other Ambulatory Visit: Payer: Self-pay | Admitting: Family Medicine

## 2022-11-15 DIAGNOSIS — N6320 Unspecified lump in the left breast, unspecified quadrant: Secondary | ICD-10-CM

## 2022-11-15 LAB — COMPREHENSIVE METABOLIC PANEL
ALT: 26 IU/L (ref 0–32)
AST: 38 IU/L (ref 0–40)
Albumin/Globulin Ratio: 1.7 (ref 1.2–2.2)
Albumin: 4.6 g/dL (ref 3.8–4.8)
Alkaline Phosphatase: 35 IU/L — ABNORMAL LOW (ref 44–121)
BUN/Creatinine Ratio: 18 (ref 12–28)
BUN: 18 mg/dL (ref 8–27)
Bilirubin Total: 0.3 mg/dL (ref 0.0–1.2)
CO2: 25 mmol/L (ref 20–29)
Calcium: 10.3 mg/dL (ref 8.7–10.3)
Chloride: 97 mmol/L (ref 96–106)
Creatinine, Ser: 1 mg/dL (ref 0.57–1.00)
Globulin, Total: 2.7 g/dL (ref 1.5–4.5)
Glucose: 88 mg/dL (ref 70–99)
Potassium: 4.4 mmol/L (ref 3.5–5.2)
Sodium: 136 mmol/L (ref 134–144)
Total Protein: 7.3 g/dL (ref 6.0–8.5)
eGFR: 60 mL/min/{1.73_m2} (ref >59)

## 2022-11-28 ENCOUNTER — Other Ambulatory Visit: Payer: PPO

## 2022-11-30 ENCOUNTER — Telehealth: Payer: Self-pay

## 2022-11-30 NOTE — Telephone Encounter (Signed)
LM for patient to call to schedule her Prolia injection.    **Can schedule anytime after 4/19 - we have it in stock**

## 2022-12-03 ENCOUNTER — Telehealth: Payer: Self-pay

## 2022-12-03 NOTE — Telephone Encounter (Signed)
PATIENT INFORMED AND SCHEDULED TO COME TOMORROW FOR PROLIA AND WILL CALL us 30 MINUTES BEFORE SHE COMES.

## 2022-12-03 NOTE — Telephone Encounter (Signed)
ERROR

## 2022-12-04 ENCOUNTER — Ambulatory Visit (INDEPENDENT_AMBULATORY_CARE_PROVIDER_SITE_OTHER): Payer: PPO

## 2022-12-04 DIAGNOSIS — M810 Age-related osteoporosis without current pathological fracture: Secondary | ICD-10-CM

## 2022-12-04 MED ORDER — DENOSUMAB 60 MG/ML ~~LOC~~ SOSY
60.0000 mg | PREFILLED_SYRINGE | Freq: Once | SUBCUTANEOUS | Status: AC
Start: 2022-12-04 — End: 2022-12-04
  Administered 2022-12-04: 60 mg via SUBCUTANEOUS

## 2022-12-04 NOTE — Progress Notes (Signed)
Patient came in office for prolia injection.

## 2022-12-05 ENCOUNTER — Ambulatory Visit
Admission: RE | Admit: 2022-12-05 | Discharge: 2022-12-05 | Disposition: A | Discharge status: Home / Self Care | Payer: PPO | Source: Ambulatory Visit | Attending: Family Medicine | Admitting: Family Medicine

## 2022-12-05 DIAGNOSIS — N6323 Unspecified lump in the left breast, lower outer quadrant: Secondary | ICD-10-CM | POA: Diagnosis not present

## 2022-12-05 DIAGNOSIS — N6320 Unspecified lump in the left breast, unspecified quadrant: Secondary | ICD-10-CM

## 2023-01-03 ENCOUNTER — Other Ambulatory Visit: Payer: Self-pay | Admitting: Acute Care

## 2023-01-03 DIAGNOSIS — Z1231 Encounter for screening mammogram for malignant neoplasm of breast: Secondary | ICD-10-CM

## 2023-02-11 ENCOUNTER — Ambulatory Visit
Admission: RE | Admit: 2023-02-11 | Discharge: 2023-02-11 | Disposition: A | Discharge status: Home / Self Care | Payer: PPO | Source: Ambulatory Visit | Attending: Acute Care | Admitting: Acute Care

## 2023-02-11 DIAGNOSIS — Z1231 Encounter for screening mammogram for malignant neoplasm of breast: Secondary | ICD-10-CM | POA: Diagnosis not present

## 2023-03-26 DIAGNOSIS — M5136 Other intervertebral disc degeneration, lumbar region: Secondary | ICD-10-CM | POA: Diagnosis not present

## 2023-03-26 DIAGNOSIS — M4316 Spondylolisthesis, lumbar region: Secondary | ICD-10-CM | POA: Diagnosis not present

## 2023-04-07 ENCOUNTER — Other Ambulatory Visit: Payer: Self-pay | Admitting: Family Medicine

## 2023-04-23 ENCOUNTER — Other Ambulatory Visit: Payer: Self-pay | Admitting: Family Medicine

## 2023-05-13 NOTE — Progress Notes (Unsigned)
Subjective:  Patient ID: Crystal Oneal, female    DOB: Apr 29, 1951  Age: 72 y.o. MRN: [id number]  Chief Complaint  Patient presents with   Medical Management of Chronic Issues    HPI Pt presents for follow-up of hypertension. She denies chest pain, headache or  dyspnea, She is adherent to medication regimen and follow-up appointments.    Hypertension and SVT, follow-up:  She was last seen for hypertension. Management includes Metoprolol 12.5 every day, Valsartan 320 MG every day, and hydrochlorothiazide 12.5 MG every day. She is following a healthy diet. Started a vegetarian diet and has incorporated some meat. She is exercising.    Hyperlipidemia: Was taking Atorvastatin 10 mg twice weekly but stopped because of nausea/malaise.  She has been taking fish oil.    Prediabetes: A1C 6.0.     05/14/2023    8:45 AM 11/12/2022    8:39 AM 08/16/2022   10:34 AM 07/02/2022    9:22 AM 05/15/2021    2:14 PM  Depression screen PHQ 2/9  Decreased Interest 0 0 0 0 0  Down, Depressed, Hopeless 0 0 0 0 0  PHQ - 2 Score 0 0 0 0 0  Altered sleeping 3      Tired, decreased energy 0      Change in appetite 0      Feeling bad or failure about yourself  0      Trouble concentrating 0      Moving slowly or fidgety/restless 0      Suicidal thoughts 0      PHQ-9 Score 3      Difficult doing work/chores Not difficult at all            05/14/2023    8:45 AM  Fall Risk   Falls in the past year? 0  Number falls in past yr: 0  Injury with Fall? 0  Risk for fall due to : No Fall Risks  Follow up Falls evaluation completed;Falls prevention discussed    Patient Care Team: Blane Ohara, MD as PCP - General (Family Medicine) Thomasene Ripple, DO as PCP - Cardiology (Cardiology) Webb Silversmith, MD (Gastroenterology) Tressie Stalker, MD as Consulting Physician (Neurosurgery)   Review of Systems  Constitutional:  Negative for chills, fatigue and fever.  HENT:  Positive for rhinorrhea. Negative for  congestion, ear pain and sore throat.   Respiratory:  Negative for cough and shortness of breath.   Cardiovascular:  Negative for chest pain.  Gastrointestinal:  Negative for abdominal pain, constipation, diarrhea, nausea and vomiting.  Genitourinary:  Negative for dysuria and urgency.  Musculoskeletal:  Positive for arthralgias (tylenol). Negative for back pain and myalgias.  Neurological:  Positive for headaches. Negative for dizziness, weakness and light-headedness.  Psychiatric/Behavioral:  Negative for dysphoric mood. The patient is not nervous/anxious.     Current Outpatient Medications on File Prior to Visit  Medication Sig Dispense Refill   b complex vitamins tablet Take 1 tablet by mouth every evening.     calcium citrate-vitamin D (CITRACAL+D) 315-200 MG-UNIT tablet Take 2 tablets by mouth 2 (two) times daily.     cetirizine (ZYRTEC) 10 MG tablet Take 5-10 mg by mouth daily as needed for allergies (during spring months).     Cholecalciferol (VITAMIN D) 2000 units tablet Take 2,000 Units by mouth in the morning.     denosumab (PROLIA) 60 MG/ML SOSY injection Inject 60 mg into the skin every 6 (six) months.     famotidine (PEPCID) 40 MG  tablet TAKE ONE TABELT BY MOUTH DAILY 90 tablet 1   fluticasone (FLONASE) 50 MCG/ACT nasal spray Place 1 spray into both nostrils daily as needed for allergies or rhinitis.     Ginger, Zingiber officinalis, (GINGER ROOT) 550 MG CAPS Take 1 capsule by mouth in the morning and at bedtime.     hydrochlorothiazide (HYDRODIURIL) 12.5 MG tablet Take 1 tablet (12.5 mg total) by mouth daily. 90 tablet 3   metoprolol succinate (TOPROL-XL) 25 MG 24 hr tablet Take 1 tablet (25 mg total) by mouth daily. (Patient taking differently: Take 12.5 mg by mouth daily.) 90 tablet 1   Omega-3 Fatty Acids (FISH OIL PO) Take 2,400 mg by mouth in the morning.     TURMERIC CURCUMIN PO Take 1,000 mg by mouth in the morning.     valsartan (DIOVAN) 320 MG tablet Take 1 tablet (320  mg total) by mouth daily. 90 tablet 1   vitamin B-12 (CYANOCOBALAMIN) 1000 MCG tablet Take 1,000 mcg by mouth in the morning.     vitamin C (ASCORBIC ACID) 250 MG tablet Take 250 mg by mouth 2 (two) times daily.     No current facility-administered medications on file prior to visit.   Past Medical History:  Diagnosis Date   Age-related osteoporosis without current pathological fracture    Arthritis    Cancer (HCC) 04/2020   SCC skin cancer removed from nose   Cervical stenosis of spine    C11-12   Dysphagia    Esophageal dilation x2   Essential (primary) hypertension    GERD (gastroesophageal reflux disease)    Hypertension    Mixed hyperlipidemia    Paroxysmal SVT (supraventricular tachycardia) (HCC)    PONV (postoperative nausea and vomiting)    Spondylolisthesis of lumbar region    Past Surgical History:  Procedure Laterality Date   ABDOMINAL HYSTERECTOMY     ANTERIOR AND POSTERIOR REPAIR N/A 03/20/2016   Procedure: REPAIR CYSTOCELE AND RECTOCELE;  Surgeon: Alfredo Martinez, MD;  Location: [facility];  Service: Urology;  Laterality: N/A;   BACK SURGERY  2019   has hardware   BREAST BIOPSY Left 02/23/2022   COLONOSCOPY     ESOPHAGEAL DILATION  2005   x 2   MOHS SURGERY  04/2020   SCC skin cancer removed from nose   OPEN REDUCTION INTERNAL FIXATION (ORIF) DISTAL RADIAL FRACTURE Left 05/11/2022   Procedure: OPEN REDUCTION INTERNAL FIXATION (ORIF) LEFT DISTAL RADIUS FRACTURE;  Surgeon: Betha Loa, MD;  Location: [facility];  Service: Orthopedics;  Laterality: Left;  90 MIN   VAGINAL HYSTERECTOMY Bilateral 03/20/2016   Procedure: TOTAL VAGINAL HYSTERECTOMY WITH BILATERAL SALPINGO OOPHORECTOMY mccall coloplasty;  Surgeon: Maxie Better, MD;  Location: [facility];  Service: Gynecology;  Laterality: Bilateral;   VAGINAL PROLAPSE REPAIR N/A 03/20/2016   Procedure: REPAIR VAULT PROLAPSE AND GRAFT;  Surgeon: Alfredo Martinez, MD;  Location: [facility];  Service: Urology;   Laterality: N/A;    Family History  Problem Relation Age of Onset   Uterine cancer Mother    Breast cancer Mother    Lung disease Father    Breast cancer Sister    Breast cancer Maternal Aunt    Breast cancer Maternal Aunt    Breast cancer Maternal Aunt    Multiple myeloma Brother    Kidney cancer Brother    Social History   Socioeconomic History   Marital status: Married    Spouse name: Jake Shark   Number of children: 2   Years  of education: Not on file   Highest education level: Some college, no degree  Occupational History   Occupation: Retired  Tobacco Use   Smoking status: Never   Smokeless tobacco: Never  Vaping Use   Vaping status: Never Used  Substance and Sexual Activity   Alcohol use: Not Currently    Alcohol/week: 1.0 standard drink of alcohol    Types: 1 Glasses of wine per week   Drug use: Never   Sexual activity: Yes    Birth control/protection: Post-menopausal, Surgical    Comment: Hysterectomy  Other Topics Concern   Not on file  Social History Narrative   Not on file   Social Determinants of Health   Financial Resource Strain: Low Risk  (05/13/2023)   Overall Financial Resource Strain (CARDIA)    Difficulty of Paying Living Expenses: Not hard at all  Food Insecurity: No Food Insecurity (05/13/2023)   Hunger Vital Sign    Worried About Running Out of Food in the Last Year: Never true    Ran Out of Food in the Last Year: Never true  Transportation Needs: No Transportation Needs (05/13/2023)   PRAPARE - Administrator, Civil Service (Medical): No    Lack of Transportation (Non-Medical): No  Physical Activity: Sufficiently Active (05/13/2023)   Exercise Vital Sign    Days of Exercise per Week: 6 days    Minutes of Exercise per Session: 30 min  Stress: No Stress Concern Present (05/13/2023)   [employer] of Occupational Health - Occupational Stress Questionnaire    Feeling of Stress : Only a little  Social Connections: Socially  Integrated (05/13/2023)   Social Connection and Isolation Panel [NHANES]    Frequency of Communication with Friends and Family: More than three times a week    Frequency of Social Gatherings with Friends and Family: Once a week    Attends Religious Services: More than 4 times per year    Active Member of [employer] or Organizations: Yes    Attends Engineer, structural: More than 4 times per year    Marital Status: Married    Objective:  BP 120/72   Pulse 64   Temp (!) 96.2 F (35.7 C)   Resp 14   Ht 5\' 1"  (1.549 m)   Wt 109 lb 9.6 oz (49.7 kg)   BMI 20.71 kg/m      05/14/2023    8:40 AM 11/12/2022    8:34 AM 08/16/2022   10:28 AM  BP/Weight  Systolic BP 120 110 103  Diastolic BP 72 62 67  Wt. (Lbs) 109.6 120 116  BMI 20.71 kg/m2 22.67 kg/m2 22.65 kg/m2    Physical Exam Vitals reviewed.  Constitutional:      Appearance: Normal appearance. She is normal weight.  Neck:     Vascular: No carotid bruit.  Cardiovascular:     Rate and Rhythm: Normal rate and regular rhythm.     Heart sounds: Normal heart sounds.  Pulmonary:     Effort: Pulmonary effort is normal. No respiratory distress.     Breath sounds: Normal breath sounds.  Abdominal:     General: Abdomen is flat. Bowel sounds are normal.     Palpations: Abdomen is soft.     Tenderness: There is no abdominal tenderness.  Neurological:     Mental Status: She is alert and oriented to person, place, and time.  Psychiatric:        Mood and Affect: Mood normal.  Behavior: Behavior normal.     Diabetic Foot Exam - Simple   No data filed      Lab Results  Component Value Date   WBC 3.6 11/12/2022   HGB 11.5 11/12/2022   HCT 35.3 11/12/2022   PLT 201 11/12/2022   GLUCOSE 88 11/14/2022   CHOL 191 11/12/2022   TRIG 56 11/12/2022   HDL 67 11/12/2022   LDLCALC 113 (H) 11/12/2022   ALT 26 11/14/2022   AST 38 11/14/2022   NA 136 11/14/2022   K 4.4 11/14/2022   CL 97 11/14/2022   CREATININE 1.00  11/14/2022   BUN 18 11/14/2022   CO2 25 11/14/2022   TSH 3.210 11/12/2022   INR 1.0 05/11/2022   HGBA1C 6.0 (H) 11/12/2022      Assessment & Plan:    Essential (primary) hypertension Assessment & Plan: Bp too low. Overmedicated due to wt loss.  Discontinue hydrochlorothiazide. Continue Valsartan 320 mg daily and Metoprolol XL 12.5 mg daily. Patient to see Dr. Servando Salina in 2-3 weeks. If bp is still low, consider decrease valsartan to 160 mg daily. I would ask that Dr. Servando Salina address next change to medicine if needed.  Continue to work on eating a healthy diet and exercise.  Labs drawn today.    Orders: -     Comprehensive metabolic panel  Gastroesophageal reflux disease without esophagitis Assessment & Plan: Continue Pepcid once daily.   Mixed hyperlipidemia Assessment & Plan: Recommend continue to work on eating healthy diet and exercise. Plan to change Atorvastatin to alternative statin if cholesterol has not improved with weight loss and exercise.  Check lipid.   Orders: -     Lipid panel  Prediabetes -     CBC with Differential/Platelet -     Hemoglobin A1c  Encounter for immunization -     Flu Vaccine Trivalent High Dose (Fluad)  PSVT (paroxysmal supraventricular tachycardia) (HCC) Assessment & Plan: The current medical regimen is effective;  continue present plan and medications. Continue metoprolol XL 12.5 mg daily. Management per specialist.        No orders of the defined types were placed in this encounter.   Orders Placed This Encounter  Procedures   Flu Vaccine Trivalent High Dose (Fluad)   CBC with Differential/Platelet   Comprehensive metabolic panel   Lipid panel   Hemoglobin A1c     Follow-up: Return in about 3 months (around 08/14/2023) for chronic follow up.   I,Katherina A Bramblett,acting as a scribe for Blane Ohara, MD.,have documented all relevant documentation on the behalf of Blane Ohara, MD,as directed by  Blane Ohara, MD while in the  presence of Blane Ohara, MD.   An After Visit Summary was printed and given to the patient.  Blane Ohara, MD [facility] [phone]

## 2023-05-13 NOTE — Assessment & Plan Note (Signed)
Recommend continue to work on eating healthy diet and exercise. Plan to change Atorvastatin to alternative statin.

## 2023-05-13 NOTE — Assessment & Plan Note (Signed)
Well controlled.  No changes to medicines. Valsartan 320 mg daily, HCTZ 12.5 mg daily, Metoprolol 25 mg daily. Continue to work on eating a healthy diet and exercise.  Labs drawn today.

## 2023-05-13 NOTE — Assessment & Plan Note (Signed)
Continue Pepcid once daily. 

## 2023-05-14 ENCOUNTER — Encounter: Payer: Self-pay | Admitting: Family Medicine

## 2023-05-14 ENCOUNTER — Ambulatory Visit (INDEPENDENT_AMBULATORY_CARE_PROVIDER_SITE_OTHER): Payer: PPO | Admitting: Family Medicine

## 2023-05-14 VITALS — BP 120/72 | HR 64 | Temp 96.2°F | Resp 14 | Ht 61.0 in | Wt 109.6 lb

## 2023-05-14 DIAGNOSIS — K219 Gastro-esophageal reflux disease without esophagitis: Secondary | ICD-10-CM | POA: Diagnosis not present

## 2023-05-14 DIAGNOSIS — E782 Mixed hyperlipidemia: Secondary | ICD-10-CM

## 2023-05-14 DIAGNOSIS — R7303 Prediabetes: Secondary | ICD-10-CM

## 2023-05-14 DIAGNOSIS — Z23 Encounter for immunization: Secondary | ICD-10-CM | POA: Diagnosis not present

## 2023-05-14 DIAGNOSIS — I471 Supraventricular tachycardia, unspecified: Secondary | ICD-10-CM

## 2023-05-14 DIAGNOSIS — I10 Essential (primary) hypertension: Secondary | ICD-10-CM

## 2023-05-14 LAB — CBC WITH DIFFERENTIAL/PLATELET
Basophils Absolute: 0 10*3/uL (ref 0.0–0.2)
Basos: 1 %
EOS (ABSOLUTE): 0.2 10*3/uL (ref 0.0–0.4)
Eos: 4 %
Hematocrit: 41.2 % (ref 34.0–46.6)
Hemoglobin: 13.4 g/dL (ref 11.1–15.9)
Immature Grans (Abs): 0 10*3/uL (ref 0.0–0.1)
Immature Granulocytes: 0 %
Lymphocytes Absolute: 1.7 10*3/uL (ref 0.7–3.1)
Lymphs: 41 %
MCH: 29.4 pg (ref 26.6–33.0)
MCHC: 32.5 g/dL (ref 31.5–35.7)
MCV: 90 fL (ref 79–97)
Monocytes Absolute: 0.5 10*3/uL (ref 0.1–0.9)
Monocytes: 13 %
Neutrophils Absolute: 1.7 10*3/uL (ref 1.4–7.0)
Neutrophils: 41 %
Platelets: 233 10*3/uL (ref 150–450)
RBC: 4.56 x10E6/uL (ref 3.77–5.28)
RDW: 12.4 % (ref 11.7–15.4)
WBC: 4.1 10*3/uL (ref 3.4–10.8)

## 2023-05-14 LAB — COMPREHENSIVE METABOLIC PANEL WITH GFR
ALT: 20 IU/L (ref 0–32)
AST: 35 IU/L (ref 0–40)
Albumin: 4.5 g/dL (ref 3.8–4.8)
Alkaline Phosphatase: 28 IU/L — ABNORMAL LOW (ref 44–121)
BUN/Creatinine Ratio: 19 (ref 12–28)
BUN: 14 mg/dL (ref 8–27)
Bilirubin Total: 0.4 mg/dL (ref 0.0–1.2)
CO2: 24 mmol/L (ref 20–29)
Calcium: 10.4 mg/dL — ABNORMAL HIGH (ref 8.7–10.3)
Chloride: 100 mmol/L (ref 96–106)
Creatinine, Ser: 0.75 mg/dL (ref 0.57–1.00)
Globulin, Total: 2.8 g/dL (ref 1.5–4.5)
Glucose: 93 mg/dL (ref 70–99)
Potassium: 4.7 mmol/L (ref 3.5–5.2)
Sodium: 139 mmol/L (ref 134–144)
Total Protein: 7.3 g/dL (ref 6.0–8.5)
eGFR: 85 mL/min/1.73 (ref >59)

## 2023-05-14 LAB — LIPID PANEL
Chol/HDL Ratio: 3.2 {ratio} (ref 0.0–4.4)
Cholesterol, Total: 228 mg/dL — ABNORMAL HIGH (ref 100–199)
HDL: 71 mg/dL (ref >39)
LDL Chol Calc (NIH): 147 mg/dL — ABNORMAL HIGH (ref 0–99)
Triglycerides: 58 mg/dL (ref 0–149)
VLDL Cholesterol Cal: 10 mg/dL (ref 5–40)

## 2023-05-14 LAB — HEMOGLOBIN A1C
Est. average glucose Bld gHb Est-mCnc: 120 mg/dL
Hgb A1c MFr Bld: 5.8 % — ABNORMAL HIGH (ref 4.8–5.6)

## 2023-05-14 NOTE — Assessment & Plan Note (Signed)
The current medical regimen is effective;  continue present plan and medications. Continue metoprolol XL 12.5 mg daily. Management per specialist.

## 2023-05-14 NOTE — Patient Instructions (Signed)
Stop hydrochlorothiazide. Continue toprol xl 12.5 mg daily and valsartan 320 mg once daily.

## 2023-05-20 ENCOUNTER — Other Ambulatory Visit: Payer: Self-pay

## 2023-05-20 MED ORDER — ROSUVASTATIN CALCIUM 5 MG PO TABS: 5.0000 mg | ORAL_TABLET | Freq: Every day | ORAL | 2 refills | Status: DC

## 2023-05-20 NOTE — Telephone Encounter (Signed)
Patient willing to try crestor and co-q-10.

## 2023-05-21 ENCOUNTER — Telehealth: Payer: Self-pay

## 2023-05-21 NOTE — Telephone Encounter (Signed)
Patient: Crystal Oneal  DOB: 03-Aug-1951  MRN: [id number]  Prolia benefits verified by [employer] on: 05/17/23 Coverage Available: Both Buy & Bill and Pharmacy Prior authorization NOT REQUIRED Deductible: does not apply  Benefit Details: Prolia will be subject to a 20% coinsurance up to a $3,200.00 out of pocket max ($519.99 met). Administration will be covered at 100%. No deductible applies.  Patient agrees to going forward with injection.   Jacklynn Bue, LPN

## 2023-05-30 ENCOUNTER — Ambulatory Visit: Payer: PPO | Admitting: Cardiology

## 2023-06-06 ENCOUNTER — Ambulatory Visit: Payer: PPO

## 2023-06-06 DIAGNOSIS — M810 Age-related osteoporosis without current pathological fracture: Secondary | ICD-10-CM | POA: Diagnosis not present

## 2023-06-06 MED ORDER — DENOSUMAB 60 MG/ML ~~LOC~~ SOSY
60.0000 mg | PREFILLED_SYRINGE | Freq: Once | SUBCUTANEOUS | Status: AC
Start: 2023-06-06 — End: 2023-06-06
  Administered 2023-06-06: 60 mg via SUBCUTANEOUS

## 2023-06-06 NOTE — Progress Notes (Signed)
Patient: Crystal Oneal  DOB: 1951/07/20  MRN: [id number]    Visit Date: 06/06/2023    [name] presents today for her six month Prolia injection.  1 x 60 mg Single-Dose Prefilled Syringe was given SQ in the left arm.  Patient tolerated the injection well and has no questions.  The next Prolia injection will be due in six months.      [name], CMA

## 2023-06-25 ENCOUNTER — Ambulatory Visit: Payer: PPO | Admitting: Cardiology

## 2023-08-12 ENCOUNTER — Other Ambulatory Visit: Payer: Self-pay | Admitting: Family Medicine

## 2023-08-20 ENCOUNTER — Ambulatory Visit: Payer: PPO

## 2023-08-20 DIAGNOSIS — Z0000 Encounter for general adult medical examination without abnormal findings: Secondary | ICD-10-CM | POA: Diagnosis not present

## 2023-08-20 NOTE — Patient Instructions (Addendum)
 Crystal Oneal , Thank you for taking time to come for your Medicare Wellness Visit. I appreciate your ongoing commitment to your health goals. Please review the following plan we discussed and let me know if I can assist you in the future.   Referrals/Orders/Follow-Ups/Clinician Recommendations: NONE  This is a list of the screening recommended for you and due dates:  Health Maintenance  Topic Date Due   Zoster (Shingles) Vaccine (1 of 2) Never done   COVID-19 Vaccine (6 - 2024-25 season) 05/13/2024*   Mammogram  02/11/2024   DTaP/Tdap/Td vaccine (2 - Td or Tdap) 08/13/2024   Medicare Annual Wellness Visit  08/19/2024   Colon Cancer Screening  01/18/2027   Flu Shot  Completed   DEXA scan (bone density measurement)  Completed   Hepatitis C Screening  Completed   HPV Vaccine  Aged Out   Pneumonia Vaccine  Discontinued  *Topic was postponed. The date shown is not the original due date.    Advanced directives: (ACP Link)Information on Advanced Care Planning can be found at [place]  Secretary of [facility] Advance Health Care Directives Advance Health Care Directives ([email or web address]   Next Medicare Annual Wellness Visit scheduled for next year: Yes   08/20/24 @ 9:00 AM BY VIDEO

## 2023-08-20 NOTE — Progress Notes (Signed)
 Subjective:   Crystal Oneal is a 73 y.o. female who presents for Medicare Annual (Subsequent) preventive examination.  Visit Complete: Virtual I connected with  Crystal Oneal on 08/20/23 by a video and audio enabled telemedicine application and verified that I am speaking with the correct person using two identifiers.  Patient Location: Home  Provider Location: Office/Clinic  I discussed the limitations of evaluation and management by telemedicine. The patient expressed understanding and agreed to proceed.  Vital Signs: Because this visit was a virtual/telehealth visit, some criteria may be missing or patient reported. Any vitals not documented were not able to be obtained and vitals that have been documented are patient reported.  Cardiac Risk Factors include: advanced age (>21men, >64 women);dyslipidemia;hypertension     Objective:    There were no vitals filed for this visit. There is no height or weight on file to calculate BMI.     08/20/2023    8:51 AM 08/16/2022   10:36 AM 05/11/2022   12:03 PM 05/08/2022   10:44 AM 05/03/2022    7:59 PM 05/18/2021   11:18 AM 05/15/2021    2:13 PM  Advanced Directives  Does Patient Have a Medical Advance Directive? No Yes No No No Yes Yes  Type of Careers Adviser;Living will Healthcare Power of [place];Living will  Does patient want to make changes to medical advance directive?  No - Patient declined    No - Patient declined No - Patient declined  Copy of Healthcare Power of Attorney in Chart?      No - copy requested No - copy requested  Would patient like information on creating a medical advance directive? No - Patient declined  No - Patient declined        Current Medications (verified) Outpatient Encounter Medications as of 08/20/2023  Medication Sig   b complex vitamins tablet Take 1 tablet by mouth every evening.   calcium  citrate-vitamin D  (CITRACAL+D) 315-200 MG-UNIT tablet Take 2 tablets by  mouth 2 (two) times daily.   cetirizine (ZYRTEC) 10 MG tablet Take 5-10 mg by mouth daily as needed for allergies (during spring months).   Cholecalciferol  (VITAMIN D ) 2000 units tablet Take 2,000 Units by mouth in the morning.   denosumab  (PROLIA ) 60 MG/ML SOSY injection Inject 60 mg into the skin every 6 (six) months.   famotidine  (PEPCID ) 40 MG tablet TAKE ONE TABELT BY MOUTH DAILY   fluticasone  (FLONASE ) 50 MCG/ACT nasal spray Place 1 spray into both nostrils daily as needed for allergies or rhinitis.   Ginger, Zingiber officinalis, (GINGER ROOT) 550 MG CAPS Take 1 capsule by mouth in the morning and at bedtime.   metoprolol  succinate (TOPROL -XL) 25 MG 24 hr tablet Take 1 tablet (25 mg total) by mouth daily. (Patient taking differently: Take 12.5 mg by mouth daily.)   Omega-3 Fatty Acids (FISH OIL PO) Take 2,400 mg by mouth in the morning.   rosuvastatin  (CRESTOR ) 5 MG tablet Take 1 tablet (5 mg total) by mouth daily.   TURMERIC CURCUMIN PO Take 1,000 mg by mouth in the morning.   valsartan  (DIOVAN ) 320 MG tablet Take 1 tablet (320 mg total) by mouth daily.   vitamin B-12 (CYANOCOBALAMIN) 1000 MCG tablet Take 1,000 mcg by mouth in the morning.   vitamin C (ASCORBIC ACID) 250 MG tablet Take 250 mg by mouth 2 (two) times daily.   [DISCONTINUED] hydrochlorothiazide  (HYDRODIURIL ) 12.5 MG tablet Take 1 tablet (12.5 mg  total) by mouth daily.   No facility-administered encounter medications on file as of 08/20/2023.    Allergies (verified) Pneumococcal vaccine, Pneumovax 23 [pneumococcal vac polyvalent], Betadine [povidone-iodine], Lipitor [atorvastatin ], Lisinopril, and Sulfa antibiotics   History: Past Medical History:  Diagnosis Date   Age-related osteoporosis without current pathological fracture    Arthritis    Cancer (HCC) 04/2020   SCC skin cancer removed from nose   Cervical stenosis of spine    C11-12   Dysphagia    Esophageal dilation x2   Essential (primary) hypertension     GERD (gastroesophageal reflux disease)    Hypertension    Mixed hyperlipidemia    Paroxysmal SVT (supraventricular tachycardia) (HCC)    PONV (postoperative nausea and vomiting)    Spondylolisthesis of lumbar region    Past Surgical History:  Procedure Laterality Date   ABDOMINAL HYSTERECTOMY     ANTERIOR AND POSTERIOR REPAIR N/A 03/20/2016   Procedure: REPAIR CYSTOCELE AND RECTOCELE;  Surgeon: Glendia Elizabeth, MD;  Location: [facility];  Service: Urology;  Laterality: N/A;   BACK SURGERY  2019   has hardware   BREAST BIOPSY Left 02/23/2022   COLONOSCOPY     ESOPHAGEAL DILATION  2005   x 2   MOHS SURGERY  04/2020   SCC skin cancer removed from nose   OPEN REDUCTION INTERNAL FIXATION (ORIF) DISTAL RADIAL FRACTURE Left 05/11/2022   Procedure: OPEN REDUCTION INTERNAL FIXATION (ORIF) LEFT DISTAL RADIUS FRACTURE;  Surgeon: Murrell Drivers, MD;  Location: [facility];  Service: Orthopedics;  Laterality: Left;  90 MIN   VAGINAL HYSTERECTOMY Bilateral 03/20/2016   Procedure: TOTAL VAGINAL HYSTERECTOMY WITH BILATERAL SALPINGO OOPHORECTOMY mccall coloplasty;  Surgeon: Dickie Carder, MD;  Location: [facility];  Service: Gynecology;  Laterality: Bilateral;   VAGINAL PROLAPSE REPAIR N/A 03/20/2016   Procedure: REPAIR VAULT PROLAPSE AND GRAFT;  Surgeon: Glendia Elizabeth, MD;  Location: [facility];  Service: Urology;  Laterality: N/A;   Family History  Problem Relation Age of Onset   Uterine cancer Mother    Breast cancer Mother    Lung disease Father    Breast cancer Sister    Breast cancer Maternal Aunt    Breast cancer Maternal Aunt    Breast cancer Maternal Aunt    Multiple myeloma Brother    Kidney cancer Brother    Social History   Socioeconomic History   Marital status: Married    Spouse name: Crystal Oneal   Number of children: 2   Years of education: Not on file   Highest education level: Some college, no degree  Occupational History   Occupation: Retired  Tobacco Use    Smoking status: Never   Smokeless tobacco: Never  Vaping Use   Vaping status: Never Used  Substance and Sexual Activity   Alcohol use: Not Currently    Alcohol/week: 1.0 standard drink of alcohol    Types: 1 Glasses of wine per week   Drug use: Never   Sexual activity: Yes    Birth control/protection: Post-menopausal, Surgical    Comment: Hysterectomy  Other Topics Concern   Not on file  Social History Narrative   Not on file   Social Drivers of Health   Financial Resource Strain: Low Risk  (08/20/2023)   Overall Financial Resource Strain (CARDIA)    Difficulty of Paying Living Expenses: Not hard at all  Food Insecurity: No Food Insecurity (08/20/2023)   Hunger Vital Sign    Worried About Running Out of Food in the Last Year:  Never true    Ran Out of Food in the Last Year: Never true  Transportation Needs: No Transportation Needs (08/20/2023)   PRAPARE - Administrator, Civil Service (Medical): No    Lack of Transportation (Non-Medical): No  Physical Activity: Sufficiently Active (08/20/2023)   Exercise Vital Sign    Days of Exercise per Week: 7 days    Minutes of Exercise per Session: 30 min  Stress: No Stress Concern Present (08/20/2023)   [employer] of Occupational Health - Occupational Stress Questionnaire    Feeling of Stress : Only a little  Social Connections: Socially Integrated (08/20/2023)   Social Connection and Isolation Panel [NHANES]    Frequency of Communication with Friends and Family: More than three times a week    Frequency of Social Gatherings with Friends and Family: Once a week    Attends Religious Services: More than 4 times per year    Active Member of [employer] or Organizations: Yes    Attends Engineer, Structural: More than 4 times per year    Marital Status: Married    Tobacco Counseling Counseling given: Not Answered   Clinical Intake:  Pre-visit preparation completed: Yes  Pain : No/denies pain     BMI - recorded:  20.6 Nutritional Status: BMI of 19-24  Normal Nutritional Risks: None Diabetes: No  How often do you need to have someone help you when you read instructions, pamphlets, or other written materials from your doctor or pharmacy?: 1 - Never  Interpreter Needed?: No  Information entered by :: JHONNIE DAS, LPN   Activities of Daily Living    08/20/2023    8:52 AM 08/15/2023    3:04 PM  In your present state of health, do you have any difficulty performing the following activities:  Hearing? 0 0  Vision? 0 0  Difficulty concentrating or making decisions? 0 0  Walking or climbing stairs? 0 0  Dressing or bathing? 0 0  Doing errands, shopping? 0 0  Preparing Food and eating ? N N  Using the Toilet? N N  In the past six months, have you accidently leaked urine? N Y  Do you have problems with loss of bowel control? N N  Managing your Medications? N N  Managing your Finances? N N  Housekeeping or managing your Housekeeping? N N    Patient Care Team: CoxAbigail, MD as PCP - General (Family Medicine) Tobb, Kardie, DO as PCP - Cardiology (Cardiology) Towana Charleston, MD (Gastroenterology) Mavis Purchase, MD as Consulting Physician (Neurosurgery)  Indicate any recent Medical Services you may have received from other than [facility] providers in the past year (date may be approximate).     Assessment:   This is a routine wellness examination for [place].  Hearing/Vision screen Hearing Screening - Comments:: NO AIDS Vision Screening - Comments:: WEARS GLASSES MOST OF TIME- [facility] IN [place]   Goals Addressed             This Visit's Progress    DIET - EAT MORE FRUITS AND VEGETABLES         Depression Screen    08/20/2023    8:49 AM 05/14/2023    8:45 AM 11/12/2022    8:39 AM 08/16/2022   10:34 AM 07/02/2022    9:22 AM 05/15/2021    2:14 PM 05/08/2021    7:59 AM  PHQ 2/9 Scores  PHQ - 2 Score 0 0 0 0 0 0 0  PHQ- 9 Score  0 3         Fall Risk    08/20/2023    8:52 AM  08/15/2023    3:04 PM 05/14/2023    8:45 AM 11/12/2022    8:38 AM 08/16/2022   10:38 AM  Fall Risk   Falls in the past year? 0 0 0 0 1  Number falls in past yr: 0  0 1 0  Injury with Fall? 0  0 0 1  Risk for fall due to : No Fall Risks  No Fall Risks No Fall Risks No Fall Risks  Follow up Falls prevention discussed;Falls evaluation completed  Falls evaluation completed;Falls prevention discussed Falls evaluation completed;Falls prevention discussed Falls evaluation completed;Education provided    MEDICARE RISK AT HOME: Medicare Risk at Home Any stairs in or around the home?: Yes If so, are there any without handrails?: No Home free of loose throw rugs in walkways, pet beds, electrical cords, etc?: Yes Adequate lighting in your home to reduce risk of falls?: Yes Life alert?: No Use of a cane, walker or w/c?: No Grab bars in the bathroom?: No Shower chair or bench in shower?: No Elevated toilet seat or a handicapped toilet?: No  TIMED UP AND GO:  Was the test performed?  No    Cognitive Function:        08/20/2023    8:53 AM 08/16/2022   10:39 AM 05/15/2021    2:15 PM  6CIT Screen  What Year? 0 points 0 points 0 points  What month? 0 points 0 points 0 points  What time? 0 points 0 points 0 points  Count back from 20 0 points 0 points 0 points  Months in reverse 0 points 0 points 0 points  Repeat phrase 0 points 0 points 0 points  Total Score 0 points 0 points 0 points    Immunizations Immunization History  Administered Date(s) Administered   Fluad Quad(high Dose 65+) 06/16/2020, 05/08/2021, 07/18/2022   Fluad Trivalent(High Dose 65+) 05/14/2023   Influenza-Unspecified 04/26/2017, 06/05/2019   Moderna Covid-19 Vaccine Bivalent Booster 90yrs & up 11/06/2021   Moderna SARS-COV2 Booster Vaccination 12/28/2020   Moderna Sars-Covid-2 Vaccination 09/11/2019, 10/07/2019, 06/06/2020   Pneumococcal Polysaccharide-23 05/29/2013   Tdap 08/13/2014    TDAP status: Up to date  Flu  Vaccine status: Up to date  Pneumococcal vaccine status: Declined,  Education has been provided regarding the importance of this vaccine but patient still declined. Advised may receive this vaccine at local pharmacy or Health Dept. Aware to provide a copy of the vaccination record if obtained from local pharmacy or Health Dept. Verbalized acceptance and understanding.   Covid-19 vaccine status: Completed vaccines  Qualifies for Shingles Vaccine? Yes   Zostavax completed No   Shingrix Completed?: No.    Education has been provided regarding the importance of this vaccine. Patient has been advised to call insurance company to determine out of pocket expense if they have not yet received this vaccine. Advised may also receive vaccine at local pharmacy or Health Dept. Verbalized acceptance and understanding.  Screening Tests Health Maintenance  Topic Date Due   Zoster Vaccines- Shingrix (1 of 2) Never done   COVID-19 Vaccine (6 - 2024-25 season) 05/13/2024 (Originally 04/14/2023)   MAMMOGRAM  02/11/2024   DTaP/Tdap/Td (2 - Td or Tdap) 08/13/2024   Medicare Annual Wellness (AWV)  08/19/2024   Colonoscopy  01/18/2027   INFLUENZA VACCINE  Completed   DEXA SCAN  Completed   Hepatitis C Screening  Completed  HPV VACCINES  Aged Out   Pneumonia Vaccine 64+ Years old  Discontinued    Health Maintenance  Health Maintenance Due  Topic Date Due   Zoster Vaccines- Shingrix (1 of 2) Never done    Colorectal cancer screening: Type of screening: Colonoscopy. Completed 01/17/22. Repeat every 5 years  Mammogram status: Completed 02/11/23. Repeat every year  Bone Density status: Completed 05/01/22. Results reflect: Bone density results: OSTEOPOROSIS. Repeat every 2 years.  Lung Cancer Screening: (Low Dose CT Chest recommended if Age 44-80 years, 20 pack-year currently smoking OR have quit w/in 15years.) does not qualify.    Additional Screening:  Hepatitis C Screening: does qualify; Completed  05/08/21  Vision Screening: Recommended annual ophthalmology exams for early detection of glaucoma and other disorders of the eye. Is the patient up to date with their annual eye exam?  Yes  Who is the provider or what is the name of the office in which the patient attends annual eye exams? [facility] [place] If pt is not established with a provider, would they like to be referred to a provider to establish care? No .   Dental Screening: Recommended annual dental exams for proper oral hygiene   Community Resource Referral / Chronic Care Management: CRR required this visit?  No   CCM required this visit?  No     Plan:     I have personally reviewed and noted the following in the patient's chart:   Medical and social history Use of alcohol, tobacco or illicit drugs  Current medications and supplements including opioid prescriptions. Patient is not currently taking opioid prescriptions. Functional ability and status Nutritional status Physical activity Advanced directives List of other physicians Hospitalizations, surgeries, and ER visits in previous 12 months Vitals Screenings to include cognitive, depression, and falls Referrals and appointments  In addition, I have reviewed and discussed with patient certain preventive protocols, quality metrics, and best practice recommendations. A written personalized care plan for preventive services as well as general preventive health recommendations were provided to patient.     Jhonnie GORMAN Das, LPN   03/14/7973   After Visit Summary: (MyChart) Due to this being a telephonic visit, the after visit summary with patients personalized plan was offered to patient via MyChart   Nurse Notes: NONE

## 2023-09-01 NOTE — Progress Notes (Unsigned)
Subjective:  Patient ID: [name], female    DOB: [birth year]  Age: 73 y.o. MRN: [id number]  Chief Complaint  Patient presents with   Medical Management of Chronic Issues    HPI History of Present Illness The patient, with a history of osteoporosis, supraventricular tachycardia (SVT), hypertension, hyperlipidemia, and allergies, presents for a routine follow-up. She reports no new issues, but does note intermittent insomnia. She describes difficulty both falling asleep and staying asleep, but is usually able to return to sleep after waking. She feels weak in the mornings, but improves as the day progresses. She also reports occasional back pain, which is brief and typically related to overexertion. She continues to manage her acid reflux with famotidine and ginger, and her allergies with Zyrtec, even during the winter months. She is compliant with her Prolia injections for osteoporosis, and her metoprolol, valsartan, and rosuvastatin for her cardiovascular conditions (hypertension and SVT.) She is scheduled to follow up with Dr Servando Salina (cardiology) soon. She also takes fish oil and various vitamins and supplements. She has been maintaining an exercise routine, even in cold weather.      08/20/2023    8:49 AM 05/14/2023    8:45 AM 11/12/2022    8:39 AM 08/16/2022   10:34 AM 07/02/2022    9:22 AM  Depression screen PHQ 2/9  Decreased Interest 0 0 0 0 0  Down, Depressed, Hopeless 0 0 0 0 0  PHQ - 2 Score 0 0 0 0 0  Altered sleeping 0 3     Tired, decreased energy 0 0     Change in appetite 0 0     Feeling bad or failure about yourself  0 0     Trouble concentrating 0 0     Moving slowly or fidgety/restless 0 0     Suicidal thoughts 0 0     PHQ-9 Score 0 3     Difficult doing work/chores Not difficult at all Not difficult at all           08/20/2023    8:52 AM  Fall Risk   Falls in the past year? 0  Number falls in past yr: 0  Injury with Fall? 0  Risk for fall due to : No Fall Risks   Follow up Falls prevention discussed;Falls evaluation completed    Patient Care Team: Blane Ohara, MD as PCP - General (Family Medicine) Thomasene Ripple, DO as PCP - Cardiology (Cardiology) Webb Silversmith, MD (Gastroenterology) Tressie Stalker, MD as Consulting Physician (Neurosurgery)   Review of Systems  Constitutional:  Negative for chills, fatigue and fever.  HENT:  Negative for congestion, ear pain, rhinorrhea and sore throat.   Respiratory:  Negative for cough and shortness of breath.   Cardiovascular:  Negative for chest pain.  Gastrointestinal:  Negative for abdominal pain, constipation, diarrhea, nausea and vomiting.  Genitourinary:  Negative for dysuria and urgency.  Musculoskeletal:  Negative for back pain and myalgias.  Neurological:  Negative for dizziness, weakness, light-headedness and headaches.  Psychiatric/Behavioral:  Negative for dysphoric mood. The patient is not nervous/anxious.     Current Outpatient Medications on File Prior to Visit  Medication Sig Dispense Refill   b complex vitamins tablet Take 1 tablet by mouth every evening.     calcium citrate-vitamin D (CITRACAL+D) 315-200 MG-UNIT tablet Take 2 tablets by mouth 2 (two) times daily.     cetirizine (ZYRTEC) 10 MG tablet Take 5-10 mg by mouth daily as needed for allergies (during  spring months).     Cholecalciferol (VITAMIN D) 2000 units tablet Take 2,000 Units by mouth in the morning.     denosumab (PROLIA) 60 MG/ML SOSY injection Inject 60 mg into the skin every 6 (six) months.     famotidine (PEPCID) 40 MG tablet TAKE ONE TABELT BY MOUTH DAILY 90 tablet 1   fluticasone (FLONASE) 50 MCG/ACT nasal spray Place 1 spray into both nostrils daily as needed for allergies or rhinitis.     Ginger, Zingiber officinalis, (GINGER ROOT) 550 MG CAPS Take 1 capsule by mouth in the morning and at bedtime.     Omega-3 Fatty Acids (FISH OIL PO) Take 2,400 mg by mouth in the morning.     rosuvastatin (CRESTOR) 5 MG tablet  Take 1 tablet (5 mg total) by mouth daily. 30 tablet 0   TURMERIC CURCUMIN PO Take 1,000 mg by mouth in the morning.     valsartan (DIOVAN) 320 MG tablet Take 1 tablet (320 mg total) by mouth daily. 90 tablet 1   vitamin B-12 (CYANOCOBALAMIN) 1000 MCG tablet Take 1,000 mcg by mouth in the morning.     vitamin C (ASCORBIC ACID) 250 MG tablet Take 250 mg by mouth 2 (two) times daily.     No current facility-administered medications on file prior to visit.   Past Medical History:  Diagnosis Date   Age-related osteoporosis without current pathological fracture    Arthritis    Cancer (HCC) 04/2020   SCC skin cancer removed from nose   Cervical stenosis of spine    C11-12   Dysphagia    Esophageal dilation x2   Essential (primary) hypertension    GERD (gastroesophageal reflux disease)    Hypertension    Mixed hyperlipidemia    Paroxysmal SVT (supraventricular tachycardia) (HCC)    PONV (postoperative nausea and vomiting)    Spondylolisthesis of lumbar region    Past Surgical History:  Procedure Laterality Date   ABDOMINAL HYSTERECTOMY     ANTERIOR AND POSTERIOR REPAIR N/A 03/20/2016   Procedure: REPAIR CYSTOCELE AND RECTOCELE;  Surgeon: Alfredo Martinez, MD;  Location: [facility];  Service: Urology;  Laterality: N/A;   BACK SURGERY  2019   has hardware   BREAST BIOPSY Left 02/23/2022   COLONOSCOPY     ESOPHAGEAL DILATION  2005   x 2   MOHS SURGERY  04/2020   SCC skin cancer removed from nose   OPEN REDUCTION INTERNAL FIXATION (ORIF) DISTAL RADIAL FRACTURE Left 05/11/2022   Procedure: OPEN REDUCTION INTERNAL FIXATION (ORIF) LEFT DISTAL RADIUS FRACTURE;  Surgeon: Betha Loa, MD;  Location: [facility];  Service: Orthopedics;  Laterality: Left;  90 MIN   VAGINAL HYSTERECTOMY Bilateral 03/20/2016   Procedure: TOTAL VAGINAL HYSTERECTOMY WITH BILATERAL SALPINGO OOPHORECTOMY mccall coloplasty;  Surgeon: Maxie Better, MD;  Location: [facility];  Service: Gynecology;   Laterality: Bilateral;   VAGINAL PROLAPSE REPAIR N/A 03/20/2016   Procedure: REPAIR VAULT PROLAPSE AND GRAFT;  Surgeon: Alfredo Martinez, MD;  Location: [facility];  Service: Urology;  Laterality: N/A;    Family History  Problem Relation Age of Onset   Uterine cancer Mother    Breast cancer Mother    Lung disease Father    Breast cancer Sister    Breast cancer Maternal Aunt    Breast cancer Maternal Aunt    Breast cancer Maternal Aunt    Multiple myeloma Brother    Kidney cancer Brother    Social History   Socioeconomic History   Marital  status: Married    Spouse name: Jake Shark   Number of children: 2   Years of education: Not on file   Highest education level: Professional school degree (e.g., MD, DDS, DVM, JD)  Occupational History   Occupation: Retired  Tobacco Use   Smoking status: Never   Smokeless tobacco: Never  Vaping Use   Vaping status: Never Used  Substance and Sexual Activity   Alcohol use: Not Currently    Alcohol/week: 1.0 standard drink of alcohol    Types: 1 Glasses of wine per week   Drug use: Never   Sexual activity: Yes    Birth control/protection: Post-menopausal, Surgical    Comment: Hysterectomy  Other Topics Concern   Not on file  Social History Narrative   Not on file   Social Drivers of Health   Financial Resource Strain: Low Risk  (09/01/2023)   Overall Financial Resource Strain (CARDIA)    Difficulty of Paying Living Expenses: Not hard at all  Food Insecurity: No Food Insecurity (09/01/2023)   Hunger Vital Sign    Worried About Running Out of Food in the Last Year: Never true    Ran Out of Food in the Last Year: Never true  Transportation Needs: No Transportation Needs (09/01/2023)   PRAPARE - Administrator, Civil Service (Medical): No    Lack of Transportation (Non-Medical): No  Physical Activity: Sufficiently Active (09/01/2023)   Exercise Vital Sign    Days of Exercise per Week: 6 days    Minutes of Exercise per Session: 30  min  Stress: No Stress Concern Present (09/01/2023)   [employer] of Occupational Health - Occupational Stress Questionnaire    Feeling of Stress : Not at all  Social Connections: Socially Integrated (09/01/2023)   Social Connection and Isolation Panel [NHANES]    Frequency of Communication with Friends and Family: More than three times a week    Frequency of Social Gatherings with Friends and Family: Twice a week    Attends Religious Services: More than 4 times per year    Active Member of [employer] or Organizations: Yes    Attends Engineer, structural: More than 4 times per year    Marital Status: Married    Objective:  BP 130/68   Pulse 71   Temp 97.6 F (36.4 C)   Ht 5\' 1"  (1.549 m)   Wt 111 lb (50.3 kg)   SpO2 100%   BMI 20.97 kg/m      09/02/2023    9:09 AM 05/14/2023    8:40 AM 11/12/2022    8:34 AM  BP/Weight  Systolic BP 130 120 110  Diastolic BP 68 72 62  Wt. (Lbs) 111 109.6 120  BMI 20.97 kg/m2 20.71 kg/m2 22.67 kg/m2    Physical Exam Vitals reviewed.  Constitutional:      Appearance: Normal appearance. She is normal weight.  Neck:     Vascular: No carotid bruit.  Cardiovascular:     Rate and Rhythm: Normal rate and regular rhythm.     Heart sounds: Normal heart sounds.  Pulmonary:     Effort: Pulmonary effort is normal. No respiratory distress.     Breath sounds: Normal breath sounds.  Abdominal:     General: Abdomen is flat. Bowel sounds are normal.     Palpations: Abdomen is soft.     Tenderness: There is no abdominal tenderness.  Neurological:     Mental Status: She is alert and oriented to person, place, and  time.  Psychiatric:        Mood and Affect: Mood normal.        Behavior: Behavior normal.     Diabetic Foot Exam - Simple   No data filed      Lab Results  Component Value Date   WBC 4.1 05/14/2023   HGB 13.4 05/14/2023   HCT 41.2 05/14/2023   PLT 233 05/14/2023   GLUCOSE 93 05/14/2023   CHOL 228 (H) 05/14/2023   TRIG  58 05/14/2023   HDL 71 05/14/2023   LDLCALC 147 (H) 05/14/2023   ALT 20 05/14/2023   AST 35 05/14/2023   NA 139 05/14/2023   K 4.7 05/14/2023   CL 100 05/14/2023   CREATININE 0.75 05/14/2023   BUN 14 05/14/2023   CO2 24 05/14/2023   TSH 3.210 11/12/2022   INR 1.0 05/11/2022   HGBA1C 5.8 (H) 05/14/2023      Assessment & Plan:    Essential (primary) hypertension Assessment & Plan: Bp too low. Overmedicated due to wt loss.  Discontinue hydrochlorothiazide. Continue Valsartan 320 mg daily and Metoprolol XL 25 mg daily. Continue to work on eating a healthy diet and exercise.  Labs drawn today.    Orders: -     CBC with Differential/Platelet -     Comprehensive metabolic panel -     Metoprolol Succinate ER; Take 1 tablet (25 mg total) by mouth daily.  Dispense: 90 tablet; Refill: 1  Gastroesophageal reflux disease without esophagitis Assessment & Plan: Controlled with famotidine and ginger. -Continue famotidine daily and ginger as needed.   Mixed hyperlipidemia Assessment & Plan: Await labs/testing for assessment and recommendations. Currently on crestor 5 mg before bed, which was started 3 months ago. Recommend continue to work on eating healthy diet and exercise.   Orders: -     Lipid panel  Prediabetes -     Hemoglobin A1c  PSVT (paroxysmal supraventricular tachycardia) (HCC) Assessment & Plan: Continue toprol xl 25 mg daily.  Management per specialist.     Primary insomnia Assessment & Plan: Patient reports difficulty staying asleep and occasional difficulty falling asleep. Discussed sleep hygiene and potential use of magnesium glycinate. -Consider trial of magnesium glycinate for sleep. -Continue good sleep hygiene practices, including limiting caffeine intake and maintaining a quiet, dark sleep environment.      Meds ordered this encounter  Medications   metoprolol succinate (TOPROL-XL) 25 MG 24 hr tablet    Sig: Take 1 tablet (25 mg total) by  mouth daily.    Dispense:  90 tablet    Refill:  1    Orders Placed This Encounter  Procedures   CBC with Differential/Platelet   Comprehensive metabolic panel   Hemoglobin A1c   Lipid panel     Follow-up: Return in about 3 months (around 12/01/2023) for chronic follow up.   I,Marla I Leal-Borjas,acting as a scribe for Blane Ohara, MD.,have documented all relevant documentation on the behalf of Blane Ohara, MD,as directed by  Blane Ohara, MD while in the presence of Blane Ohara, MD.   An After Visit Summary was printed and given to the patient.  I attest that I have reviewed this visit and agree with the plan scribed by my staff.   Blane Ohara, MD [facility] [phone]

## 2023-09-02 ENCOUNTER — Ambulatory Visit (INDEPENDENT_AMBULATORY_CARE_PROVIDER_SITE_OTHER): Payer: PPO | Admitting: Family Medicine

## 2023-09-02 ENCOUNTER — Encounter: Payer: Self-pay | Admitting: Family Medicine

## 2023-09-02 VITALS — BP 130/68 | HR 71 | Temp 97.6°F | Ht 61.0 in | Wt 111.0 lb

## 2023-09-02 DIAGNOSIS — F5101 Primary insomnia: Secondary | ICD-10-CM | POA: Insufficient documentation

## 2023-09-02 DIAGNOSIS — I471 Supraventricular tachycardia, unspecified: Secondary | ICD-10-CM | POA: Diagnosis not present

## 2023-09-02 DIAGNOSIS — R7303 Prediabetes: Secondary | ICD-10-CM

## 2023-09-02 DIAGNOSIS — I10 Essential (primary) hypertension: Secondary | ICD-10-CM

## 2023-09-02 DIAGNOSIS — K219 Gastro-esophageal reflux disease without esophagitis: Secondary | ICD-10-CM | POA: Diagnosis not present

## 2023-09-02 DIAGNOSIS — E782 Mixed hyperlipidemia: Secondary | ICD-10-CM | POA: Diagnosis not present

## 2023-09-02 MED ORDER — METOPROLOL SUCCINATE ER 25 MG PO TB24: 25.0000 mg | ORAL_TABLET | Freq: Every day | ORAL | 1 refills | Status: DC

## 2023-09-02 NOTE — Assessment & Plan Note (Signed)
Patient reports difficulty staying asleep and occasional difficulty falling asleep. Discussed sleep hygiene and potential use of magnesium glycinate. -Consider trial of magnesium glycinate for sleep. -Continue good sleep hygiene practices, including limiting caffeine intake and maintaining a quiet, dark sleep environment.

## 2023-09-02 NOTE — Assessment & Plan Note (Signed)
Controlled with famotidine and ginger. -Continue famotidine daily and ginger as needed.

## 2023-09-02 NOTE — Assessment & Plan Note (Signed)
Continue toprol xl 25 mg daily.  Management per specialist.

## 2023-09-02 NOTE — Assessment & Plan Note (Signed)
Await labs/testing for assessment and recommendations. Currently on crestor 5 mg before bed, which was started 3 months ago. Recommend continue to work on eating healthy diet and exercise.

## 2023-09-02 NOTE — Patient Instructions (Signed)
VISIT SUMMARY:  During your routine follow-up visit, we discussed your ongoing management of osteoporosis, supraventricular tachycardia (SVT), hypertension, hyperlipidemia, allergies, and new concerns about intermittent insomnia and occasional back pain. You are maintaining your current treatments and lifestyle habits well.  YOUR PLAN:  -OSTEOPOROSIS: Osteoporosis is a condition where bones become weak and brittle. You are stable on Prolia injections every six months, so please continue with this treatment as scheduled.  -GASTROESOPHAGEAL REFLUX DISEASE (GERD): GERD is a digestive disorder where stomach acid frequently flows back into the esophagus. Your condition is controlled with famotidine and ginger, so continue using these as needed.  -SUPRAVENTRICULAR TACHYCARDIA (SVT) AND HYPERTENSION: SVT is a rapid heart rate originating above the heart's ventricles, and hypertension is high blood pressure. Continue taking metoprolol at the increased dose and valsartan 320mg  daily. Please monitor your blood pressure and heart rate regularly, and try to stay calm and relaxed when taking readings.  -HYPERLIPIDEMIA: Hyperlipidemia is having high levels of fats in the blood. Await labs/testing for assessment and recommendations. Continue crestor 5 mg before bed for now.  -INSOMNIA: Insomnia is difficulty falling or staying asleep. We discussed sleep hygiene practices and the potential use of magnesium glycinate. Consider trying magnesium glycinate and continue practicing good sleep habits, such as limiting caffeine and keeping your sleep environment quiet and dark.  -GENERAL HEALTH MAINTENANCE: You are on a regimen of vitamins and supplements, including vitamin C, B12, turmeric, ginger, vitamin D, calcium with vitamin D, B complex, and fish oil. Continue with your current regimen.  INSTRUCTIONS:  Please continue with your current medications and lifestyle habits as discussed. Monitor your blood pressure and  heart rate regularly, and consider trying magnesium glycinate for your insomnia. If you have any new symptoms or concerns, please schedule a follow-up appointment.

## 2023-09-02 NOTE — Assessment & Plan Note (Signed)
Bp too low. Overmedicated due to wt loss.  Discontinue hydrochlorothiazide. Continue Valsartan 320 mg daily and Metoprolol XL 25 mg daily. Continue to work on eating a healthy diet and exercise.  Labs drawn today.

## 2023-09-03 ENCOUNTER — Encounter: Payer: Self-pay | Admitting: Family Medicine

## 2023-09-03 LAB — CBC WITH DIFFERENTIAL/PLATELET
Basophils Absolute: 0 10*3/uL (ref 0.0–0.2)
Basos: 1 %
EOS (ABSOLUTE): 0.2 10*3/uL (ref 0.0–0.4)
Eos: 6 %
Hematocrit: 38.7 % (ref 34.0–46.6)
Hemoglobin: 12.7 g/dL (ref 11.1–15.9)
Immature Grans (Abs): 0 10*3/uL (ref 0.0–0.1)
Immature Granulocytes: 0 %
Lymphocytes Absolute: 1.8 10*3/uL (ref 0.7–3.1)
Lymphs: 47 %
MCH: 28.9 pg (ref 26.6–33.0)
MCHC: 32.8 g/dL (ref 31.5–35.7)
MCV: 88 fL (ref 79–97)
Monocytes Absolute: 0.6 10*3/uL (ref 0.1–0.9)
Monocytes: 16 %
Neutrophils Absolute: 1.1 10*3/uL — ABNORMAL LOW (ref 1.4–7.0)
Neutrophils: 30 %
Platelets: 203 10*3/uL (ref 150–450)
RBC: 4.39 x10E6/uL (ref 3.77–5.28)
RDW: 11.6 % — ABNORMAL LOW (ref 11.7–15.4)
WBC: 3.8 10*3/uL (ref 3.4–10.8)

## 2023-09-03 LAB — COMPREHENSIVE METABOLIC PANEL
ALT: 24 [IU]/L (ref 0–32)
AST: 32 [IU]/L (ref 0–40)
Albumin: 4.4 g/dL (ref 3.8–4.8)
Alkaline Phosphatase: 33 [IU]/L — ABNORMAL LOW (ref 44–121)
BUN/Creatinine Ratio: 17 (ref 12–28)
BUN: 13 mg/dL (ref 8–27)
Bilirubin Total: 0.4 mg/dL (ref 0.0–1.2)
CO2: 25 mmol/L (ref 20–29)
Calcium: 9.9 mg/dL (ref 8.7–10.3)
Chloride: 105 mmol/L (ref 96–106)
Creatinine, Ser: 0.78 mg/dL (ref 0.57–1.00)
Globulin, Total: 2.7 g/dL (ref 1.5–4.5)
Glucose: 100 mg/dL — ABNORMAL HIGH (ref 70–99)
Potassium: 5.3 mmol/L — ABNORMAL HIGH (ref 3.5–5.2)
Sodium: 140 mmol/L (ref 134–144)
Total Protein: 7.1 g/dL (ref 6.0–8.5)
eGFR: 81 mL/min/{1.73_m2} (ref >59)

## 2023-09-03 LAB — LIPID PANEL
Chol/HDL Ratio: 2.1 {ratio} (ref 0.0–4.4)
Cholesterol, Total: 161 mg/dL (ref 100–199)
HDL: 78 mg/dL (ref >39)
LDL Chol Calc (NIH): 74 mg/dL (ref 0–99)
Triglycerides: 37 mg/dL (ref 0–149)
VLDL Cholesterol Cal: 9 mg/dL (ref 5–40)

## 2023-09-03 LAB — HEMOGLOBIN A1C
Est. average glucose Bld gHb Est-mCnc: 126 mg/dL
Hgb A1c MFr Bld: 6 % — ABNORMAL HIGH (ref 4.8–5.6)

## 2023-09-05 ENCOUNTER — Encounter: Payer: Self-pay | Admitting: Cardiology

## 2023-09-05 ENCOUNTER — Ambulatory Visit: Payer: PPO | Attending: Cardiology | Admitting: Cardiology

## 2023-09-05 VITALS — BP 176/84 | HR 68 | Ht 61.0 in | Wt 111.2 lb

## 2023-09-05 DIAGNOSIS — E559 Vitamin D deficiency, unspecified: Secondary | ICD-10-CM

## 2023-09-05 DIAGNOSIS — I471 Supraventricular tachycardia, unspecified: Secondary | ICD-10-CM | POA: Diagnosis not present

## 2023-09-05 DIAGNOSIS — I10 Essential (primary) hypertension: Secondary | ICD-10-CM

## 2023-09-05 DIAGNOSIS — R7303 Prediabetes: Secondary | ICD-10-CM

## 2023-09-05 LAB — VITAMIN D 25 HYDROXY (VIT D DEFICIENCY, FRACTURES): Vit D, 25-Hydroxy: 98.9 ng/mL (ref 30.0–100.0)

## 2023-09-05 NOTE — Patient Instructions (Signed)
Medication Instructions:  Your physician recommends that you continue on your current medications as directed. Please refer to the Current Medication list given to you today.  *If you need a refill on your cardiac medications before your next appointment, please call your pharmacy*   Lab Work: Vit D If you have labs (blood work) drawn today and your tests are completely normal, you will receive your results only by: MyChart Message (if you have MyChart) OR A paper copy in the mail If you have any lab test that is abnormal or we need to change your treatment, we will call you to review the results.  Follow-Up: At [facility], you and your health needs are our priority.  As part of our continuing mission to provide you with exceptional heart care, we have created designated Provider Care Teams.  These Care Teams include your primary Cardiologist (physician) and Advanced Practice Providers (APPs -  Physician Assistants and Nurse Practitioners) who all work together to provide you with the care you need, when you need it.  Your next appointment:   1 year(s)  Provider:   Thomasene Ripple, DO     Other Instructions:

## 2023-09-05 NOTE — Progress Notes (Signed)
Cardiology Office Note:    Date:  09/05/2023   ID:  Crystal Oneal, DOB 08-Mar-1951, MRN [id number]  PCP:  Blane Ohara, MD  Cardiologist:  Thomasene Ripple, DO  Electrophysiologist:  None   Referring MD: Blane Ohara, MD   No chief complaint on file.  I am doing fine  History of Present Illness:    Crystal Oneal is a 73 y.o. female with a hx of hypertension, hyperlipidemia, paroxysmal SVT is here today for follow-up visit.  She recently got diagnosed with prediabetes.  She offers no complaints at this time.  Past Medical History:  Diagnosis Date   Age-related osteoporosis without current pathological fracture    Arthritis    Cancer (HCC) 04/2020   SCC skin cancer removed from nose   Cervical stenosis of spine    C11-12   Dysphagia    Esophageal dilation x2   Essential (primary) hypertension    GERD (gastroesophageal reflux disease)    Hypertension    Mixed hyperlipidemia    Paroxysmal SVT (supraventricular tachycardia) (HCC)    PONV (postoperative nausea and vomiting)    Spondylolisthesis of lumbar region     Past Surgical History:  Procedure Laterality Date   ABDOMINAL HYSTERECTOMY     ANTERIOR AND POSTERIOR REPAIR N/A 03/20/2016   Procedure: REPAIR CYSTOCELE AND RECTOCELE;  Surgeon: Alfredo Martinez, MD;  Location: [facility];  Service: Urology;  Laterality: N/A;   BACK SURGERY  2019   has hardware   BREAST BIOPSY Left 02/23/2022   COLONOSCOPY     ESOPHAGEAL DILATION  2005   x 2   MOHS SURGERY  04/2020   SCC skin cancer removed from nose   OPEN REDUCTION INTERNAL FIXATION (ORIF) DISTAL RADIAL FRACTURE Left 05/11/2022   Procedure: OPEN REDUCTION INTERNAL FIXATION (ORIF) LEFT DISTAL RADIUS FRACTURE;  Surgeon: Betha Loa, MD;  Location: [facility];  Service: Orthopedics;  Laterality: Left;  90 MIN   VAGINAL HYSTERECTOMY Bilateral 03/20/2016   Procedure: TOTAL VAGINAL HYSTERECTOMY WITH BILATERAL SALPINGO OOPHORECTOMY mccall coloplasty;  Surgeon:  Maxie Better, MD;  Location: [facility];  Service: Gynecology;  Laterality: Bilateral;   VAGINAL PROLAPSE REPAIR N/A 03/20/2016   Procedure: REPAIR VAULT PROLAPSE AND GRAFT;  Surgeon: Alfredo Martinez, MD;  Location: [facility];  Service: Urology;  Laterality: N/A;    Current Medications: Current Meds  Medication Sig   b complex vitamins tablet Take 1 tablet by mouth every evening.   calcium citrate-vitamin D (CITRACAL+D) 315-200 MG-UNIT tablet Take 2 tablets by mouth 2 (two) times daily.   cetirizine (ZYRTEC) 10 MG tablet Take 5-10 mg by mouth daily as needed for allergies (during spring months).   Cholecalciferol (VITAMIN D) 2000 units tablet Take 2,000 Units by mouth in the morning.   denosumab (PROLIA) 60 MG/ML SOSY injection Inject 60 mg into the skin every 6 (six) months.   famotidine (PEPCID) 40 MG tablet TAKE ONE TABELT BY MOUTH DAILY   fluticasone (FLONASE) 50 MCG/ACT nasal spray Place 1 spray into both nostrils daily as needed for allergies or rhinitis.   Ginger, Zingiber officinalis, (GINGER ROOT) 550 MG CAPS Take 1 capsule by mouth in the morning and at bedtime.   metoprolol succinate (TOPROL-XL) 25 MG 24 hr tablet Take 1 tablet (25 mg total) by mouth daily.   Omega-3 Fatty Acids (FISH OIL PO) Take 2,400 mg by mouth in the morning.   rosuvastatin (CRESTOR) 5 MG tablet Take 1 tablet (5 mg total) by mouth daily.   TURMERIC  CURCUMIN PO Take 1,000 mg by mouth in the morning.   valsartan (DIOVAN) 320 MG tablet Take 1 tablet (320 mg total) by mouth daily.   vitamin B-12 (CYANOCOBALAMIN) 1000 MCG tablet Take 1,000 mcg by mouth in the morning.   vitamin C (ASCORBIC ACID) 250 MG tablet Take 250 mg by mouth 2 (two) times daily.     Allergies:   Pneumococcal vaccine, Pneumovax 23 [pneumococcal vac polyvalent], Betadine [povidone-iodine], Lipitor [atorvastatin], Lisinopril, and Sulfa antibiotics   Social History   Socioeconomic History   Marital status: Married    Spouse name: Jake Shark    Number of children: 2   Years of education: Not on file   Highest education level: Professional school degree (e.g., MD, DDS, DVM, JD)  Occupational History   Occupation: Retired  Tobacco Use   Smoking status: Never   Smokeless tobacco: Never  Vaping Use   Vaping status: Never Used  Substance and Sexual Activity   Alcohol use: Not Currently    Alcohol/week: 1.0 standard drink of alcohol    Types: 1 Glasses of wine per week   Drug use: Never   Sexual activity: Yes    Birth control/protection: Post-menopausal, Surgical    Comment: Hysterectomy  Other Topics Concern   Not on file  Social History Narrative   Not on file   Social Drivers of Health   Financial Resource Strain: Low Risk  (09/01/2023)   Overall Financial Resource Strain (CARDIA)    Difficulty of Paying Living Expenses: Not hard at all  Food Insecurity: No Food Insecurity (09/01/2023)   Hunger Vital Sign    Worried About Running Out of Food in the Last Year: Never true    Ran Out of Food in the Last Year: Never true  Transportation Needs: No Transportation Needs (09/01/2023)   PRAPARE - Administrator, Civil Service (Medical): No    Lack of Transportation (Non-Medical): No  Physical Activity: Sufficiently Active (09/01/2023)   Exercise Vital Sign    Days of Exercise per Week: 6 days    Minutes of Exercise per Session: 30 min  Stress: No Stress Concern Present (09/01/2023)   [employer] of Occupational Health - Occupational Stress Questionnaire    Feeling of Stress : Not at all  Social Connections: Socially Integrated (09/01/2023)   Social Connection and Isolation Panel [NHANES]    Frequency of Communication with Friends and Family: More than three times a week    Frequency of Social Gatherings with Friends and Family: Twice a week    Attends Religious Services: More than 4 times per year    Active Member of [employer] or Organizations: Yes    Attends Engineer, structural: More than 4 times per  year    Marital Status: Married     Family History: The patient's family history includes Breast cancer in her maternal aunt, maternal aunt, maternal aunt, mother, and sister; Kidney cancer in her brother; Lung disease in her father; Multiple myeloma in her brother; Uterine cancer in her mother.  ROS:   Review of Systems  Constitution: Negative for decreased appetite, fever and weight gain.  HENT: Negative for congestion, ear discharge, hoarse voice and sore throat.   Eyes: Negative for discharge, redness, vision loss in right eye and visual halos.  Cardiovascular: Negative for chest pain, dyspnea on exertion, leg swelling, orthopnea and palpitations.  Respiratory: Negative for cough, hemoptysis, shortness of breath and snoring.   Endocrine: Negative for heat intolerance and polyphagia.  Hematologic/Lymphatic:  Negative for bleeding problem. Does not bruise/bleed easily.  Skin: Negative for flushing, nail changes, rash and suspicious lesions.  Musculoskeletal: Negative for arthritis, joint pain, muscle cramps, myalgias, neck pain and stiffness.  Gastrointestinal: Negative for abdominal pain, bowel incontinence, diarrhea and excessive appetite.  Genitourinary: Negative for decreased libido, genital sores and incomplete emptying.  Neurological: Negative for brief paralysis, focal weakness, headaches and loss of balance.  Psychiatric/Behavioral: Negative for altered mental status, depression and suicidal ideas.  Allergic/Immunologic: Negative for HIV exposure and persistent infections.    EKGs/Labs/Other Studies Reviewed:    The following studies were reviewed today:   EKG: Sinus rhythm HR 68 bpm, 1st degree AV block    Zio monitor  Patch Wear Time:  6 days and 0 hours (2022-03-03T11:48:50-0500 to 2022-03-09T12:08:55-0500)   Patient had a min HR of 53 bpm, max HR of 158 bpm, and avg HR of 77 bpm. Predominant underlying rhythm was Sinus Rhythm. First Degree AV Block was present. 2  Supraventricular Tachycardia runs occurred, the run with the fastest interval lasting 5 beats with a max rate of 158 bpm, the longest lasting 6 beats with an avg rate of 105 bpm. Isolated SVEs were rare (<1.0%), SVE Triplets were rare (<1.0%), and no SVE Couplets were present. Isolated VEs were rare (<1.0%), and no VE Couplets or VE Triplets were  present.   There were no pauses of 3 seconds or greater and no episodes of second or third-degree AV nodal block or sinus node exit block. There were 6 triggered and 6 diary events all sinus rhythm. Ventricular ectopy was rare. Supraventricular ectopy was rare.   Conclusion unremarkable event monitor.     TTE 10/30/20  IMPRESSIONS   1. Left ventricular ejection fraction, by estimation, is 60 to 65%. The left ventricle has normal function. The left ventricle has no regional wall motion abnormalities. There is mild concentric left ventricular hypertrophy. Left ventricular diastolic parameters are consistent with Grade I diastolic dysfunction (impaired relaxation).   2. Right ventricular systolic function is normal. The right ventricular size is normal. Tricuspid regurgitation signal is inadequate for assessing PA pressure.   3. The mitral valve is normal in structure. Trivial mitral valve regurgitation. No evidence of mitral stenosis.   4. The aortic valve was not well visualized. Aortic valve regurgitation is not visualized. No aortic stenosis is present. Aortic valve area, by VTI measures 2.60 cm. Aortic valve mean gradient measures 3.0 mmHg. Aortic valve Vmax measures 1.01 m/s.   5. The inferior vena cava is normal in size with greater than 50% respiratory variability, suggesting right atrial pressure of 3 mmHg.   FINDINGS   Left Ventricle: Left ventricular ejection fraction, by estimation, is 60  to 65%. The left ventricle has normal function. The left ventricle has no  regional wall motion abnormalities. The left ventricular internal cavity  size  was normal in size. There is   mild concentric left ventricular hypertrophy. Left ventricular diastolic  parameters are consistent with Grade I diastolic dysfunction (impaired  relaxation). Normal left ventricular filling pressure.   Right Ventricle: The right ventricular size is normal. No increase in  right ventricular wall thickness. Right ventricular systolic function is  normal. Tricuspid regurgitation signal is inadequate for assessing PA  pressure.   Left Atrium: Left atrial size was normal in size.   Right Atrium: Right atrial size was normal in size.   Pericardium: There is no evidence of pericardial effusion.   Mitral Valve: The mitral valve is normal in  structure. Trivial mitral  valve regurgitation. No evidence of mitral valve stenosis. MV peak  gradient, 5.6 mmHg. The mean mitral valve gradient is 2.0 mmHg.   Tricuspid Valve: The tricuspid valve is normal in structure. Tricuspid  valve regurgitation is trivial. No evidence of tricuspid stenosis.   Aortic Valve: The aortic valve was not well visualized. Aortic valve  regurgitation is not visualized. No aortic stenosis is present. Aortic  valve mean gradient measures 3.0 mmHg. Aortic valve peak gradient measures  4.1 mmHg. Aortic valve area, by VTI  measures 2.60 cm.   Pulmonic Valve: The pulmonic valve was normal in structure. Pulmonic valve  regurgitation is not visualized. No evidence of pulmonic stenosis.   Aorta: The aortic root is normal in size and structure.   Venous: The inferior vena cava is normal in size with greater than 50%  respiratory variability, suggesting right atrial pressure of 3 mmHg.   IAS/Shunts: No atrial level shunt detected by color flow Doppler.   Recent Labs: 11/12/2022: TSH 3.210 09/02/2023: ALT 24; BUN 13; Creatinine, Ser 0.78; Hemoglobin 12.7; Platelets 203; Potassium 5.3; Sodium 140  Recent Lipid Panel    Component Value Date/Time   CHOL 161 09/02/2023 0936   TRIG 37 09/02/2023  0936   HDL 78 09/02/2023 0936   CHOLHDL 2.1 09/02/2023 0936   LDLCALC 74 09/02/2023 0936    Physical Exam:    VS:  BP (!) 176/84 (BP Location: Right Arm, Patient Position: Sitting, Cuff Size: Normal)   Pulse 68   Ht 5\' 1"  (1.549 m)   Wt 111 lb 3.2 oz (50.4 kg)   SpO2 99%   BMI 21.01 kg/m     Wt Readings from Last 3 Encounters:  09/05/23 111 lb 3.2 oz (50.4 kg)  09/02/23 111 lb (50.3 kg)  05/14/23 109 lb 9.6 oz (49.7 kg)     GEN: Well nourished, well developed in no acute distress HEENT: Normal NECK: No JVD; No carotid bruits LYMPHATICS: No lymphadenopathy CARDIAC: S1S2 noted,RRR, no murmurs, rubs, gallops RESPIRATORY:  Clear to auscultation without rales, wheezing or rhonchi  ABDOMEN: Soft, non-tender, non-distended, +bowel sounds, no guarding. EXTREMITIES: No edema, No cyanosis, no clubbing MUSCULOSKELETAL:  No deformity  SKIN: Warm and dry NEUROLOGIC:  Alert and oriented x 3, non-focal PSYCHIATRIC:  Normal affect, good insight  ASSESSMENT:    1. Essential (primary) hypertension   2. PSVT (paroxysmal supraventricular tachycardia) (HCC)   3. Vitamin D deficiency   4. Prediabetes     PLAN:    Her blood pressure elevated in the office today.  But she has had consistently normal blood pressure at home on the blood pressure cuff at home.  We have done a office check with his and his functions normally.  Suspect that whitecoat hypertension may be playing a role here.  Will add her blood pressure readings through her chart.  Will get a vitamin D today.    The patient is in agreement with the above plan. The patient left the office in stable condition.  The patient will follow up in 1 year.   Medication Adjustments/Labs and Tests Ordered: Current medicines are reviewed at length with the patient today.  Concerns regarding medicines are outlined above.  Orders Placed This Encounter  Procedures   VITAMIN D 25 Hydroxy (Vit-D Deficiency, Fractures)   EKG 12-Lead     No orders of the defined types were placed in this encounter.    Patient Instructions  Medication Instructions:  Your physician recommends that you  continue on your current medications as directed. Please refer to the Current Medication list given to you today.  *If you need a refill on your cardiac medications before your next appointment, please call your pharmacy*   Lab Work: Vit D If you have labs (blood work) drawn today and your tests are completely normal, you will receive your results only by: MyChart Message (if you have MyChart) OR A paper copy in the mail If you have any lab test that is abnormal or we need to change your treatment, we will call you to review the results.  Follow-Up: At [facility], you and your health needs are our priority.  As part of our continuing mission to provide you with exceptional heart care, we have created designated Provider Care Teams.  These Care Teams include your primary Cardiologist (physician) and Advanced Practice Providers (APPs -  Physician Assistants and Nurse Practitioners) who all work together to provide you with the care you need, when you need it.  Your next appointment:   1 year(s)  Provider:   Thomasene Ripple, DO     Other Instructions:     Adopting a Healthy Lifestyle.  Know what a healthy weight is for you (roughly BMI <25) and aim to maintain this   Aim for 7+ servings of fruits and vegetables daily   65-80+ fluid ounces of water or unsweet tea for healthy kidneys   Limit to max 1 drink of alcohol per day; avoid smoking/tobacco   Limit animal fats in diet for cholesterol and heart health - choose grass fed whenever available   Avoid highly processed foods, and foods high in saturated/trans fats   Aim for low stress - take time to unwind and care for your mental health   Aim for 150 min of moderate intensity exercise weekly for heart health, and weights twice weekly for bone health   Aim for 7-9  hours of sleep daily   When it comes to diets, agreement about the perfect plan isnt easy to find, even among the experts. Experts at the [facility] of [employer] developed an idea known as the Healthy Eating Plate. Just imagine a plate divided into logical, healthy portions.   The emphasis is on diet quality:   Load up on vegetables and fruits - one-half of your plate: Aim for color and variety, and remember that potatoes dont count.   Go for whole grains - one-quarter of your plate: Whole wheat, barley, wheat berries, quinoa, oats, brown rice, and foods made with them. If you want pasta, go with whole wheat pasta.   Protein power - one-quarter of your plate: Fish, chicken, beans, and nuts are all healthy, versatile protein sources. Limit red meat.   The diet, however, does go beyond the plate, offering a few other suggestions.   Use healthy plant oils, such as olive, canola, soy, corn, sunflower and peanut. Check the labels, and avoid partially hydrogenated oil, which have unhealthy trans fats.   If youre thirsty, drink water. Coffee and tea are good in moderation, but skip sugary drinks and limit milk and dairy products to one or two daily servings.   The type of carbohydrate in the diet is more important than the amount. Some sources of carbohydrates, such as vegetables, fruits, whole grains, and beans-are healthier than others.   Finally, stay active  Signed, Thomasene Ripple, DO  09/05/2023 8:56 AM    [facility]

## 2023-10-01 DIAGNOSIS — D2372 Other benign neoplasm of skin of left lower limb, including hip: Secondary | ICD-10-CM | POA: Diagnosis not present

## 2023-10-01 DIAGNOSIS — D485 Neoplasm of uncertain behavior of skin: Secondary | ICD-10-CM | POA: Diagnosis not present

## 2023-10-01 DIAGNOSIS — L821 Other seborrheic keratosis: Secondary | ICD-10-CM | POA: Diagnosis not present

## 2023-10-03 ENCOUNTER — Other Ambulatory Visit: Payer: Self-pay

## 2023-10-03 MED ORDER — FAMOTIDINE 40 MG PO TABS: 40.0000 mg | ORAL_TABLET | Freq: Every day | ORAL | 0 refills | Status: DC

## 2023-10-06 ENCOUNTER — Other Ambulatory Visit: Payer: Self-pay

## 2023-12-02 ENCOUNTER — Ambulatory Visit: Payer: PPO | Admitting: Family Medicine

## 2023-12-03 ENCOUNTER — Other Ambulatory Visit: Payer: Self-pay | Admitting: Family Medicine

## 2023-12-04 ENCOUNTER — Telehealth: Payer: Self-pay

## 2023-12-04 NOTE — Telephone Encounter (Signed)
 LM for patient to call the office to schedule her Prolia  shot.  She is due 12/06/23 or after.  Please remind her to notify us  that she is on the way 30 minutes prior to her appointment.

## 2023-12-10 ENCOUNTER — Encounter: Payer: Self-pay | Admitting: Family Medicine

## 2023-12-10 ENCOUNTER — Ambulatory Visit

## 2023-12-10 ENCOUNTER — Ambulatory Visit: Admitting: Family Medicine

## 2023-12-10 VITALS — BP 132/70 | HR 65 | Temp 97.8°F | Ht 61.0 in | Wt 107.0 lb

## 2023-12-10 DIAGNOSIS — E782 Mixed hyperlipidemia: Secondary | ICD-10-CM

## 2023-12-10 DIAGNOSIS — M810 Age-related osteoporosis without current pathological fracture: Secondary | ICD-10-CM | POA: Diagnosis not present

## 2023-12-10 DIAGNOSIS — I471 Supraventricular tachycardia, unspecified: Secondary | ICD-10-CM

## 2023-12-10 DIAGNOSIS — F5101 Primary insomnia: Secondary | ICD-10-CM | POA: Diagnosis not present

## 2023-12-10 DIAGNOSIS — R7303 Prediabetes: Secondary | ICD-10-CM | POA: Diagnosis not present

## 2023-12-10 DIAGNOSIS — I10 Essential (primary) hypertension: Secondary | ICD-10-CM | POA: Diagnosis not present

## 2023-12-10 MED ORDER — DENOSUMAB 60 MG/ML ~~LOC~~ SOSY
60.0000 mg | PREFILLED_SYRINGE | Freq: Once | SUBCUTANEOUS | Status: AC
  Administered 2023-12-10: 60 mg via SUBCUTANEOUS

## 2023-12-10 MED ORDER — DENOSUMAB 60 MG/ML ~~LOC~~ SOSY
60.0000 mg | PREFILLED_SYRINGE | Freq: Once | SUBCUTANEOUS | Status: AC
  Administered 2024-06-10: 60 mg via SUBCUTANEOUS

## 2023-12-10 NOTE — Progress Notes (Signed)
 Subjective:  Patient ID: Crystal Oneal, female    DOB: 1951-07-14  Age: 73 y.o. MRN: [id number]  Chief Complaint  Patient presents with   Medical Management of Chronic Issues    HPI:  Pt presents for follow-up of hypertension.    Hypertension and SVT, follow-up: She was last seen for hypertension. Management includes Metoprolol  12.5 every day, Valsartan  320 MG every day, and hydrochlorothiazide  12.5 MG every day. She is following a healthy diet. Started a vegetarian diet and has incorporated some meat. She is exercising.    Hyperlipidemia: Was taking Atorvastatin  10 mg twice weekly but stopped because of nausea/malaise.  She has been taking fish oil.    Prediabetes: A1C 6.0.  Eating health and exercising daily. 30 minutes daily.      12/10/2023    9:47 AM 08/20/2023    8:49 AM 05/14/2023    8:45 AM 11/12/2022    8:39 AM 08/16/2022   10:34 AM  Depression screen PHQ 2/9  Decreased Interest 0 0 0 0 0  Down, Depressed, Hopeless 0 0 0 0 0  PHQ - 2 Score 0 0 0 0 0  Altered sleeping 1 0 3    Tired, decreased energy 0 0 0    Change in appetite 0 0 0    Feeling bad or failure about yourself  0 0 0    Trouble concentrating 0 0 0    Moving slowly or fidgety/restless 0 0 0    Suicidal thoughts 0 0 0    PHQ-9 Score 1 0 3    Difficult doing work/chores Not difficult at all Not difficult at all Not difficult at all          12/10/2023    9:47 AM  Fall Risk   Falls in the past year? 0  Number falls in past yr: 0  Injury with Fall? 0  Risk for fall due to : No Fall Risks  Follow up Falls evaluation completed    Patient Care Team: Mercy Stall, MD as PCP - General (Family Medicine) Tobb, Kardie, DO as PCP - Cardiology (Cardiology) Jerlene Moody, MD (Gastroenterology) Garry Kansas, MD as Consulting Physician (Neurosurgery)   Review of Systems  Constitutional:  Negative for chills, fatigue and fever.  HENT:  Negative for congestion, ear pain, rhinorrhea and sore throat.    Respiratory:  Negative for cough and shortness of breath.   Cardiovascular:  Negative for chest pain.  Gastrointestinal:  Negative for abdominal pain, constipation, diarrhea, nausea and vomiting.  Genitourinary:  Negative for dysuria and urgency.  Musculoskeletal:  Positive for arthralgias. Negative for back pain and myalgias.  Neurological:  Negative for dizziness, weakness, light-headedness and headaches.  Psychiatric/Behavioral:  Negative for dysphoric mood. The patient is not nervous/anxious.     Current Outpatient Medications on File Prior to Visit  Medication Sig Dispense Refill   b complex vitamins tablet Take 1 tablet by mouth every evening.     calcium  citrate-vitamin D  (CITRACAL+D) 315-200 MG-UNIT tablet Take 2 tablets by mouth 2 (two) times daily.     cetirizine (ZYRTEC) 10 MG tablet Take 5-10 mg by mouth daily as needed for allergies (during spring months).     Cholecalciferol  (VITAMIN D ) 2000 units tablet Take 2,000 Units by mouth in the morning.     denosumab  (PROLIA ) 60 MG/ML SOSY injection Inject 60 mg into the skin every 6 (six) months.     famotidine  (PEPCID ) 40 MG tablet Take 1 tablet (40 mg total) by  mouth daily. 90 tablet 0   fluticasone  (FLONASE ) 50 MCG/ACT nasal spray Place 1 spray into both nostrils daily as needed for allergies or rhinitis.     Ginger, Zingiber officinalis, (GINGER ROOT) 550 MG CAPS Take 1 capsule by mouth in the morning and at bedtime.     metoprolol  succinate (TOPROL -XL) 25 MG 24 hr tablet Take 1 tablet (25 mg total) by mouth daily. 90 tablet 1   Omega-3 Fatty Acids (FISH OIL PO) Take 2,400 mg by mouth in the morning.     rosuvastatin  (CRESTOR ) 5 MG tablet Take 1 tablet (5 mg total) by mouth daily. 30 tablet 0   TURMERIC CURCUMIN PO Take 1,000 mg by mouth in the morning.     valsartan  (DIOVAN ) 320 MG tablet Take 1 tablet (320 mg total) by mouth daily. 90 tablet 1   vitamin B-12 (CYANOCOBALAMIN) 1000 MCG tablet Take 1,000 mcg by mouth in the  morning.     vitamin C (ASCORBIC ACID) 250 MG tablet Take 250 mg by mouth 2 (two) times daily.     No current facility-administered medications on file prior to visit.   Past Medical History:  Diagnosis Date   Age-related osteoporosis without current pathological fracture    Arthritis    Cancer (HCC) 04/2020   SCC skin cancer removed from nose   Cervical stenosis of spine    C11-12   Dysphagia    Esophageal dilation x2   Essential (primary) hypertension    GERD (gastroesophageal reflux disease)    Hypertension    Mixed hyperlipidemia    Paroxysmal SVT (supraventricular tachycardia) (HCC)    PONV (postoperative nausea and vomiting)    Spondylolisthesis of lumbar region    Past Surgical History:  Procedure Laterality Date   ABDOMINAL HYSTERECTOMY     ANTERIOR AND POSTERIOR REPAIR N/A 03/20/2016   Procedure: REPAIR CYSTOCELE AND RECTOCELE;  Surgeon: Erman Hayward, MD;  Location: [facility];  Service: Urology;  Laterality: N/A;   BACK SURGERY  2019   has hardware   BREAST BIOPSY Left 02/23/2022   COLONOSCOPY     ESOPHAGEAL DILATION  2005   x 2   MOHS SURGERY  04/2020   SCC skin cancer removed from nose   OPEN REDUCTION INTERNAL FIXATION (ORIF) DISTAL RADIAL FRACTURE Left 05/11/2022   Procedure: OPEN REDUCTION INTERNAL FIXATION (ORIF) LEFT DISTAL RADIUS FRACTURE;  Surgeon: Brunilda Capra, MD;  Location: [facility];  Service: Orthopedics;  Laterality: Left;  90 MIN   VAGINAL HYSTERECTOMY Bilateral 03/20/2016   Procedure: TOTAL VAGINAL HYSTERECTOMY WITH BILATERAL SALPINGO OOPHORECTOMY mccall coloplasty;  Surgeon: Ivery Marking, MD;  Location: [facility];  Service: Gynecology;  Laterality: Bilateral;   VAGINAL PROLAPSE REPAIR N/A 03/20/2016   Procedure: REPAIR VAULT PROLAPSE AND GRAFT;  Surgeon: Erman Hayward, MD;  Location: [facility];  Service: Urology;  Laterality: N/A;    Family History  Problem Relation Age of Onset   Uterine cancer Mother    Breast cancer Mother     Lung disease Father    Breast cancer Sister    Breast cancer Maternal Aunt    Breast cancer Maternal Aunt    Breast cancer Maternal Aunt    Multiple myeloma Brother    Kidney cancer Brother    Social History   Socioeconomic History   Marital status: Married    Spouse name: Adaline Holly   Number of children: 2   Years of education: Not on file   Highest education level: Professional school degree (e.g.,  MD, DDS, DVM, JD)  Occupational History   Occupation: Retired  Tobacco Use   Smoking status: Never   Smokeless tobacco: Never  Vaping Use   Vaping status: Never Used  Substance and Sexual Activity   Alcohol use: Not Currently    Alcohol/week: 1.0 standard drink of alcohol    Types: 1 Glasses of wine per week   Drug use: Never   Sexual activity: Yes    Birth control/protection: Post-menopausal, Surgical    Comment: Hysterectomy  Other Topics Concern   Not on file  Social History Narrative   Not on file   Social Drivers of Health   Financial Resource Strain: Low Risk  (09/01/2023)   Overall Financial Resource Strain (CARDIA)    Difficulty of Paying Living Expenses: Not hard at all  Food Insecurity: No Food Insecurity (09/01/2023)   Hunger Vital Sign    Worried About Running Out of Food in the Last Year: Never true    Ran Out of Food in the Last Year: Never true  Transportation Needs: No Transportation Needs (09/01/2023)   PRAPARE - Administrator, Civil Service (Medical): No    Lack of Transportation (Non-Medical): No  Physical Activity: Sufficiently Active (09/01/2023)   Exercise Vital Sign    Days of Exercise per Week: 6 days    Minutes of Exercise per Session: 30 min  Stress: No Stress Concern Present (09/01/2023)   [employer] of Occupational Health - Occupational Stress Questionnaire    Feeling of Stress : Not at all  Social Connections: Socially Integrated (09/01/2023)   Social Connection and Isolation Panel [NHANES]    Frequency of Communication  with Friends and Family: More than three times a week    Frequency of Social Gatherings with Friends and Family: Twice a week    Attends Religious Services: More than 4 times per year    Active Member of [employer] or Organizations: Yes    Attends Engineer, structural: More than 4 times per year    Marital Status: Married    Objective:  BP 132/70   Pulse 65   Temp 97.8 F (36.6 C)   Ht 5\' 1"  (1.549 m)   Wt 107 lb (48.5 kg)   SpO2 95%   BMI 20.22 kg/m      12/10/2023    9:45 AM 09/05/2023    8:14 AM 09/02/2023    9:09 AM  BP/Weight  Systolic BP 132 176 130  Diastolic BP 70 84 68  Wt. (Lbs) 107 111.2 111  BMI 20.22 kg/m2 21.01 kg/m2 20.97 kg/m2    Physical Exam Vitals reviewed.  Constitutional:      Appearance: Normal appearance. She is normal weight.  Neck:     Vascular: No carotid bruit.  Cardiovascular:     Rate and Rhythm: Normal rate and regular rhythm.     Heart sounds: Normal heart sounds.  Pulmonary:     Effort: Pulmonary effort is normal. No respiratory distress.     Breath sounds: Normal breath sounds.  Abdominal:     General: Abdomen is flat. Bowel sounds are normal.     Palpations: Abdomen is soft.     Tenderness: There is no abdominal tenderness.  Neurological:     Mental Status: She is alert and oriented to person, place, and time.  Psychiatric:        Mood and Affect: Mood normal.        Behavior: Behavior normal.     Diabetic Foot  Exam - Simple   No data filed      Lab Results  Component Value Date   WBC 3.9 12/10/2023   HGB 11.7 12/10/2023   HCT 37.0 12/10/2023   PLT 218 12/10/2023   GLUCOSE 93 12/10/2023   CHOL 173 12/10/2023   TRIG 51 12/10/2023   HDL 71 12/10/2023   LDLCALC 92 12/10/2023   ALT 29 12/10/2023   AST 41 (H) 12/10/2023   NA 141 12/10/2023   K 4.9 12/10/2023   CL 104 12/10/2023   CREATININE 0.75 12/10/2023   BUN 15 12/10/2023   CO2 24 12/10/2023   TSH 3.210 11/12/2022   INR 1.0 05/11/2022   HGBA1C 5.9 (H)  12/10/2023      Assessment & Plan:  Essential (primary) hypertension Assessment & Plan: Well controlled.  No changes to medicines. Management includes Metoprolol  12.5 every day, Valsartan  320 MG every day, and hydrochlorothiazide  12.5 MG every day.  Continue to work on eating a healthy diet and exercise.  Labs drawn today.    Orders: -     CBC with Differential/Platelet -     Comprehensive metabolic panel with GFR  Mixed hyperlipidemia Assessment & Plan: Await labs/testing for assessment and recommendations. Currently on crestor  5 mg before bed, which was started 3 months ago. Recommend continue to work on eating healthy diet and exercise.   Orders: -     Lipid panel  Prediabetes Assessment & Plan: Hemoglobin A1c 5.9%, 3 month avg of blood sugars, is in prediabetic range.  In order to prevent progression to diabetes, recommend low carb diet and regular exercise   Orders: -     Hemoglobin A1c  Primary insomnia Assessment & Plan: Patient reports difficulty staying asleep and occasional difficulty falling asleep. Discussed sleep hygiene and potential use of magnesium glycinate. -Consider trial of magnesium glycinate for sleep. -Continue good sleep hygiene practices, including limiting caffeine intake and maintaining a quiet, dark sleep environment.    Age-related osteoporosis without current pathological fracture Assessment & Plan: The current medical regimen is effective;  continue present plan and medications.   Orders: -     Denosumab  -     Denosumab   PSVT (paroxysmal supraventricular tachycardia) (HCC) Assessment & Plan: Continue toprol  xl 25 mg daily.  Management per specialist.        Meds ordered this encounter  Medications   denosumab  (PROLIA ) injection 60 mg    Patient is enrolled in [facility] program for this medication and I have provided a copy of the Prolia  Medication Guide and Patient Brochure.:   No    I have reviewed with the patient the  information in the Prolia  Medication Guide and Patient Counseling Chart including the serious risks of Prolia  and symptoms of each risk.:   Yes    I have advised the patient to seek medical attention if they have signs or symptoms of any of the serious risks.:   Yes   denosumab  (PROLIA ) injection 60 mg    Patient is enrolled in [facility]S program for this medication and I have provided a copy of the Prolia  Medication Guide and Patient Brochure.:   No    I have reviewed with the patient the information in the Prolia  Medication Guide and Patient Counseling Chart including the serious risks of Prolia  and symptoms of each risk.:   Yes    I have advised the patient to seek medical attention if they have signs or symptoms of any of the serious risks.:   Yes  Orders Placed This Encounter  Procedures   CBC with Differential/Platelet   Comprehensive metabolic panel with GFR   Hemoglobin A1c   Lipid panel     Follow-up: Return in about 6 months (around 06/10/2024) for chronic follow up.   I,Marla I Leal-Borjas,acting as a scribe for Mercy Stall, MD.,have documented all relevant documentation on the behalf of Mercy Stall, MD,as directed by  Mercy Stall, MD while in the presence of Mercy Stall, MD.   An After Visit Summary was printed and given to the patient.  I attest that I have reviewed this visit and agree with the plan scribed by my staff.   Mercy Stall, MD [facility] [phone]

## 2023-12-11 ENCOUNTER — Encounter: Payer: Self-pay | Admitting: Family Medicine

## 2023-12-11 LAB — COMPREHENSIVE METABOLIC PANEL WITH GFR
ALT: 29 IU/L (ref 0–32)
AST: 41 IU/L — ABNORMAL HIGH (ref 0–40)
Albumin: 4.3 g/dL (ref 3.8–4.8)
Alkaline Phosphatase: 32 IU/L — ABNORMAL LOW (ref 44–121)
BUN/Creatinine Ratio: 20 (ref 12–28)
BUN: 15 mg/dL (ref 8–27)
Bilirubin Total: 0.2 mg/dL (ref 0.0–1.2)
CO2: 24 mmol/L (ref 20–29)
Calcium: 10.1 mg/dL (ref 8.7–10.3)
Chloride: 104 mmol/L (ref 96–106)
Creatinine, Ser: 0.75 mg/dL (ref 0.57–1.00)
Globulin, Total: 2.4 g/dL (ref 1.5–4.5)
Glucose: 93 mg/dL (ref 70–99)
Potassium: 4.9 mmol/L (ref 3.5–5.2)
Sodium: 141 mmol/L (ref 134–144)
Total Protein: 6.7 g/dL (ref 6.0–8.5)
eGFR: 85 mL/min/{1.73_m2} (ref >59)

## 2023-12-11 LAB — CBC WITH DIFFERENTIAL/PLATELET
Basophils Absolute: 0 10*3/uL (ref 0.0–0.2)
Basos: 1 %
EOS (ABSOLUTE): 0.1 10*3/uL (ref 0.0–0.4)
Eos: 3 %
Hematocrit: 37 % (ref 34.0–46.6)
Hemoglobin: 11.7 g/dL (ref 11.1–15.9)
Immature Grans (Abs): 0 10*3/uL (ref 0.0–0.1)
Immature Granulocytes: 0 %
Lymphocytes Absolute: 1.6 10*3/uL (ref 0.7–3.1)
Lymphs: 40 %
MCH: 27.9 pg (ref 26.6–33.0)
MCHC: 31.6 g/dL (ref 31.5–35.7)
MCV: 88 fL (ref 79–97)
Monocytes Absolute: 0.5 10*3/uL (ref 0.1–0.9)
Monocytes: 12 %
Neutrophils Absolute: 1.8 10*3/uL (ref 1.4–7.0)
Neutrophils: 44 %
Platelets: 218 10*3/uL (ref 150–450)
RBC: 4.2 x10E6/uL (ref 3.77–5.28)
RDW: 12.7 % (ref 11.7–15.4)
WBC: 3.9 10*3/uL (ref 3.4–10.8)

## 2023-12-11 LAB — HEMOGLOBIN A1C
Est. average glucose Bld gHb Est-mCnc: 123 mg/dL
Hgb A1c MFr Bld: 5.9 % — ABNORMAL HIGH (ref 4.8–5.6)

## 2023-12-11 LAB — LIPID PANEL
Chol/HDL Ratio: 2.4 ratio (ref 0.0–4.4)
Cholesterol, Total: 173 mg/dL (ref 100–199)
HDL: 71 mg/dL (ref >39)
LDL Chol Calc (NIH): 92 mg/dL (ref 0–99)
Triglycerides: 51 mg/dL (ref 0–149)
VLDL Cholesterol Cal: 10 mg/dL (ref 5–40)

## 2023-12-12 DIAGNOSIS — R7303 Prediabetes: Secondary | ICD-10-CM | POA: Insufficient documentation

## 2023-12-12 NOTE — Assessment & Plan Note (Signed)
 Well controlled.  No changes to medicines. Management includes Metoprolol  12.5 every day, Valsartan  320 MG every day, and hydrochlorothiazide  12.5 MG every day.  Continue to work on eating a healthy diet and exercise.  Labs drawn today.

## 2023-12-12 NOTE — Assessment & Plan Note (Signed)
 Continue toprol xl 25 mg daily.  Management per specialist.

## 2023-12-12 NOTE — Assessment & Plan Note (Signed)
Hemoglobin A1c 5.9%, 3 month avg of blood sugars, is in prediabetic range.  In order to prevent progression to diabetes, recommend low carb diet and regular exercise

## 2023-12-12 NOTE — Assessment & Plan Note (Signed)
 Await labs/testing for assessment and recommendations. Currently on crestor 5 mg before bed, which was started 3 months ago. Recommend continue to work on eating healthy diet and exercise.

## 2023-12-12 NOTE — Assessment & Plan Note (Signed)
 The current medical regimen is effective;  continue present plan and medications.

## 2023-12-12 NOTE — Assessment & Plan Note (Signed)
 Patient reports difficulty staying asleep and occasional difficulty falling asleep. Discussed sleep hygiene and potential use of magnesium glycinate. -Consider trial of magnesium glycinate for sleep. -Continue good sleep hygiene practices, including limiting caffeine intake and maintaining a quiet, dark sleep environment.

## 2024-01-03 ENCOUNTER — Other Ambulatory Visit: Payer: Self-pay | Admitting: Family Medicine

## 2024-01-08 ENCOUNTER — Other Ambulatory Visit: Payer: Self-pay | Admitting: Family Medicine

## 2024-01-08 DIAGNOSIS — Z1231 Encounter for screening mammogram for malignant neoplasm of breast: Secondary | ICD-10-CM

## 2024-02-12 ENCOUNTER — Ambulatory Visit
Admission: RE | Admit: 2024-02-12 | Discharge: 2024-02-12 | Disposition: A | Discharge status: Home / Self Care | Source: Ambulatory Visit | Attending: Family Medicine | Admitting: Family Medicine

## 2024-02-12 DIAGNOSIS — Z1231 Encounter for screening mammogram for malignant neoplasm of breast: Secondary | ICD-10-CM

## 2024-02-17 ENCOUNTER — Ambulatory Visit: Payer: Self-pay | Admitting: Family Medicine

## 2024-02-22 ENCOUNTER — Other Ambulatory Visit: Payer: Self-pay | Admitting: Family Medicine

## 2024-02-22 DIAGNOSIS — I10 Essential (primary) hypertension: Secondary | ICD-10-CM

## 2024-03-02 ENCOUNTER — Other Ambulatory Visit: Payer: Self-pay | Admitting: Family Medicine

## 2024-03-23 ENCOUNTER — Encounter: Payer: Self-pay | Admitting: Family Medicine

## 2024-03-23 ENCOUNTER — Ambulatory Visit (INDEPENDENT_AMBULATORY_CARE_PROVIDER_SITE_OTHER): Admitting: Family Medicine

## 2024-03-23 VITALS — BP 138/70 | HR 70 | Temp 98.7°F | Resp 16 | Ht 61.0 in | Wt 106.0 lb

## 2024-03-23 DIAGNOSIS — D1722 Benign lipomatous neoplasm of skin and subcutaneous tissue of left arm: Secondary | ICD-10-CM | POA: Insufficient documentation

## 2024-03-23 DIAGNOSIS — L304 Erythema intertrigo: Secondary | ICD-10-CM | POA: Insufficient documentation

## 2024-03-23 MED ORDER — NYSTATIN 100000 UNIT/GM EX POWD: 1.0000 | Freq: Three times a day (TID) | CUTANEOUS | 0 refills | Status: AC

## 2024-03-23 NOTE — Progress Notes (Signed)
 Subjective:  Patient ID: Crystal Oneal, female    DOB: 1950-10-23  Age: 73 y.o. MRN: [id number]  Chief Complaint  Patient presents with   Cyst    Discussed the use of AI scribe software for clinical note transcription with the patient, who gave verbal consent to proceed.  History of Present Illness   Crystal Oneal is a 73 year old female who presents with multiple soft, puffy areas on the left side of her body and a purple spot in the genital area.  Approximately two months ago, she noticed a soft, puffy area on the left side of her body. Since then, two additional similar areas have appeared, all on the left side. These areas are visible but not painful, described as 'little puffy places' that are not red or broken out. Initially, she was not concerned, but the appearance of additional areas prompted her to seek medical attention.  She also has dark spots on her bottom, including a small purple spot in the genital area, noticed around the same time as the puffy areas. The spot is located where the labial flap lays over and is described as 'tiny, tiny, like sort of purple.' It does not hurt, bleed, or change in appearance. She experiences irritation and soreness in the area, especially when it becomes damp, which she associates with dryness and moisture.  She has a family history of cancer on her mother's side, influencing her decision to avoid estrogen treatments. She has tried vaginal estradiol  cream but found it uncomfortable, opting instead to use lubricant to manage dryness. She is sexually active and uses lubricant to prevent irritation.  Her past medical history includes cystocele and rectocele repair. She wonders if the purple spot could be related to past surgical interventions, describing it as looking like a bruise, though it has not changed over time.          03/23/2024   10:05 AM 12/10/2023    9:47 AM 08/20/2023    8:49 AM 05/14/2023    8:45 AM 11/12/2022    8:39 AM   Depression screen PHQ 2/9  Decreased Interest 0 0 0 0 0  Down, Depressed, Hopeless 0 0 0 0 0  PHQ - 2 Score 0 0 0 0 0  Altered sleeping 1 1 0 3   Tired, decreased energy 0 0 0 0   Change in appetite 0 0 0 0   Feeling bad or failure about yourself  0 0 0 0   Trouble concentrating 0 0 0 0   Moving slowly or fidgety/restless 0 0 0 0   Suicidal thoughts 0 0 0 0   PHQ-9 Score 1 1 0 3   Difficult doing work/chores Not difficult at all Not difficult at all Not difficult at all Not difficult at all         03/23/2024   10:04 AM  Fall Risk   Falls in the past year? 0  Number falls in past yr: 0  Injury with Fall? 0  Risk for fall due to : No Fall Risks  Follow up Falls evaluation completed    Patient Care Team: Sherre Clapper, MD as PCP - General (Family Medicine) Tobb, Kardie, DO as PCP - Cardiology (Cardiology) Towana Charleston, MD (Gastroenterology) Mavis Purchase, MD as Consulting Physician (Neurosurgery)   Review of Systems  Constitutional:  Negative for chills, diaphoresis, fatigue and fever.  HENT:  Negative for congestion, ear pain and sinus pain.   Eyes: Negative.  Respiratory:  Negative for cough and shortness of breath.   Cardiovascular:  Negative for chest pain.  Gastrointestinal:  Negative for abdominal pain, constipation, diarrhea, nausea and vomiting.  Endocrine: Negative.   Genitourinary:  Negative for dysuria, frequency and urgency.  Musculoskeletal:  Negative for arthralgias.  Skin:  Positive for color change.       Small puffy areas on left side in multiple areas Purple spot on genital area   Allergic/Immunologic: Negative.   Neurological:  Negative for dizziness, weakness, light-headedness and headaches.  Hematological: Negative.   Psychiatric/Behavioral:  Negative for dysphoric mood. The patient is not nervous/anxious.     Current Outpatient Medications on File Prior to Visit  Medication Sig Dispense Refill   b complex vitamins tablet Take 1 tablet by  mouth every evening.     calcium  citrate-vitamin D  (CITRACAL+D) 315-200 MG-UNIT tablet Take 2 tablets by mouth 2 (two) times daily.     cetirizine (ZYRTEC) 10 MG tablet Take 5-10 mg by mouth daily as needed for allergies (during spring months).     denosumab  (PROLIA ) 60 MG/ML SOSY injection Inject 60 mg into the skin every 6 (six) months.     famotidine  (PEPCID ) 40 MG tablet Take 1 tablet (40 mg total) by mouth daily. 90 tablet 1   fluticasone  (FLONASE ) 50 MCG/ACT nasal spray Place 1 spray into both nostrils daily as needed for allergies or rhinitis.     Ginger, Zingiber officinalis, (GINGER ROOT) 550 MG CAPS Take 1 capsule by mouth in the morning and at bedtime.     Magnesium Glycinate 100 MG CAPS Take 1 Capful by mouth daily.     metoprolol  succinate (TOPROL -XL) 25 MG 24 hr tablet Take 1 tablet (25 mg total) by mouth daily. 90 tablet 1   Omega-3 Fatty Acids (FISH OIL PO) Take 2,400 mg by mouth in the morning.     rosuvastatin  (CRESTOR ) 5 MG tablet Take 1 tablet (5 mg total) by mouth daily. 30 tablet 1   TURMERIC CURCUMIN PO Take 1,000 mg by mouth in the morning.     valsartan  (DIOVAN ) 320 MG tablet Take 1 tablet (320 mg total) by mouth daily. 90 tablet 1   vitamin B-12 (CYANOCOBALAMIN) 1000 MCG tablet Take 1,000 mcg by mouth in the morning.     vitamin C (ASCORBIC ACID) 250 MG tablet Take 250 mg by mouth 2 (two) times daily.     Cholecalciferol  (VITAMIN D ) 2000 units tablet Take 2,000 Units by mouth in the morning. (Patient not taking: Reported on 03/23/2024)     Current Facility-Administered Medications on File Prior to Visit  Medication Dose Route Frequency Provider Last Rate Last Admin   [START ON 06/11/2024] denosumab  (PROLIA ) injection 60 mg  60 mg Subcutaneous Once Sherre Clapper, MD       Past Medical History:  Diagnosis Date   Age-related osteoporosis without current pathological fracture    Arthritis    Cancer (HCC) 04/2020   SCC skin cancer removed from nose   Cervical stenosis of  spine    C11-12   Dysphagia    Esophageal dilation x2   Essential (primary) hypertension    GERD (gastroesophageal reflux disease)    Hypertension    Mixed hyperlipidemia    Paroxysmal SVT (supraventricular tachycardia) (HCC)    PONV (postoperative nausea and vomiting)    Spondylolisthesis of lumbar region    Past Surgical History:  Procedure Laterality Date   ABDOMINAL HYSTERECTOMY     ANTERIOR AND POSTERIOR REPAIR N/A 03/20/2016  Procedure: REPAIR CYSTOCELE AND RECTOCELE;  Surgeon: Glendia Elizabeth, MD;  Location: [facility];  Service: Urology;  Laterality: N/A;   BACK SURGERY  2019   has hardware   BREAST BIOPSY Left 02/23/2022   COLONOSCOPY     ESOPHAGEAL DILATION  2005   x 2   MOHS SURGERY  04/2020   SCC skin cancer removed from nose   OPEN REDUCTION INTERNAL FIXATION (ORIF) DISTAL RADIAL FRACTURE Left 05/11/2022   Procedure: OPEN REDUCTION INTERNAL FIXATION (ORIF) LEFT DISTAL RADIUS FRACTURE;  Surgeon: Murrell Drivers, MD;  Location: [facility];  Service: Orthopedics;  Laterality: Left;  90 MIN   VAGINAL HYSTERECTOMY Bilateral 03/20/2016   Procedure: TOTAL VAGINAL HYSTERECTOMY WITH BILATERAL SALPINGO OOPHORECTOMY mccall coloplasty;  Surgeon: Dickie Carder, MD;  Location: [facility];  Service: Gynecology;  Laterality: Bilateral;   VAGINAL PROLAPSE REPAIR N/A 03/20/2016   Procedure: REPAIR VAULT PROLAPSE AND GRAFT;  Surgeon: Glendia Elizabeth, MD;  Location: [facility];  Service: Urology;  Laterality: N/A;    Family History  Problem Relation Age of Onset   Uterine cancer Mother    Breast cancer Mother    Lung disease Father    Breast cancer Sister    Breast cancer Maternal Aunt    Breast cancer Maternal Aunt    Breast cancer Maternal Aunt    Multiple myeloma Brother    Kidney cancer Brother    Social History   Socioeconomic History   Marital status: Married    Spouse name: Helayne   Number of children: 2   Years of education: Not on file   Highest education  level: 12th grade  Occupational History   Occupation: Retired  Tobacco Use   Smoking status: Never   Smokeless tobacco: Never  Vaping Use   Vaping status: Never Used  Substance and Sexual Activity   Alcohol use: Not Currently    Alcohol/week: 1.0 standard drink of alcohol    Types: 1 Glasses of wine per week   Drug use: Never   Sexual activity: Yes    Birth control/protection: Post-menopausal, Surgical    Comment: Hysterectomy  Other Topics Concern   Not on file  Social History Narrative   Not on file   Social Drivers of Health   Financial Resource Strain: Low Risk  (03/21/2024)   Overall Financial Resource Strain (CARDIA)    Difficulty of Paying Living Expenses: Not hard at all  Food Insecurity: No Food Insecurity (03/21/2024)   Hunger Vital Sign    Worried About Running Out of Food in the Last Year: Never true    Ran Out of Food in the Last Year: Never true  Transportation Needs: No Transportation Needs (03/21/2024)   PRAPARE - Administrator, Civil Service (Medical): No    Lack of Transportation (Non-Medical): No  Physical Activity: Sufficiently Active (03/21/2024)   Exercise Vital Sign    Days of Exercise per Week: 6 days    Minutes of Exercise per Session: 30 min  Stress: No Stress Concern Present (03/21/2024)   [employer] of Occupational Health - Occupational Stress Questionnaire    Feeling of Stress: Only a little  Social Connections: Socially Integrated (03/21/2024)   Social Connection and Isolation Panel    Frequency of Communication with Friends and Family: More than three times a week    Frequency of Social Gatherings with Friends and Family: Three times a week    Attends Religious Services: More than 4 times per year    Active  Member of Clubs or Organizations: Yes    Attends Engineer, structural: More than 4 times per year    Marital Status: Married    Objective:  BP 138/70   Pulse 70   Temp 98.7 F (37.1 C) (Temporal)   Resp 16   Ht  5' 1 (1.549 m)   Wt 106 lb (48.1 kg)   SpO2 99%   BMI 20.03 kg/m      03/23/2024    9:55 AM 12/10/2023    9:45 AM 09/05/2023    8:14 AM  BP/Weight  Systolic BP 138 132 176  Diastolic BP 70 70 84  Wt. (Lbs) 106 107 111.2  BMI 20.03 kg/m2 20.22 kg/m2 21.01 kg/m2    Physical Exam Vitals reviewed.  Constitutional:      General: She is not in acute distress.    Appearance: Normal appearance. She is not ill-appearing.  Eyes:     Conjunctiva/sclera: Conjunctivae normal.  Cardiovascular:     Rate and Rhythm: Normal rate and regular rhythm.     Heart sounds: Normal heart sounds. No murmur heard. Pulmonary:     Effort: Pulmonary effort is normal.     Breath sounds: Normal breath sounds. No wheezing.  Abdominal:     Palpations: Abdomen is soft.  Musculoskeletal:        General: Normal range of motion.  Skin:    General: Skin is warm.         Comments: Small lipomas noted on exam  Neurological:     Mental Status: She is alert. Mental status is at baseline.  Psychiatric:        Mood and Affect: Mood normal.        Behavior: Behavior normal.      Lab Results  Component Value Date   WBC 3.9 12/10/2023   HGB 11.7 12/10/2023   HCT 37.0 12/10/2023   PLT 218 12/10/2023   GLUCOSE 93 12/10/2023   CHOL 173 12/10/2023   TRIG 51 12/10/2023   HDL 71 12/10/2023   LDLCALC 92 12/10/2023   ALT 29 12/10/2023   AST 41 (H) 12/10/2023   NA 141 12/10/2023   K 4.9 12/10/2023   CL 104 12/10/2023   CREATININE 0.75 12/10/2023   BUN 15 12/10/2023   CO2 24 12/10/2023   TSH 3.210 11/12/2022   INR 1.0 05/11/2022   HGBA1C 5.9 (H) 12/10/2023      Assessment & Plan:  Lipoma of left upper extremity Assessment & Plan: Multiple small, soft, non-tender subcutaneous nodules on the left side, likely benign lipomas. Reassured about her benign nature. Removal not necessary unless symptomatic. - Monitor for changes in size, shape, or symptoms such as pain or irregular borders. - If changes  occur, consider ordering an ultrasound for further evaluation.   Intertrigo of genital labia Assessment & Plan: Purple macule on the vulva, non-tender, non-bleeding, unchanged for 2-3 months. Possible differential includes folliculitis, angioma, or bruise. Mild irritation present. - Prescribed nystatin  powder to apply once daily to keep the area dry and reduce irritation. - Monitor for changes in size, color, or symptoms. - If changes occur, consider referral to a dermatologist for evaluation and possible biopsy.   Orders: -     Nystatin ; Apply 1 Application topically 3 (three) times daily.  Dispense: 15 g; Refill: 0     Meds ordered this encounter  Medications   nystatin  (MYCOSTATIN /NYSTOP ) powder    Sig: Apply 1 Application topically 3 (three) times daily.  Dispense:  15 g    Refill:  0     Follow-up: Return if symptoms worsen or fail to improve.   I,Angela Taylor,acting as a Neurosurgeon for Harrie CHRISTELLA Cedar, FNP.,have documented all relevant documentation on the behalf of Harrie CHRISTELLA Cedar, FNP,as directed by  Harrie CHRISTELLA Cedar, FNP while in the presence of Harrie CHRISTELLA Cedar, FNP.   An After Visit Summary was printed and given to the patient.  I attest that I have reviewed this visit and agree with the plan scribed by my staff.   Harrie CHRISTELLA Cedar, FNP [facility] [phone]

## 2024-03-23 NOTE — Assessment & Plan Note (Signed)
 Purple macule on the vulva, non-tender, non-bleeding, unchanged for 2-3 months. Possible differential includes folliculitis, angioma, or bruise. Mild irritation present. - Prescribed nystatin  powder to apply once daily to keep the area dry and reduce irritation. - Monitor for changes in size, color, or symptoms. - If changes occur, consider referral to a dermatologist for evaluation and possible biopsy.

## 2024-03-23 NOTE — Patient Instructions (Signed)
  VISIT SUMMARY: During your visit, we discussed the soft, puffy areas on the left side of your body and the purple spot in your genital area. We examined these areas and provided guidance on how to monitor and manage them.  YOUR PLAN: -MULTIPLE LEFT-SIDED SUBCUTANEOUS LIPOMAS: You have multiple small, soft, non-tender nodules on the left side of your body, which are likely benign lipomas. Lipomas are non-cancerous growths of fatty tissue. We will monitor these for any changes in size, shape, or symptoms such as pain or irregular borders. If any changes occur, we may consider an ultrasound for further evaluation.  -VULVAR IRRITATION WITH PURPLE MACULE: You have a small purple spot on your vulva that has not changed in appearance for the past 2-3 months. This spot is likely benign and could be due to conditions like folliculitis, an angioma, or a bruise. We have prescribed nystatin  powder to apply once daily to keep the area dry and reduce irritation. Please monitor for any changes in size, color, or symptoms. If any changes occur, we may refer you to a dermatologist for further evaluation and possible biopsy.  INSTRUCTIONS: Please monitor the areas for any changes in size, shape, color, or symptoms. Apply the prescribed nystatin  powder once daily to the affected area to keep it dry and reduce irritation. If you notice any changes or have concerns, please schedule a follow-up appointment.                      Contains text generated by [employer].

## 2024-03-23 NOTE — Assessment & Plan Note (Signed)
 Multiple small, soft, non-tender subcutaneous nodules on the left side, likely benign lipomas. Reassured about her benign nature. Removal not necessary unless symptomatic. - Monitor for changes in size, shape, or symptoms such as pain or irregular borders. - If changes occur, consider ordering an ultrasound for further evaluation.

## 2024-03-24 DIAGNOSIS — M4316 Spondylolisthesis, lumbar region: Secondary | ICD-10-CM | POA: Diagnosis not present

## 2024-04-02 NOTE — Progress Notes (Signed)
   04/02/2024  Patient ID: Crystal Oneal, female   DOB: [birth year], 73 y.o.   MRN: [id number]  Pharmacy Quality Measure Review  This patient is appearing on a report for being at risk of failing the adherence measure for cholesterol (statin) medications this calendar year.   Medication: rosuvastatin  5mg   Last fill date: 03/05/24 for 30 day supply  Insurance report was not up to date. No action needed at this time.   Lang Sieve, PharmD, BCGP Clinical Pharmacist  [phone]

## 2024-04-14 ENCOUNTER — Other Ambulatory Visit: Payer: Self-pay | Admitting: Family Medicine

## 2024-06-09 NOTE — Assessment & Plan Note (Signed)
 SABRA

## 2024-06-09 NOTE — Progress Notes (Unsigned)
 Subjective:  Patient ID: Crystal Oneal, female    DOB: 1951/05/26  Age: 73 y.o. MRN: [id number]  No chief complaint on file.   HPI: Discussed the use of AI scribe software for clinical note transcription with the patient, who gave verbal consent to proceed.  History of Present Illness        03/23/2024   10:05 AM 12/10/2023    9:47 AM 08/20/2023    8:49 AM 05/14/2023    8:45 AM 11/12/2022    8:39 AM  Depression screen PHQ 2/9  Decreased Interest 0 0 0 0 0  Down, Depressed, Hopeless 0 0 0 0 0  PHQ - 2 Score 0 0 0 0 0  Altered sleeping 1 1 0 3   Tired, decreased energy 0 0 0 0   Change in appetite 0 0 0 0   Feeling bad or failure about yourself  0 0 0 0   Trouble concentrating 0 0 0 0   Moving slowly or fidgety/restless 0 0 0 0   Suicidal thoughts 0 0 0 0   PHQ-9 Score 1 1 0 3   Difficult doing work/chores Not difficult at all Not difficult at all Not difficult at all Not difficult at all         03/23/2024   10:04 AM  Fall Risk   Falls in the past year? 0  Number falls in past yr: 0  Injury with Fall? 0  Risk for fall due to : No Fall Risks  Follow up Falls evaluation completed    Patient Care Team: Sherre Clapper, MD as PCP - General (Family Medicine) Sheena Pugh, DO as PCP - Cardiology (Cardiology) Towana Charleston, MD (Gastroenterology) Mavis Purchase, MD as Consulting Physician (Neurosurgery)   Review of Systems  Current Outpatient Medications on File Prior to Visit  Medication Sig Dispense Refill   b complex vitamins tablet Take 1 tablet by mouth every evening.     calcium  citrate-vitamin D  (CITRACAL+D) 315-200 MG-UNIT tablet Take 2 tablets by mouth 2 (two) times daily.     cetirizine (ZYRTEC) 10 MG tablet Take 5-10 mg by mouth daily as needed for allergies (during spring months).     Cholecalciferol  (VITAMIN D ) 2000 units tablet Take 2,000 Units by mouth in the morning. (Patient not taking: Reported on 03/23/2024)     denosumab  (PROLIA ) 60 MG/ML SOSY  injection Inject 60 mg into the skin every 6 (six) months.     famotidine  (PEPCID ) 40 MG tablet Take 1 tablet (40 mg total) by mouth daily. 90 tablet 1   fluticasone  (FLONASE ) 50 MCG/ACT nasal spray Place 1 spray into both nostrils daily as needed for allergies or rhinitis.     Ginger, Zingiber officinalis, (GINGER ROOT) 550 MG CAPS Take 1 capsule by mouth in the morning and at bedtime.     Magnesium Glycinate 100 MG CAPS Take 1 Capful by mouth daily.     metoprolol  succinate (TOPROL -XL) 25 MG 24 hr tablet Take 1 tablet (25 mg total) by mouth daily. 90 tablet 1   nystatin  (MYCOSTATIN /NYSTOP ) powder Apply 1 Application topically 3 (three) times daily. 15 g 0   Omega-3 Fatty Acids (FISH OIL PO) Take 2,400 mg by mouth in the morning.     rosuvastatin  (CRESTOR ) 5 MG tablet Take 1 tablet (5 mg total) by mouth daily. 30 tablet 1   TURMERIC CURCUMIN PO Take 1,000 mg by mouth in the morning.     valsartan  (DIOVAN ) 320 MG tablet Take  1 tablet (320 mg total) by mouth daily. 90 tablet 1   vitamin B-12 (CYANOCOBALAMIN) 1000 MCG tablet Take 1,000 mcg by mouth in the morning.     vitamin C (ASCORBIC ACID) 250 MG tablet Take 250 mg by mouth 2 (two) times daily.     Current Facility-Administered Medications on File Prior to Visit  Medication Dose Route Frequency Provider Last Rate Last Admin   [START ON 06/11/2024] denosumab  (PROLIA ) injection 60 mg  60 mg Subcutaneous Once Sherre Clapper, MD       Past Medical History:  Diagnosis Date   Age-related osteoporosis without current pathological fracture    Arthritis    Cancer (HCC) 04/2020   SCC skin cancer removed from nose   Cervical stenosis of spine    C11-12   Dysphagia    Esophageal dilation x2   Essential (primary) hypertension    GERD (gastroesophageal reflux disease)    Hypertension    Mixed hyperlipidemia    Paroxysmal SVT (supraventricular tachycardia)    PONV (postoperative nausea and vomiting)    Spondylolisthesis of lumbar region    Past  Surgical History:  Procedure Laterality Date   ABDOMINAL HYSTERECTOMY     ANTERIOR AND POSTERIOR REPAIR N/A 03/20/2016   Procedure: REPAIR CYSTOCELE AND RECTOCELE;  Surgeon: Glendia Elizabeth, MD;  Location: [facility];  Service: Urology;  Laterality: N/A;   BACK SURGERY  2019   has hardware   BREAST BIOPSY Left 02/23/2022   COLONOSCOPY     ESOPHAGEAL DILATION  2005   x 2   MOHS SURGERY  04/2020   SCC skin cancer removed from nose   OPEN REDUCTION INTERNAL FIXATION (ORIF) DISTAL RADIAL FRACTURE Left 05/11/2022   Procedure: OPEN REDUCTION INTERNAL FIXATION (ORIF) LEFT DISTAL RADIUS FRACTURE;  Surgeon: Murrell Drivers, MD;  Location: [facility];  Service: Orthopedics;  Laterality: Left;  90 MIN   VAGINAL HYSTERECTOMY Bilateral 03/20/2016   Procedure: TOTAL VAGINAL HYSTERECTOMY WITH BILATERAL SALPINGO OOPHORECTOMY mccall coloplasty;  Surgeon: Dickie Carder, MD;  Location: [facility];  Service: Gynecology;  Laterality: Bilateral;   VAGINAL PROLAPSE REPAIR N/A 03/20/2016   Procedure: REPAIR VAULT PROLAPSE AND GRAFT;  Surgeon: Glendia Elizabeth, MD;  Location: [facility];  Service: Urology;  Laterality: N/A;    Family History  Problem Relation Age of Onset   Uterine cancer Mother    Breast cancer Mother    Lung disease Father    Breast cancer Sister    Breast cancer Maternal Aunt    Breast cancer Maternal Aunt    Breast cancer Maternal Aunt    Multiple myeloma Brother    Kidney cancer Brother    Social History   Socioeconomic History   Marital status: Married    Spouse name: Helayne   Number of children: 2   Years of education: Not on file   Highest education level: 12th grade  Occupational History   Occupation: Retired  Tobacco Use   Smoking status: Never   Smokeless tobacco: Never  Vaping Use   Vaping status: Never Used  Substance and Sexual Activity   Alcohol use: Not Currently    Alcohol/week: 1.0 standard drink of alcohol    Types: 1 Glasses of wine per week   Drug  use: Never   Sexual activity: Yes    Birth control/protection: Post-menopausal, Surgical    Comment: Hysterectomy  Other Topics Concern   Not on file  Social History Narrative   Not on file   Social Drivers of Health  Financial Resource Strain: Low Risk  (06/09/2024)   Overall Financial Resource Strain (CARDIA)    Difficulty of Paying Living Expenses: Not hard at all  Food Insecurity: No Food Insecurity (06/09/2024)   Hunger Vital Sign    Worried About Running Out of Food in the Last Year: Never true    Ran Out of Food in the Last Year: Never true  Transportation Needs: No Transportation Needs (06/09/2024)   PRAPARE - Administrator, Civil Service (Medical): No    Lack of Transportation (Non-Medical): No  Physical Activity: Sufficiently Active (06/09/2024)   Exercise Vital Sign    Days of Exercise per Week: 6 days    Minutes of Exercise per Session: 30 min  Stress: No Stress Concern Present (06/09/2024)   [employer] of Occupational Health - Occupational Stress Questionnaire    Feeling of Stress: Only a little  Social Connections: Socially Integrated (06/09/2024)   Social Connection and Isolation Panel    Frequency of Communication with Friends and Family: Three times a week    Frequency of Social Gatherings with Friends and Family: Twice a week    Attends Religious Services: More than 4 times per year    Active Member of [employer] or Organizations: Yes    Attends Engineer, Structural: More than 4 times per year    Marital Status: Married    Objective:  There were no vitals taken for this visit.     03/23/2024    9:55 AM 12/10/2023    9:45 AM 09/05/2023    8:14 AM  BP/Weight  Systolic BP 138 132 176  Diastolic BP 70 70 84  Wt. (Lbs) 106 107 111.2  BMI 20.03 kg/m2 20.22 kg/m2 21.01 kg/m2    Physical Exam  {Perform Simple Foot Exam  Perform Detailed exam:1} {Insert foot Exam (Optional):30965}   Lab Results  Component Value Date   WBC 3.9  12/10/2023   HGB 11.7 12/10/2023   HCT 37.0 12/10/2023   PLT 218 12/10/2023   GLUCOSE 93 12/10/2023   CHOL 173 12/10/2023   TRIG 51 12/10/2023   HDL 71 12/10/2023   LDLCALC 92 12/10/2023   ALT 29 12/10/2023   AST 41 (H) 12/10/2023   NA 141 12/10/2023   K 4.9 12/10/2023   CL 104 12/10/2023   CREATININE 0.75 12/10/2023   BUN 15 12/10/2023   CO2 24 12/10/2023   TSH 3.210 11/12/2022   INR 1.0 05/11/2022   HGBA1C 5.9 (H) 12/10/2023    Results for orders placed or performed in visit on 12/10/23  CBC with Differential/Platelet   Collection Time: 12/10/23 10:28 AM  Result Value Ref Range   WBC 3.9 3.4 - 10.8 x10E3/uL   RBC 4.20 3.77 - 5.28 x10E6/uL   Hemoglobin 11.7 11.1 - 15.9 g/dL   Hematocrit 62.9 65.9 - 46.6 %   MCV 88 79 - 97 fL   MCH 27.9 26.6 - 33.0 pg   MCHC 31.6 31.5 - 35.7 g/dL   RDW 87.2 88.2 - 84.5 %   Platelets 218 150 - 450 x10E3/uL   Neutrophils 44 Not Estab. %   Lymphs 40 Not Estab. %   Monocytes 12 Not Estab. %   Eos 3 Not Estab. %   Basos 1 Not Estab. %   Neutrophils Absolute 1.8 1.4 - 7.0 x10E3/uL   Lymphocytes Absolute 1.6 0.7 - 3.1 x10E3/uL   Monocytes Absolute 0.5 0.1 - 0.9 x10E3/uL   EOS (ABSOLUTE) 0.1 0.0 - 0.4 x10E3/uL  Basophils Absolute 0.0 0.0 - 0.2 x10E3/uL   Immature Granulocytes 0 Not Estab. %   Immature Grans (Abs) 0.0 0.0 - 0.1 x10E3/uL  Comprehensive metabolic panel with GFR   Collection Time: 12/10/23 10:28 AM  Result Value Ref Range   Glucose 93 70 - 99 mg/dL   BUN 15 8 - 27 mg/dL   Creatinine, Ser 9.24 0.57 - 1.00 mg/dL   eGFR 85 >40 fO/fpw/8.26   BUN/Creatinine Ratio 20 12 - 28   Sodium 141 134 - 144 mmol/L   Potassium 4.9 3.5 - 5.2 mmol/L   Chloride 104 96 - 106 mmol/L   CO2 24 20 - 29 mmol/L   Calcium  10.1 8.7 - 10.3 mg/dL   Total Protein 6.7 6.0 - 8.5 g/dL   Albumin 4.3 3.8 - 4.8 g/dL   Globulin, Total 2.4 1.5 - 4.5 g/dL   Bilirubin Total 0.2 0.0 - 1.2 mg/dL   Alkaline Phosphatase 32 (L) 44 - 121 IU/L   AST 41 (H) 0 -  40 IU/L   ALT 29 0 - 32 IU/L  Hemoglobin A1c   Collection Time: 12/10/23 10:28 AM  Result Value Ref Range   Hgb A1c MFr Bld 5.9 (H) 4.8 - 5.6 %   Est. average glucose Bld gHb Est-mCnc 123 mg/dL  Lipid panel   Collection Time: 12/10/23 10:28 AM  Result Value Ref Range   Cholesterol, Total 173 100 - 199 mg/dL   Triglycerides 51 0 - 149 mg/dL   HDL 71 >60 mg/dL   VLDL Cholesterol Cal 10 5 - 40 mg/dL   LDL Chol Calc (NIH) 92 0 - 99 mg/dL   Chol/HDL Ratio 2.4 0.0 - 4.4 ratio  .  Assessment & Plan:   Assessment & Plan Mixed hyperlipidemia     Prediabetes     Essential (primary) hypertension       There is no height or weight on file to calculate BMI.  Assessment and Plan Assessment & Plan      No orders of the defined types were placed in this encounter.   No orders of the defined types were placed in this encounter.      Follow-up: No follow-ups on file.  An After Visit Summary was printed and given to the patient.  Abigail Free, MD [facility] [phone]

## 2024-06-10 ENCOUNTER — Ambulatory Visit (INDEPENDENT_AMBULATORY_CARE_PROVIDER_SITE_OTHER): Admitting: Family Medicine

## 2024-06-10 ENCOUNTER — Encounter: Payer: Self-pay | Admitting: Family Medicine

## 2024-06-10 VITALS — BP 118/68 | HR 68 | Temp 98.2°F | Ht 61.0 in | Wt 104.0 lb

## 2024-06-10 DIAGNOSIS — R7303 Prediabetes: Secondary | ICD-10-CM

## 2024-06-10 DIAGNOSIS — Z23 Encounter for immunization: Secondary | ICD-10-CM

## 2024-06-10 DIAGNOSIS — I10 Essential (primary) hypertension: Secondary | ICD-10-CM

## 2024-06-10 DIAGNOSIS — E782 Mixed hyperlipidemia: Secondary | ICD-10-CM | POA: Diagnosis not present

## 2024-06-10 DIAGNOSIS — M810 Age-related osteoporosis without current pathological fracture: Secondary | ICD-10-CM | POA: Diagnosis not present

## 2024-06-10 LAB — POCT GLYCOSYLATED HEMOGLOBIN (HGB A1C): HbA1c POC (<> result, manual entry): 5.5 % (ref 4.0–5.6)

## 2024-06-10 MED ORDER — ROSUVASTATIN CALCIUM 5 MG PO TABS: 5.0000 mg | ORAL_TABLET | ORAL | 0 refills | Status: AC

## 2024-06-10 MED ORDER — DENOSUMAB 60 MG/ML ~~LOC~~ SOSY: 60.0000 mg | PREFILLED_SYRINGE | Freq: Once | SUBCUTANEOUS | Status: AC

## 2024-06-10 NOTE — Addendum Note (Signed)
 Addended by: FOREST BOWLING A on: 06/10/2024 09:10 AM   Modules accepted: Orders

## 2024-07-03 ENCOUNTER — Other Ambulatory Visit: Payer: Self-pay | Admitting: Family Medicine

## 2024-08-12 ENCOUNTER — Encounter: Payer: Self-pay | Admitting: Family Medicine

## 2024-08-19 ENCOUNTER — Encounter: Payer: Self-pay | Admitting: Cardiology

## 2024-08-20 ENCOUNTER — Encounter: Payer: Self-pay | Admitting: Family Medicine

## 2024-08-20 ENCOUNTER — Ambulatory Visit (INDEPENDENT_AMBULATORY_CARE_PROVIDER_SITE_OTHER): Payer: PPO | Admitting: Family Medicine

## 2024-08-20 VITALS — BP 126/75 | HR 65

## 2024-08-20 DIAGNOSIS — Z0000 Encounter for general adult medical examination without abnormal findings: Secondary | ICD-10-CM | POA: Diagnosis not present

## 2024-08-20 NOTE — Assessment & Plan Note (Signed)
 Up to date on screenings. Discussed importance of shingles and Tdap vaccine - advised to get at the pharmacy.  Things to do to keep yourself healthy  - Exercise at least 30-45 minutes a day, 3-4 days a week.  - Eat a low-fat diet with lots of fruits and vegetables, up to 7-9 servings per day.  - Seatbelts can save your life. Wear them always.  - Smoke detectors on every level of your home, check batteries every year.  - Eye Doctor - have an eye exam every 1-2 years  - Safe sex - if you may be exposed to STDs, use a condom.  - Alcohol -  If you drink, do it moderately, less than 2 drinks per day.  - Health Care Power of Attorney. Choose someone to speak for you if you are not able.  - Depression is common in our stressful world.If you're feeling down or losing interest in things you normally enjoy, please come in for a visit.  - Violence - If anyone is threatening or hurting you, please call immediately.

## 2024-08-20 NOTE — Progress Notes (Signed)
 "  Chief Complaint  Patient presents with   Medicare Wellness    AWV     Subjective:   Crystal Oneal is a 74 y.o. female who presents for a Medicare Annual Wellness Visit.  Visit info / Clinical Intake: Medicare Wellness Visit Type:: Subsequent Annual Wellness Visit Persons participating in visit and providing information:: patient Medicare Wellness Visit Mode:: Video Since this visit was completed virtually, some vitals may be partially provided or unavailable. Missing vitals are due to the limitations of the virtual format.: Documented vitals are patient reported Patient Location:: home Provider Location:: clinic Interpreter Needed?: No Pre-visit prep was completed: yes AWV questionnaire completed by patient prior to visit?: yes Date:: 08/19/24 Living arrangements:: lives with spouse/significant other Patient's Overall Health Status Rating: good Typical amount of pain: some Does pain affect daily life?: no Are you currently prescribed opioids?: no  Dietary Habits and Nutritional Risks How many meals a day?: 3 Eats fruit and vegetables daily?: yes Most meals are obtained by: preparing own meals In the last 2 weeks, have you had any of the following?: none Diabetic:: no  Functional Status Activities of Daily Living (to include ambulation/medication): Independent Ambulation: Independent Medication Administration: Independent Home Management (perform basic housework or laundry): Independent Manage your own finances?: yes Primary transportation is: driving Concerns about vision?: no *vision screening is required for WTM* Concerns about hearing?: no  Fall Screening Falls in the past year?: 0 Number of falls in past year: 0 Was there an injury with Fall?: 0 Fall Risk Category Calculator: 0 Patient Fall Risk Level: Low Fall Risk  Fall Risk Patient at Risk for Falls Due to: No Fall Risks Fall risk Follow up: Falls evaluation completed  Home and Transportation  Safety: All rugs have non-skid backing?: yes All stairs or steps have railings?: yes Grab bars in the bathtub or shower?: (!) no Have non-skid surface in bathtub or shower?: yes Good home lighting?: yes Regular seat belt use?: yes Hospital stays in the last year:: no  Cognitive Assessment Difficulty concentrating, remembering, or making decisions? : no Will 6CIT or Mini Cog be Completed: yes What year is it?: 0 points What month is it?: 0 points Give patient an address phrase to remember (5 components): [place]ill [place] About what time is it?: 0 points Count backwards from 20 to 1: 0 points Say the months of the year in reverse: 0 points Repeat the address phrase from earlier: 0 points 6 CIT Score: 0 points  Advance Directives (For Healthcare) Does Patient Have a Medical Advance Directive?: Yes Type of Advance Directive: Living will; Healthcare Power of Attorney Copy of Healthcare Power of Attorney in Chart?: No - copy requested Copy of Living Will in Chart?: No - copy requested  Reviewed/Updated  Reviewed/Updated: Reviewed All (Medical, Surgical, Family, Medications, Allergies, Care Teams, Patient Goals)    Allergies (verified) Pneumococcal vaccine, Pneumovax 23 [pneumococcal vac polyvalent], Betadine [povidone-iodine], Lipitor [atorvastatin ], Lisinopril, and Sulfa antibiotics   Current Medications (verified) Outpatient Encounter Medications as of 08/20/2024  Medication Sig   b complex vitamins tablet Take 1 tablet by mouth every evening.   calcium  citrate-vitamin D  (CITRACAL+D) 315-200 MG-UNIT tablet Take 2 tablets by mouth 2 (two) times daily.   cetirizine (ZYRTEC) 10 MG tablet Take 5-10 mg by mouth daily as needed for allergies (during spring months).   Cholecalciferol  (VITAMIN D ) 2000 units tablet Take 2,000 Units by mouth in the morning.   denosumab  (PROLIA ) 60 MG/ML SOSY injection Inject  60 mg into the skin every 6 (six) months.   famotidine  (PEPCID ) 40  MG tablet Take 1 tablet (40 mg total) by mouth daily.   fluticasone  (FLONASE ) 50 MCG/ACT nasal spray Place 1 spray into both nostrils daily as needed for allergies or rhinitis.   Ginger, Zingiber officinalis, (GINGER ROOT) 550 MG CAPS Take 1 capsule by mouth in the morning and at bedtime.   Magnesium Glycinate 100 MG CAPS Take 1 Capful by mouth daily.   metoprolol  succinate (TOPROL -XL) 25 MG 24 hr tablet Take 1 tablet (25 mg total) by mouth daily.   nystatin  (MYCOSTATIN /NYSTOP ) powder Apply 1 Application topically 3 (three) times daily.   Omega-3 Fatty Acids (FISH OIL PO) Take 2,400 mg by mouth in the morning.   rosuvastatin  (CRESTOR ) 5 MG tablet Take 1 tablet (5 mg total) by mouth every other day.   TURMERIC CURCUMIN PO Take 1,000 mg by mouth in the morning.   valsartan  (DIOVAN ) 320 MG tablet Take 1 tablet (320 mg total) by mouth daily.   vitamin B-12 (CYANOCOBALAMIN) 1000 MCG tablet Take 1,000 mcg by mouth in the morning.   vitamin C (ASCORBIC ACID) 250 MG tablet Take 250 mg by mouth 2 (two) times daily.   Facility-Administered Encounter Medications as of 08/20/2024  Medication   [START ON 12/07/2024] denosumab  (PROLIA ) injection 60 mg    History: Past Medical History:  Diagnosis Date   Age-related osteoporosis without current pathological fracture    Allergy    Arthritis    Cancer (HCC) 04/2020   SCC skin cancer removed from nose   Cervical stenosis of spine    C11-12   Dysphagia    Esophageal dilation x2   Essential (primary) hypertension    GERD (gastroesophageal reflux disease)    Hypertension    Mixed hyperlipidemia    Paroxysmal SVT (supraventricular tachycardia)    PONV (postoperative nausea and vomiting)    Spondylolisthesis of lumbar region    Past Surgical History:  Procedure Laterality Date   ABDOMINAL HYSTERECTOMY     ANTERIOR AND POSTERIOR REPAIR N/A 03/20/2016   Procedure: REPAIR CYSTOCELE AND RECTOCELE;  Surgeon: Glendia Elizabeth, MD;  Location: [facility];   Service: Urology;  Laterality: N/A;   BACK SURGERY  2019   has hardware   BREAST BIOPSY Left 02/23/2022   COLONOSCOPY     ESOPHAGEAL DILATION  2005   x 2   MOHS SURGERY  04/2020   SCC skin cancer removed from nose   OPEN REDUCTION INTERNAL FIXATION (ORIF) DISTAL RADIAL FRACTURE Left 05/11/2022   Procedure: OPEN REDUCTION INTERNAL FIXATION (ORIF) LEFT DISTAL RADIUS FRACTURE;  Surgeon: Murrell Drivers, MD;  Location: [facility];  Service: Orthopedics;  Laterality: Left;  90 MIN   VAGINAL HYSTERECTOMY Bilateral 03/20/2016   Procedure: TOTAL VAGINAL HYSTERECTOMY WITH BILATERAL SALPINGO OOPHORECTOMY mccall coloplasty;  Surgeon: Dickie Carder, MD;  Location: [facility];  Service: Gynecology;  Laterality: Bilateral;   VAGINAL PROLAPSE REPAIR N/A 03/20/2016   Procedure: REPAIR VAULT PROLAPSE AND GRAFT;  Surgeon: Glendia Elizabeth, MD;  Location: [facility];  Service: Urology;  Laterality: N/A;   Family History  Problem Relation Age of Onset   Uterine cancer Mother    Breast cancer Mother    Lung disease Father    Breast cancer Sister    Breast cancer Maternal Aunt    Breast cancer Maternal Aunt    Breast cancer Maternal Aunt    Multiple myeloma Brother    Kidney cancer Brother  Social History   Occupational History   Occupation: Retired  Tobacco Use   Smoking status: Never   Smokeless tobacco: Never  Vaping Use   Vaping status: Never Used  Substance and Sexual Activity   Alcohol use: Not Currently    Alcohol/week: 1.0 standard drink of alcohol    Types: 1 Glasses of wine per week   Drug use: Never   Sexual activity: Yes    Birth control/protection: Post-menopausal, Surgical    Comment: Hysterectomy   Tobacco Counseling Counseling given: Not Answered  SDOH Screenings   Food Insecurity: No Food Insecurity (08/20/2024)  Housing: Low Risk (08/20/2024)  Transportation Needs: No Transportation Needs (08/20/2024)  Utilities: Not At Risk (08/20/2024)  Alcohol Screen: Low Risk  (08/20/2023)  Depression (PHQ2-9): Low Risk (08/20/2024)  Financial Resource Strain: Low Risk (06/09/2024)  Physical Activity: Sufficiently Active (08/20/2024)  Social Connections: Socially Integrated (08/20/2024)  Stress: No Stress Concern Present (08/20/2024)  Tobacco Use: Low Risk (08/20/2024)  Health Literacy: Adequate Health Literacy (08/20/2024)   See flowsheets for full screening details  Depression Screen PHQ 2 & 9 Depression Scale- Over the past 2 weeks, how often have you been bothered by any of the following problems? Little interest or pleasure in doing things: 0 Feeling down, depressed, or hopeless (PHQ Adolescent also includes...irritable): 0 PHQ-2 Total Score: 0 Trouble falling or staying asleep, or sleeping too much: 1 Feeling tired or having little energy: 0 Poor appetite or overeating (PHQ Adolescent also includes...weight loss): 0 Feeling bad about yourself - or that you are a failure or have let yourself or your family down: 0 Trouble concentrating on things, such as reading the newspaper or watching television (PHQ Adolescent also includes...like school work): 0 Moving or speaking so slowly that other people could have noticed. Or the opposite - being so fidgety or restless that you have been moving around a lot more than usual: 0 Thoughts that you would be better off dead, or of hurting yourself in some way: 0 PHQ-9 Total Score: 1 If you checked off any problems, how difficult have these problems made it for you to do your work, take care of things at home, or get along with other people?: Not difficult at all  Depression Treatment Depression Interventions/Treatment : EYV7-0 Score <4 Follow-up Not Indicated     Goals Addressed             This Visit's Progress    DIET - EAT MORE FRUITS AND VEGETABLES   On track    Prevent falls   On track    Stay Active and Independent   On track            Objective:    Today's Vitals   08/20/24 0928  BP: 126/75  Pulse: 65    There is no height or weight on file to calculate BMI.  Hearing/Vision screen No results found. Immunizations and Health Maintenance Health Maintenance  Topic Date Due   Zoster Vaccines- Shingrix (1 of 2) Never done   COVID-19 Vaccine (5 - 2025-26 season) 04/13/2024   DTaP/Tdap/Td (2 - Td or Tdap) 08/13/2024   Mammogram  02/11/2025   Medicare Annual Wellness (AWV)  08/20/2025   Colonoscopy  01/18/2027   Influenza Vaccine  Completed   Bone Density Scan  Completed   Hepatitis C Screening  Completed   Meningococcal B Vaccine  Aged Out   Pneumococcal Vaccine: 50+ Years  Discontinued        Assessment/Plan:  This is a  routine wellness examination for [place]. Encounter for Medicare annual wellness exam Assessment & Plan: Up to date on screenings. Discussed importance of shingles and Tdap vaccine - advised to get at the pharmacy.  Things to do to keep yourself healthy  - Exercise at least 30-45 minutes a day, 3-4 days a week.  - Eat a low-fat diet with lots of fruits and vegetables, up to 7-9 servings per day.  - Seatbelts can save your life. Wear them always.  - Smoke detectors on every level of your home, check batteries every year.  - Eye Doctor - have an eye exam every 1-2 years  - Safe sex - if you may be exposed to STDs, use a condom.  - Alcohol -  If you drink, do it moderately, less than 2 drinks per day.  - Health Care Power of Attorney. Choose someone to speak for you if you are not able.  - Depression is common in our stressful world.If you're feeling down or losing interest in things you normally enjoy, please come in for a visit.  - Violence - If anyone is threatening or hurting you, please call immediately.      Patient Care Team: Sherre Clapper, MD as PCP - General (Family Medicine) Tobb, Kardie, DO as PCP - Cardiology (Cardiology) Towana Charleston, MD (Gastroenterology) Mavis Purchase, MD as Consulting Physician (Neurosurgery)  I have personally reviewed  and noted the following in the patients chart:   Medical and social history Use of alcohol, tobacco or illicit drugs  Current medications and supplements including opioid prescriptions. Functional ability and status Nutritional status Physical activity Advanced directives List of other physicians Hospitalizations, surgeries, and ER visits in previous 12 months Vitals Screenings to include cognitive, depression, and falls Referrals and appointments  No orders of the defined types were placed in this encounter.  In addition, I have reviewed and discussed with patient certain preventive protocols, quality metrics, and best practice recommendations. A written personalized care plan for preventive services as well as general preventive health recommendations were provided to patient.   Harrie Cedar, FNP [facility] [phone]   08/20/2024   Return in 1 year (on 08/20/2025).  After Visit Summary: (Declined) Due to this being a telephonic visit, with patients personalized plan was offered to patient but patient Declined AVS at this time   "

## 2024-09-03 ENCOUNTER — Other Ambulatory Visit: Payer: Self-pay | Admitting: Family Medicine

## 2024-09-03 DIAGNOSIS — I10 Essential (primary) hypertension: Secondary | ICD-10-CM

## 2024-10-20 ENCOUNTER — Ambulatory Visit: Admitting: Cardiology

## 2024-12-10 ENCOUNTER — Ambulatory Visit: Admitting: Family Medicine
